# Patient Record
Sex: Female | Born: 1935 | Race: White | Hispanic: No | Marital: Married | Smoking: Never smoker
Health system: Southern US, Community
[De-identification: ages and names within clinical notes are randomized; demographics above are authoritative.]

## PROBLEM LIST (undated history)

## (undated) DIAGNOSIS — I4892 Unspecified atrial flutter: Secondary | ICD-10-CM

## (undated) DIAGNOSIS — N183 Chronic kidney disease, stage 3 unspecified: Secondary | ICD-10-CM

## (undated) DIAGNOSIS — H3530 Unspecified macular degeneration: Secondary | ICD-10-CM

## (undated) DIAGNOSIS — I480 Paroxysmal atrial fibrillation: Secondary | ICD-10-CM

## (undated) DIAGNOSIS — I2510 Atherosclerotic heart disease of native coronary artery without angina pectoris: Secondary | ICD-10-CM

## (undated) DIAGNOSIS — I5042 Chronic combined systolic (congestive) and diastolic (congestive) heart failure: Secondary | ICD-10-CM

## (undated) DIAGNOSIS — I4510 Unspecified right bundle-branch block: Secondary | ICD-10-CM

## (undated) DIAGNOSIS — E785 Hyperlipidemia, unspecified: Secondary | ICD-10-CM

## (undated) DIAGNOSIS — I350 Nonrheumatic aortic (valve) stenosis: Secondary | ICD-10-CM

## (undated) DIAGNOSIS — M1990 Unspecified osteoarthritis, unspecified site: Secondary | ICD-10-CM

## (undated) DIAGNOSIS — G8929 Other chronic pain: Secondary | ICD-10-CM

## (undated) DIAGNOSIS — D0512 Intraductal carcinoma in situ of left breast: Principal | ICD-10-CM

## (undated) DIAGNOSIS — R54 Age-related physical debility: Secondary | ICD-10-CM

## (undated) DIAGNOSIS — F0390 Unspecified dementia without behavioral disturbance: Secondary | ICD-10-CM

## (undated) DIAGNOSIS — F03A0 Unspecified dementia, mild, without behavioral disturbance, psychotic disturbance, mood disturbance, and anxiety: Secondary | ICD-10-CM

## (undated) DIAGNOSIS — I442 Atrioventricular block, complete: Secondary | ICD-10-CM

## (undated) DIAGNOSIS — I10 Essential (primary) hypertension: Secondary | ICD-10-CM

## (undated) HISTORY — DX: Atrioventricular block, complete: I44.2

## (undated) HISTORY — DX: Intraductal carcinoma in situ of left breast: D05.12

## (undated) HISTORY — DX: Unspecified osteoarthritis, unspecified site: M19.90

## (undated) HISTORY — DX: Unspecified macular degeneration: H35.30

## (undated) HISTORY — DX: Hyperlipidemia, unspecified: E78.5

## (undated) HISTORY — PX: PILONIDAL CYST EXCISION: SHX744

## (undated) HISTORY — DX: Age-related physical debility: R54

## (undated) HISTORY — PX: HERNIA REPAIR: SHX51

## (undated) HISTORY — DX: Unspecified atrial flutter: I48.92

## (undated) HISTORY — DX: Essential (primary) hypertension: I10

## (undated) HISTORY — DX: Unspecified right bundle-branch block: I45.10

## (undated) HISTORY — PX: PACEMAKER INSERTION: SHX728

## (undated) HISTORY — DX: Nonrheumatic aortic (valve) stenosis: I35.0

## (undated) HISTORY — PX: EYE SURGERY: SHX253

---

## 1998-06-12 ENCOUNTER — Other Ambulatory Visit: Admission: RE | Admit: 1998-06-12 | Discharge: 1998-06-12 | Payer: Self-pay | Admitting: *Deleted

## 1998-07-04 ENCOUNTER — Other Ambulatory Visit: Admission: RE | Admit: 1998-07-04 | Discharge: 1998-07-04 | Payer: Self-pay | Admitting: *Deleted

## 2001-01-29 ENCOUNTER — Other Ambulatory Visit: Admission: RE | Admit: 2001-01-29 | Discharge: 2001-01-29 | Payer: Self-pay | Admitting: *Deleted

## 2003-01-17 ENCOUNTER — Encounter: Payer: Self-pay | Admitting: Neurology

## 2003-01-17 ENCOUNTER — Encounter: Payer: Self-pay | Admitting: Emergency Medicine

## 2003-01-17 ENCOUNTER — Emergency Department (HOSPITAL_COMMUNITY): Admission: EM | Admit: 2003-01-17 | Discharge: 2003-01-17 | Payer: Self-pay | Admitting: Emergency Medicine

## 2004-01-05 ENCOUNTER — Encounter: Admission: RE | Admit: 2004-01-05 | Discharge: 2004-01-05 | Payer: Self-pay | Admitting: Orthopedic Surgery

## 2004-01-26 ENCOUNTER — Encounter: Admission: RE | Admit: 2004-01-26 | Discharge: 2004-01-26 | Payer: Self-pay | Admitting: Orthopedic Surgery

## 2004-02-17 ENCOUNTER — Encounter: Admission: RE | Admit: 2004-02-17 | Discharge: 2004-02-17 | Payer: Self-pay | Admitting: Orthopedic Surgery

## 2004-03-22 ENCOUNTER — Encounter: Admission: RE | Admit: 2004-03-22 | Discharge: 2004-03-22 | Payer: Self-pay | Admitting: Orthopedic Surgery

## 2004-11-20 ENCOUNTER — Encounter: Admission: RE | Admit: 2004-11-20 | Discharge: 2004-11-20 | Payer: Self-pay | Admitting: Orthopedic Surgery

## 2004-12-10 ENCOUNTER — Encounter: Admission: RE | Admit: 2004-12-10 | Discharge: 2004-12-10 | Payer: Self-pay | Admitting: Orthopedic Surgery

## 2004-12-25 ENCOUNTER — Encounter: Admission: RE | Admit: 2004-12-25 | Discharge: 2004-12-25 | Payer: Self-pay | Admitting: Orthopedic Surgery

## 2005-06-21 ENCOUNTER — Encounter: Admission: RE | Admit: 2005-06-21 | Discharge: 2005-06-21 | Payer: Self-pay | Admitting: Orthopedic Surgery

## 2005-07-05 ENCOUNTER — Ambulatory Visit: Payer: Self-pay | Admitting: Gastroenterology

## 2005-07-09 ENCOUNTER — Encounter: Admission: RE | Admit: 2005-07-09 | Discharge: 2005-07-09 | Payer: Self-pay | Admitting: Orthopedic Surgery

## 2005-07-22 ENCOUNTER — Ambulatory Visit: Payer: Self-pay | Admitting: Gastroenterology

## 2005-07-30 ENCOUNTER — Ambulatory Visit: Payer: Self-pay | Admitting: Gastroenterology

## 2005-08-02 ENCOUNTER — Ambulatory Visit: Payer: Self-pay | Admitting: Gastroenterology

## 2005-08-12 ENCOUNTER — Ambulatory Visit: Payer: Self-pay | Admitting: Gastroenterology

## 2005-09-02 ENCOUNTER — Ambulatory Visit: Payer: Self-pay | Admitting: Gastroenterology

## 2005-09-06 ENCOUNTER — Ambulatory Visit: Payer: Self-pay | Admitting: Gastroenterology

## 2005-09-10 ENCOUNTER — Ambulatory Visit: Payer: Self-pay | Admitting: Gastroenterology

## 2005-09-24 ENCOUNTER — Ambulatory Visit: Payer: Self-pay | Admitting: Gastroenterology

## 2006-04-21 ENCOUNTER — Encounter: Admission: RE | Admit: 2006-04-21 | Discharge: 2006-04-21 | Payer: Self-pay | Admitting: Orthopedic Surgery

## 2006-12-02 ENCOUNTER — Encounter: Admission: RE | Admit: 2006-12-02 | Discharge: 2006-12-02 | Payer: Self-pay | Admitting: Orthopedic Surgery

## 2006-12-25 ENCOUNTER — Encounter: Admission: RE | Admit: 2006-12-25 | Discharge: 2006-12-25 | Payer: Self-pay | Admitting: Orthopedic Surgery

## 2008-01-14 ENCOUNTER — Encounter: Admission: RE | Admit: 2008-01-14 | Discharge: 2008-01-14 | Payer: Self-pay | Admitting: Orthopedic Surgery

## 2008-02-02 ENCOUNTER — Encounter: Admission: RE | Admit: 2008-02-02 | Discharge: 2008-02-02 | Payer: Self-pay | Admitting: Orthopedic Surgery

## 2008-11-25 ENCOUNTER — Encounter: Admission: RE | Admit: 2008-11-25 | Discharge: 2008-11-25 | Payer: Self-pay | Admitting: Family Medicine

## 2009-06-08 ENCOUNTER — Encounter: Admission: RE | Admit: 2009-06-08 | Discharge: 2009-06-08 | Payer: Self-pay | Admitting: Family Medicine

## 2009-06-15 ENCOUNTER — Observation Stay (HOSPITAL_COMMUNITY): Admission: EM | Admit: 2009-06-15 | Discharge: 2009-06-15 | Payer: Self-pay | Admitting: Emergency Medicine

## 2010-03-27 ENCOUNTER — Ambulatory Visit: Payer: Self-pay | Admitting: Cardiovascular Disease

## 2010-08-29 ENCOUNTER — Ambulatory Visit (INDEPENDENT_AMBULATORY_CARE_PROVIDER_SITE_OTHER): Payer: Federal, State, Local not specified - PPO | Admitting: Nurse Practitioner

## 2010-08-29 ENCOUNTER — Inpatient Hospital Stay (HOSPITAL_COMMUNITY)
Admission: EM | Admit: 2010-08-29 | Discharge: 2010-08-31 | DRG: 244 | Disposition: A | Discharge status: Home / Self Care | Payer: Medicare Other | Attending: Cardiovascular Disease | Admitting: Cardiovascular Disease

## 2010-08-29 ENCOUNTER — Inpatient Hospital Stay: Admission: AD | Admit: 2010-08-29 | Payer: Self-pay | Source: Ambulatory Visit | Admitting: Cardiovascular Disease

## 2010-08-29 ENCOUNTER — Emergency Department (HOSPITAL_COMMUNITY): Payer: Medicare Other

## 2010-08-29 DIAGNOSIS — I4510 Unspecified right bundle-branch block: Secondary | ICD-10-CM | POA: Diagnosis present

## 2010-08-29 DIAGNOSIS — E785 Hyperlipidemia, unspecified: Secondary | ICD-10-CM | POA: Diagnosis present

## 2010-08-29 DIAGNOSIS — I359 Nonrheumatic aortic valve disorder, unspecified: Secondary | ICD-10-CM | POA: Diagnosis present

## 2010-08-29 DIAGNOSIS — I10 Essential (primary) hypertension: Secondary | ICD-10-CM | POA: Diagnosis present

## 2010-08-29 DIAGNOSIS — Z7982 Long term (current) use of aspirin: Secondary | ICD-10-CM

## 2010-08-29 DIAGNOSIS — I442 Atrioventricular block, complete: Principal | ICD-10-CM | POA: Diagnosis present

## 2010-08-29 DIAGNOSIS — R55 Syncope and collapse: Secondary | ICD-10-CM

## 2010-08-29 LAB — CBC
HCT: 38.6 % (ref 36.0–46.0)
Hemoglobin: 13 g/dL (ref 12.0–15.0)
RDW: 14.5 % (ref 11.5–15.5)
WBC: 8.9 10*3/uL (ref 4.0–10.5)

## 2010-08-29 LAB — CK TOTAL AND CKMB (NOT AT ARMC)
Relative Index: INVALID (ref 0.0–2.5)
Total CK: 67 U/L (ref 7–177)

## 2010-08-29 LAB — DIFFERENTIAL
Basophils Absolute: 0 10*3/uL (ref 0.0–0.1)
Eosinophils Relative: 2 % (ref 0–5)
Lymphocytes Relative: 16 % (ref 12–46)
Neutro Abs: 6.6 10*3/uL (ref 1.7–7.7)

## 2010-08-29 LAB — BASIC METABOLIC PANEL
Calcium: 9.5 mg/dL (ref 8.4–10.5)
Creatinine, Ser: 1.24 mg/dL — ABNORMAL HIGH (ref 0.4–1.2)
GFR calc Af Amer: 50 mL/min — ABNORMAL LOW (ref >60)
GFR calc non Af Amer: 41 mL/min — ABNORMAL LOW (ref >60)

## 2010-08-29 LAB — TROPONIN I: Troponin I: 0.03 ng/mL (ref 0.00–0.06)

## 2010-08-30 DIAGNOSIS — I442 Atrioventricular block, complete: Secondary | ICD-10-CM

## 2010-08-30 HISTORY — PX: INSERT / REPLACE / REMOVE PACEMAKER: SUR710

## 2010-08-30 LAB — BASIC METABOLIC PANEL
CO2: 25 mEq/L (ref 19–32)
Calcium: 9.3 mg/dL (ref 8.4–10.5)
Chloride: 106 mEq/L (ref 96–112)
Creatinine, Ser: 1.13 mg/dL (ref 0.4–1.2)
Glucose, Bld: 100 mg/dL — ABNORMAL HIGH (ref 70–99)

## 2010-08-30 LAB — CBC
HCT: 37.2 % (ref 36.0–46.0)
Hemoglobin: 12.3 g/dL (ref 12.0–15.0)
MCH: 28.5 pg (ref 26.0–34.0)
MCHC: 33.1 g/dL (ref 30.0–36.0)
RBC: 4.32 MIL/uL (ref 3.87–5.11)

## 2010-08-31 ENCOUNTER — Inpatient Hospital Stay (HOSPITAL_COMMUNITY): Payer: Medicare Other

## 2010-09-04 ENCOUNTER — Encounter: Payer: Self-pay | Admitting: Internal Medicine

## 2010-09-06 NOTE — Discharge Summary (Signed)
Erin Charles, Erin Charles             ACCOUNT NO.:  [id number]  MEDICAL RECORD NO.:  [id number]           PATIENT TYPE:  I  LOCATION:  2923                         FACILITY:  [facility]  PHYSICIAN:  Vesta Mixer, M.D. DATE OF BIRTH:  07-Oct-1922  DATE OF ADMISSION:  08/29/2010 DATE OF DISCHARGE:  08/31/2010                              DISCHARGE SUMMARY   PRIMARY CARDIOLOGIST:  Vesta Mixer, MD  ELECTROPHYSIOLOGIST:  Doylene Canning. Ladona Ridgel, MD  DISCHARGE DIAGNOSIS:  Complete heart block (symptomatic). a.  Status post St. Jude dual-chamber pacemaker on August 30, 2010.  SECONDARY DIAGNOSES: 1. Hypertension. 2. Dyslipidemia. 3. Aortic stenosis, severe.  ALLERGIES AND INTOLERANCES: 1. PENICILLINS (rash). 2. MACROLIDES (rash). 3. TOLMETIN (rash).  PROCEDURES: 1. EKG on August 29, 2010:  Marked sinus bradycardia with short PR     interval and right bundle-branch block, 40 bpm, no significant ST-T     wave changes, no significant Q-waves, normal axis, no evidence of     hypertrophy, PR 80, QRS 128, and QTc of 430. 2. Chest x-ray on August 29, 2010:  Mild cardiomegaly. 3. Insertion of St. Jude dual-chamber pacemaker on August 30, 2010. 4. Chest x-ray on August 31, 2010:  Left pacer with dual lead in right     atrium and right ventricle.  No pneumothorax.  Small bilateral     effusion. 5. EKG August 31, 2010:  A sensed, V paced, 73 bpm.  PR 186, QRS 154,     and QTc of 533.  HISTORY OF PRESENT ILLNESS:  Ms. Erin Charles is an 77 year old Caucasian female with the above-noted complex medical history who was noted to have 2:1 heart block and right bundle-branch block who presented to the hospital after a week of fatigue, subsequently found to have complete heart block with narrow ventricular escape at 32-36 bpm.  She subsequently was scheduled for permanent pacemaker insertion.  HOSPITAL COURSE:  The patient was admitted and underwent procedures as described above.  She tolerated them well  without significant complications.  After her pacemaker implantation, she was kept overnight for observation.  She was seen in the morning by her primary cardiologist and chest x-ray was reviewed as well as the pacemaker interrogation and the patient was deemed stable for discharge.  Her home medication list has not changed.  She will be seen for a wound check at [facility] on September 17, 2010, at 11:30 a.m.  She will follow up with her primary cardiologist as previously scheduled at [facility] on September 24, 2010.  She will then see her electrophysiologist for a 39-month postpacemaker implantation followup on December 05, 2010, at 3 p.m.  At the time of discharge, the patient received her old medication list, followup instructions, post pacemaker insertion instructions, and all questions and concerns were addressed prior to leaving the hospital.  DISCHARGE LABORATORY DATA:  WBC 7.8, HGB 12.3, HCT 37.2, and PLT count 188.  WBC differential on admission was within normal limits.  Sodium 140, potassium 3.5, chloride 106, bicarb 25, BUN 27, creatinine 1.13, and glucose 100.  Calcium 9.3.  Full set of enzymes negative.  TSH  1.502.  MRSA negative by PCR.  FOLLOWUP PLANS AND APPOINTMENTS:  Please see hospital course.  DISCHARGE MEDICATIONS: 1. Acetaminophen 325 mg 1-2 tablets p.o. q.4 h. p.r.n. (do not take     within 4 hours of home med, hydrocodone/APAP. 2. Accupril 40 mg 1 tablet p.o. b.i.d. 3. Alprazolam 0.25 mg 1 tablet p.o. q.8 h. P.r.n. 4. Enteric-coated aspirin 325 mg p.o. daily. 5. Hydrocodone/APAP 5/500 mg 1 tablet p.o. daily p.r.n. 6. Hydrochlorothiazide 25 mg 1 tablet p.o. daily. 7. Iron with vitamin C 1 tablet p.o. daily. 8. Lunesta 5 mg p.o. nightly p.r.n. 9. Premarin 0.3 mg 1 tablet p.o. daily. 10.Provera 2.5 mg 1 tablet p.o. daily. 11.Toprol-XL 50 mg 1 tablet p.o. daily. 12.Simvastatin 40 mg 1 tablet p.o. nightly.  DURATION OF DISCHARGE ENCOUNTER  INCLUDING PHYSICIAN TIME:  35 minutes.     Jarrett Ables, PAC   ______________________________ Vesta Mixer, M.D.    MS/MEDQ  D:  08/31/2010  T:  09/01/2010  Job:  [id number]  cc:   Doylene Canning. Ladona Ridgel, MD  Electronically Signed by Jarrett Ables PAC on 09/04/2010 04:46:03 PM Electronically Signed by Kristeen Miss M.D. on 09/05/2010 07:03:35 AM

## 2010-09-11 NOTE — Miscellaneous (Signed)
Summary: Device preload  Clinical Lists Changes  Observations: Added new observation of PPM INDICATN: CHB (09/04/2010 7:11) Added new observation of MAGNET RTE: BOL 100 ERI 85 (09/04/2010 7:11) Added new observation of PPMLEADSTAT2: active (09/04/2010 7:11) Added new observation of PPMLEADSER2: [id number] (09/04/2010 7:11) Added new observation of PPMLEADMOD2: 2088TC (09/04/2010 7:11) Added new observation of PPMLEADLOC2: RV (09/04/2010 7:11) Added new observation of PPMLEADSTAT1: active (09/04/2010 7:11) Added new observation of PPMLEADSER1: [id number] (09/04/2010 7:11) Added new observation of PPMLEADMOD1: 2088TC (09/04/2010 7:11) Added new observation of PPMLEADLOC1: RA (09/04/2010 7:11) Added new observation of PPM IMP MD: Lewayne Bunting, MD (09/04/2010 7:11) Added new observation of PPMLEADDOI2: 08/30/2010 (09/04/2010 7:11) Added new observation of PPMLEADDOI1: 08/30/2010 (09/04/2010 7:11) Added new observation of PPM DOI: 08/30/2010 (09/04/2010 7:11) Added new observation of PPM SERL#: [id number]  (09/04/2010 7:11) Added new observation of PPM MODL#: [id number]  (09/04/2010 7:84) Added new observation of PACEMAKERMFG: St Jude  (09/04/2010 7:11) Added new observation of PACEMAKER MD: Lewayne Bunting, MD  (09/04/2010 7:11)      PPM Specifications Following MD:  Lewayne Bunting, MD     PPM Vendor:  St Jude     PPM Model Number:  [id number]     PPM Serial Number:  [id number] PPM DOI:  08/30/2010     PPM Implanting MD:  Lewayne Bunting, MD  Lead 1    Location: RA     DOI: 08/30/2010     Model #: [id number]     Serial #: [id number]     Status: active Lead 2    Location: RV     DOI: 08/30/2010     Model #: [id number]     Serial #: [id number]     Status: active  Magnet Response Rate:  BOL 100 ERI 85  Indications:  CHB

## 2010-09-12 NOTE — Op Note (Signed)
NAMEMAMIE, HUNDERTMARK             ACCOUNT NO.:  [id number]  MEDICAL RECORD NO.:  [id number]           PATIENT TYPE:  I  LOCATION:  2923                         FACILITY:  [facility]  PHYSICIAN:  Doylene Canning. Ladona Ridgel, MD    DATE OF BIRTH:  25-Jul-1922  DATE OF PROCEDURE:  08/30/2010 DATE OF DISCHARGE:                              OPERATIVE REPORT   PROCEDURE PERFORMED:  Insertion of a dual-chamber pacemaker.  INDICATIONS:  Complete heart block (symptomatic).  INTRODUCTION:  The patient is an 77 year old woman who has a history of a 2:1 heart block and right bundle-branch block who presented to the hospital with 1 week of fatigue and weakness.  She was subsequently found to have complete heart block with a narrow ventricular escape at 32 to 36 beats per minute.  She is now referred for permanent pacemaker insertion.  PROCEDURE:  After informed consent was obtained, the patient was taken to the diagnostic EP lab in a fasting state.  After usual preparation and draping, intravenous fentanyl and midazolam was given for sedation. 30 mL of lidocaine was infiltrated into the left infraclavicular region. A 5-cm incision was carried out over this region.  Electrocautery was utilized to dissect down to the fascial plane.  The left subclavian vein was then punctured x2 and the St. Jude model 2080T 52-cm active fixation pacing lead, serial number [id number] was advanced into the right ventricle.  The St. Jude model 2088T 46-cm active fixation pacing lead serial number [id number] was advanced to right atrium.  Mapping was then carried out in the right ventricle.  At the final site on the RV apical septum, the R-waves measured 9 mV, pacing impedance was 740 ohms, and the threshold was less than a volt at 0.5 milliseconds.  10-volt pacing did not stimulate the diaphragm.  With the ventricular lead in satisfactory position, attention was then turned to placing the atrial lead, placed in the anterolateral  portion of the right atrium where P- waves measured 2 mV.  The pacing threshold was less than 1 volt at 0.5 milliseconds and the impedance was 670 ohms.  There was a large injury current with active fixation of both atrial and ventricular leads.  With the above leads in satisfactory position, it was secured to the subpectoralis fascia with a figure-of-eight silk suture.  Sewing sleeve was secured with silk suture.  Electrocautery was then utilized to make subcutaneous pocket.  Antibiotic irrigation was utilized to irrigate the pocket.  Electrocautery was utilized to assure hemostasis.  The St. Jude Accent DR/RF dual-chamber pacemaker serial number [id number] was connected to the atrial and ventricular leads and placed back in the subcutaneous pocket where it was secured with silk suture.  The pocket was again irrigated with antibiotic solution and the incision closed with 2-0 and 3-0 Vicryl.  Benzoin and Steri-Strips were painted on the skin.  A pressure dressing was applied and the patient was returned to her room in satisfactory condition.  COMPLICATIONS:  There were no immediate procedural complications.  RESULTS:  This demonstrates successful implantation of the St. Jude dual- chamber pacemaker.  The patient was symptomatic with complete  heart block.     Doylene Canning. Ladona Ridgel, MD     GWT/MEDQ  D:  08/30/2010  T:  08/31/2010  Job:  [id number]  cc:   Caryn Bee L. Little, M.D. Vesta Mixer, M.D.  Electronically Signed by Lewayne Bunting MD on 09/12/2010 04:38:31 PM

## 2010-09-17 ENCOUNTER — Encounter: Payer: Self-pay | Admitting: Internal Medicine

## 2010-09-17 ENCOUNTER — Ambulatory Visit (INDEPENDENT_AMBULATORY_CARE_PROVIDER_SITE_OTHER): Payer: Federal, State, Local not specified - PPO

## 2010-09-17 DIAGNOSIS — I495 Sick sinus syndrome: Secondary | ICD-10-CM

## 2010-09-20 ENCOUNTER — Encounter: Payer: Self-pay | Admitting: Cardiovascular Disease

## 2010-09-27 ENCOUNTER — Ambulatory Visit (INDEPENDENT_AMBULATORY_CARE_PROVIDER_SITE_OTHER): Payer: Federal, State, Local not specified - PPO | Admitting: Cardiovascular Disease

## 2010-09-27 ENCOUNTER — Encounter: Payer: Self-pay | Admitting: Cardiovascular Disease

## 2010-09-27 VITALS — BP 160/68 | HR 52 | Wt 138.0 lb

## 2010-09-27 DIAGNOSIS — I359 Nonrheumatic aortic valve disorder, unspecified: Secondary | ICD-10-CM

## 2010-09-27 DIAGNOSIS — I119 Hypertensive heart disease without heart failure: Secondary | ICD-10-CM

## 2010-09-27 DIAGNOSIS — I350 Nonrheumatic aortic (valve) stenosis: Secondary | ICD-10-CM | POA: Insufficient documentation

## 2010-09-27 DIAGNOSIS — Z950 Presence of cardiac pacemaker: Secondary | ICD-10-CM | POA: Insufficient documentation

## 2010-09-27 DIAGNOSIS — I10 Essential (primary) hypertension: Secondary | ICD-10-CM | POA: Insufficient documentation

## 2010-09-27 MED ORDER — QUINAPRIL HCL 40 MG PO TABS: 40.0000 mg | ORAL_TABLET | Freq: Two times a day (BID) | ORAL | Status: DC

## 2010-09-27 NOTE — Assessment & Plan Note (Signed)
Stable

## 2010-09-27 NOTE — Assessment & Plan Note (Signed)
Since presents for followup of her pacemaker. She's doing fairly well. The pacemaker site is clean and dry. She will be followed in pacemaker clinic.

## 2010-09-27 NOTE — Procedures (Signed)
Summary: wound check/st jude/ per matt call 08/31/10=mj  Medications Added TOPROL XL 50 MG XR24H-TAB (METOPROLOL SUCCINATE) Take 1 tablet by mouth once daily ACCUPRIL 40 MG TABS (QUINAPRIL HCL) Take 1 tablet by mouth two times a day HYDROCHLOROTHIAZIDE 25 MG TABS (HYDROCHLOROTHIAZIDE) Take 1 tablet by mouth once daily ASPIR-LOW 81 MG TBEC (ASPIRIN) Take 1 tablet by mouth once daily PREMARIN 0.3 MG TABS (ESTROGENS CONJUGATED) Take 1 tablet by mouth once daily PROVERA 2.5 MG TABS (MEDROXYPROGESTERONE ACETATE) Take 1 tablet by mouth once daily ZOCOR 40 MG TABS (SIMVASTATIN) Take 1 tablet by mouth at bedtime HYDROCODONE-ACETAMINOPHEN 5-500 MG TABS (HYDROCODONE-ACETAMINOPHEN) Take as needed for arthritic pain NORTRIPTYLINE HCL 10 MG CAPS (NORTRIPTYLINE HCL) Take 1 capsule by mouth once daily LUNESTA 3 MG TABS (ESZOPICLONE) Take 1 tablet by mouth at bedtime ALPRAZOLAM 0.25 MG TABS (ALPRAZOLAM) Take 1 tablet by mouth at bedtime      Allergies Added: ! PCN ! MACRODANTIN ! TOLECTIN  Current Medications (verified): 1)  Toprol Xl 50 Mg Xr24h-Tab (Metoprolol Succinate) .... Take 1 Tablet By Mouth Once Daily 2)  Accupril 40 Mg Tabs (Quinapril Hcl) .... Take 1 Tablet By Mouth Two Times A Day 3)  Hydrochlorothiazide 25 Mg Tabs (Hydrochlorothiazide) .... Take 1 Tablet By Mouth Once Daily 4)  Aspir-Low 81 Mg Tbec (Aspirin) .... Take 1 Tablet By Mouth Once Daily 5)  Premarin 0.3 Mg Tabs (Estrogens Conjugated) .... Take 1 Tablet By Mouth Once Daily 6)  Provera 2.5 Mg Tabs (Medroxyprogesterone Acetate) .... Take 1 Tablet By Mouth Once Daily 7)  Zocor 40 Mg Tabs (Simvastatin) .... Take 1 Tablet By Mouth At Bedtime 8)  Hydrocodone-Acetaminophen 5-500 Mg Tabs (Hydrocodone-Acetaminophen) .... Take As Needed For Arthritic Pain 9)  Nortriptyline Hcl 10 Mg Caps (Nortriptyline Hcl) .... Take 1 Capsule By Mouth Once Daily 10)  Lunesta 3 Mg Tabs (Eszopiclone) .... Take 1 Tablet By Mouth At Bedtime 11)  Alprazolam  0.25 Mg Tabs (Alprazolam) .... Take 1 Tablet By Mouth At Bedtime  Allergies (verified): 1)  ! Pcn 2)  ! Macrodantin 3)  ! Tolectin  PPM Specifications Following MD:  Lewayne Bunting, MD     PPM Vendor:  St Jude     PPM Model Number:  [id number]     PPM Serial Number:  [id number] PPM DOI:  08/30/2010     PPM Implanting MD:  Lewayne Bunting, MD  Lead 1    Location: RA     DOI: 08/30/2010     Model #: [id number]     Serial #: [id number]     Status: active Lead 2    Location: RV     DOI: 08/30/2010     Model #: [id number]     Serial #: [id number]     Status: active  Magnet Response Rate:  BOL 100 ERI 85  Indications:  CHB  Tech Comments:  meds reviewed. see paceart report. Vella Kohler  September 17, 2010 12:31 PM   Patient Instructions: 1)  Your physician recommends that you schedule a follow-up appointment in: 12-18-10 @ 3pm with Dr Ladona Ridgel.

## 2010-09-27 NOTE — Progress Notes (Signed)
History of Present Illness:   Current Outpatient Prescriptions on File Prior to Visit  Medication Sig Dispense Refill  . ALPRAZolam (XANAX) 0.25 MG tablet Take 0.25 mg by mouth every 8 (eight) hours as needed.        Marland Kitchen aspirin 325 MG tablet Take 325 mg by mouth daily.        Marland Kitchen estrogens, conjugated, (PREMARIN) 0.3 MG tablet Take 0.3 mg by mouth daily. Take daily for 21 days then do not take for 7 days.       . hydrochlorothiazide 25 MG tablet Take 25 mg by mouth daily.       . hydrocodone-acetaminophen (LORCET-HD) 5-500 MG per capsule Take 1 capsule by mouth as needed.        Marland Kitchen IRON-VITAMIN C PO Take 1 tablet by mouth daily.        . medroxyPROGESTERone (PROVERA) 2.5 MG tablet Take 2.5 mg by mouth daily.        . metoprolol (TOPROL-XL) 50 MG 24 hr tablet Take 50 mg by mouth daily.       . simvastatin (ZOCOR) 40 MG tablet Take 40 mg by mouth at bedtime.       . Eszopiclone (LUNESTA PO) Take 1 tablet by mouth at bedtime as needed.        . ISOtretinoin (ACCUTANE) 40 MG capsule Take 40 mg by mouth 2 (two) times daily.        accupril 40 mg twice a day  Allergies  Allergen Reactions  . Macrodantin   . Nitrofurantoin   . Penicillins   . Tolectin (Tolmetin Sodium)     Past Medical History  Diagnosis Date  . HTN (hypertension)   . Dyslipidemia   . Severe aortic stenosis   . Complete heart block   . RBBB (right bundle branch block)     Past Surgical History  Procedure Date  . Insert / replace / remove pacemaker 08/30/10    DUAL CHAMBER/ST. JUDE  . Pilonidal cyst excision   . Hernia repair     History  Smoking status  . Never Smoker   Smokeless tobacco  . Not on file    History  Alcohol Use No    No family history on file.  Review of Systems: The review of systems is Reviewed in the history present illness.  All other systems were reviewed and are negative.  Physical Exam: BP 160/68  Pulse 52  Wt 138 lb (62.596 kg) The patient is alert and oriented x 3.  The mood  and affect are normal.  The skin is warm and dry.  Color is normal.  The HEENT exam reveals that the sclera are nonicteric.  The mucous membranes are moist.  The carotids are 2+ without bruits.  There is no thyromegaly.  There is no JVD.  The lungs are clear.  The chest wall is non tender.  The heart exam reveals a regular rate with a normal S1 and S2.  There is a soft systolic murmur.  Her pacemaker site is clean and well-healed. There is a small suture coming from the medial aspect of the wound. This suture was based with Betadine and was clipped off at the skin.  The PMI is not displaced.   Abdominal exam reveals good bowel sounds.  There is no guarding or rebound.  There is no hepatosplenomegaly or tenderness.  There are no masses.  Exam of the legs reveal no clubbing, cyanosis, or edema.  The legs are without  rashes.  The distal pulses are intact.  Cranial nerves II - XII are intact.  Motor and sensory functions are intact.  The gait is normal.  Assessment / Plan:

## 2010-09-27 NOTE — Patient Instructions (Signed)
Avoid salt

## 2010-09-27 NOTE — Assessment & Plan Note (Signed)
Her blood pressure is moderately elevated today. She has not been watching her salt intake. Will have her limit her salt intake. I'll see her again in 3 months. If her blood pressure remains elevated then we'll need to change her Accupril to a stronger medication.

## 2010-10-01 LAB — CARDIAC PANEL(CRET KIN+CKTOT+MB+TROPI)
CK, MB: 5.4 ng/mL — ABNORMAL HIGH (ref 0.3–4.0)
CK, MB: 6.8 ng/mL — ABNORMAL HIGH (ref 0.3–4.0)
Relative Index: 5.4 — ABNORMAL HIGH (ref 0.0–2.5)
Total CK: 125 U/L (ref 7–177)
Total CK: 132 U/L (ref 7–177)
Troponin I: 0.03 ng/mL (ref 0.00–0.06)

## 2010-10-01 LAB — LIPID PANEL
Cholesterol: 153 mg/dL (ref 0–200)
LDL Cholesterol: 54 mg/dL (ref 0–99)
Total CHOL/HDL Ratio: 2.1 RATIO
VLDL: 25 mg/dL (ref 0–40)

## 2010-10-01 LAB — HEMOGLOBIN A1C: Mean Plasma Glucose: 128 mg/dL

## 2010-10-01 LAB — CK TOTAL AND CKMB (NOT AT ARMC): Relative Index: 5 — ABNORMAL HIGH (ref 0.0–2.5)

## 2010-10-02 LAB — BASIC METABOLIC PANEL
BUN: 22 mg/dL (ref 6–23)
CO2: 25 mEq/L (ref 19–32)
GFR calc non Af Amer: 60 mL/min — ABNORMAL LOW (ref >60)
Glucose, Bld: 163 mg/dL — ABNORMAL HIGH (ref 70–99)
Potassium: 3.4 mEq/L — ABNORMAL LOW (ref 3.5–5.1)
Sodium: 137 mEq/L (ref 135–145)

## 2010-10-02 LAB — HEPATIC FUNCTION PANEL
ALT: 21 U/L (ref 0–35)
AST: 26 U/L (ref 0–37)
Albumin: 4 g/dL (ref 3.5–5.2)
Alkaline Phosphatase: 77 U/L (ref 39–117)
Total Bilirubin: 0.4 mg/dL (ref 0.3–1.2)

## 2010-10-02 LAB — CBC
HCT: 38.7 % (ref 36.0–46.0)
Hemoglobin: 13.1 g/dL (ref 12.0–15.0)
MCHC: 33.8 g/dL (ref 30.0–36.0)
MCV: 89.7 fL (ref 78.0–100.0)
Platelets: 251 10*3/uL (ref 150–400)
RDW: 15.6 % — ABNORMAL HIGH (ref 11.5–15.5)

## 2010-10-02 LAB — URINALYSIS, ROUTINE W REFLEX MICROSCOPIC
Nitrite: NEGATIVE
Specific Gravity, Urine: 1.021 (ref 1.005–1.030)
pH: 5.5 (ref 5.0–8.0)

## 2010-10-02 LAB — URINE CULTURE

## 2010-10-02 LAB — DIFFERENTIAL
Basophils Absolute: 0 10*3/uL (ref 0.0–0.1)
Basophils Relative: 0 % (ref 0–1)
Eosinophils Absolute: 0.1 10*3/uL (ref 0.0–0.7)
Eosinophils Relative: 1 % (ref 0–5)
Monocytes Absolute: 0.9 10*3/uL (ref 0.1–1.0)

## 2010-10-02 LAB — POCT CARDIAC MARKERS: Troponin i, poc: <0.05 ng/mL (ref 0.00–0.09)

## 2010-10-02 LAB — URINE MICROSCOPIC-ADD ON

## 2010-10-02 LAB — PROTIME-INR: Prothrombin Time: 12.9 seconds (ref 11.6–15.2)

## 2010-11-16 NOTE — Consult Note (Signed)
Erin Charles, Erin Charles                         ACCOUNT NO.:  [id number]   MEDICAL RECORD NO.:  [id number]                   PATIENT TYPE:  EMS   LOCATION:  ED                                   FACILITY:  [facility]   PHYSICIAN:  Gustavus Messing. Orlin Hilding, M.D.          DATE OF BIRTH:  1923-02-21   DATE OF CONSULTATION:  01/17/2003  DATE OF DISCHARGE:                                   CONSULTATION   PRIMARY PHYSICIAN:  The patient's primary physician, I believe, is Dr.  Wylene Simmer.   CHIEF COMPLAINT:  Left arm numbness intermittently.   HISTORY OF PRESENT ILLNESS:  Erin Charles is a 77 year old right-handed white  woman with a history of hyperlipidemia and hypertension, both controlled.  She presents now with a six-day history of intermittent left arm numbness.  This has been present since Wednesday of last week, occurring for just a few  minutes at a time, four to five minutes at a time, three to four times a day  in a strip-like distribution from her shoulder to her wrist, not  circumferential.  There is no involvement of face, torso, or leg.  No  weakness.  No symptoms on the opposite side.  No speech or language or  vision problems.   REVIEW OF SYSTEMS:  Positive for occasional headaches but not associated  with the current symptom complex.  No chest pain, no shortness of breath, no  palpitations.  She had the symptoms occur this morning about 6 o'clock and  then none all day until about 6 p.m. for three to four minutes while she was  here in the emergency room with no clear change.   PAST MEDICAL HISTORY:  Significant for:  1. Hyperlipidemia.  2. Hypertension.  3. Postmenopausal.  4. She has had a pilonidal cyst removed.  5. She had hernia surgery.  6. Arthritis.  7. She has a known history of right bundle branch block and a leaky valve.  8. She uses prophylaxis before dental work, but there is no history of     rheumatic fever.   CURRENT MEDICATIONS:  Accupril, Celebrex, Zocor,  Premarin, Provera, Fioricet  p.r.n., and aspirin p.r.n.  She was not taking aspirin prior to these  current symptoms.   ALLERGIES:  PENICILLIN, TOLECTIN, and MACRODANTIN.   SOCIAL HISTORY:  No cigarette or alcohol use.  She has just retired as a  Neurosurgeon.   FAMILY HISTORY:  Positive for stroke.   PHYSICAL EXAMINATION:  VITAL SIGNS:  Temperature is 98, pulse 100,  respirations 20, BP 153/78, 87% saturation.  HEENT:  Head is normocephalic, atraumatic.  NECK:  Supple.  Without bruits.  HEART:  Regular rate and rhythm.  Without murmurs.  EXTREMITIES:  Without edema.  NEUROLOGIC:  Mental status:  She is awake, alert, and appropriate, with  normal language, with normal naming, comprehension, and repetition.  Cranial  nerves:  Pupils are equal and reactive.  Disc margins  are sharp.  Visual  fields are full to confrontation.  Extraocular movements are intact, without  nystagmus, ophthalmia, paresis, or ptosis.  Facial sensation is normal.  Facial motor activity is intact, without weakness or asymmetry.  Hearing is  intact.  Palate is symmetric.  Tongue is midline.  She has normal shoulder  shrug on motor exam.  She has normal bulk, tone, and strength throughout  with 5/5 strength in all four extremities.  No drift or satelliting.  No  fasciculations, atrophy, or tremor.  Reflexes are 1+ and symmetric, with  downgoing toes to plantar stimulation.  Coordination:  Finger-to-nose, rapid  alternating movements, and heel-to-shin are normal.  Sensory exam is intact.   LABORATORY DATA:  MRI scan of the brain shows no acute abnormalities by  diffuse weighted imaging but mild to moderate small-vessel disease.  CT was  normal.   EKG shows right bundle branch block and borderline left ventricular  hypertrophy, which are known entities.   Laboratories are unremarkable.   IMPRESSION:  Persistent recurrent left arm numbness.  Duration of spell is a  few minutes at a time and frequency three  to four times a day.  The total  duration of the episode is six days, and the stereopathy of it, together  with the distribution, which is somewhat radicular, is atypical for  transient ischemic attack or stroke, although I cannot entirely rule out  transient ischemic attack.  There is no stroke by diffuse weighted imaging  MRI.  I doubt this is seizure.  It could be radicular or focal neuropathic.  It is possible this is a very small, tiny end vessel fluctuation which may  result in some permanent numbness, but it is somewhat atypical even for  that.   RECOMMENDATION:  Discussed admitting the patient for workup versus an  outpatient workup since the MRI was negative.  She declines admission at  this time.  Will have her take the aspirin 325 mg daily and arrange for an  outpatient 2-D echo and carotid Doppler.  If those are negative, we may need  to do MR angiogram of the intracranial vessels as well as MRI of the  cervical spine.  She may need EEG to rule out focal seizure activity, may  need nerve conduction EMG to rule out a peripheral or radicular process.  She has been instructed, however, to return to the emergency room for  admission if spells of greater duration occur.                                               Catherine A. Orlin Hilding, M.D.    CAW/MEDQ  D:  01/17/2003  T:  01/17/2003  Job:  [id number]

## 2010-12-18 ENCOUNTER — Encounter: Payer: Self-pay | Admitting: Internal Medicine

## 2010-12-18 ENCOUNTER — Ambulatory Visit (INDEPENDENT_AMBULATORY_CARE_PROVIDER_SITE_OTHER): Payer: Federal, State, Local not specified - PPO | Admitting: Internal Medicine

## 2010-12-18 DIAGNOSIS — I10 Essential (primary) hypertension: Secondary | ICD-10-CM

## 2010-12-18 DIAGNOSIS — I442 Atrioventricular block, complete: Secondary | ICD-10-CM

## 2010-12-18 DIAGNOSIS — Z950 Presence of cardiac pacemaker: Secondary | ICD-10-CM

## 2010-12-18 NOTE — Assessment & Plan Note (Signed)
Her blood pressure is elevated today. She notes that at home and is under better control. I have encouraged her to maintain a low-sodium diet.

## 2010-12-18 NOTE — Assessment & Plan Note (Signed)
Her device is working normally. We'll recheck in several months. 

## 2010-12-18 NOTE — Progress Notes (Signed)
HPI Erin Charles returns today for followup. She is an 77 year old woman with a history of symptomatic tachybradycardia syndrome, paroxysmal atrial arrhythmias, status post permanent pacemaker insertion. Since her pacemaker she has done well. She denies syncope. She is fairly sedentary secondary to back problems but she notes that she had in the pool yesterday from a 2 hours. She denies chest pain or shortness of breath. No peripheral edema. No syncope. Allergies  Allergen Reactions  . Macrodantin   . Nitrofurantoin   . Penicillins   . Tolectin (Tolmetin Sodium)      Current Outpatient Prescriptions  Medication Sig Dispense Refill  . ALPRAZolam (XANAX) 0.25 MG tablet Take 0.25 mg by mouth every 8 (eight) hours as needed.        Marland Kitchen aspirin 325 MG tablet Take 325 mg by mouth daily.        Marland Kitchen estrogens, conjugated, (PREMARIN) 0.3 MG tablet Take 0.3 mg by mouth daily. Take daily for 21 days then do not take for 7 days.       . Eszopiclone (LUNESTA PO) Take 1 tablet by mouth at bedtime as needed.        . hydrochlorothiazide 25 MG tablet Take 25 mg by mouth daily.       . hydrocodone-acetaminophen (LORCET-HD) 5-500 MG per capsule Take 1 capsule by mouth as needed.        Marland Kitchen IRON-VITAMIN C PO Take 1 tablet by mouth daily.        . medroxyPROGESTERone (PROVERA) 2.5 MG tablet Take 2.5 mg by mouth daily.        . metoprolol (TOPROL-XL) 50 MG 24 hr tablet Take 50 mg by mouth daily.       . quinapril (ACCUPRIL) 40 MG tablet Take 1 tablet (40 mg total) by mouth 2 (two) times daily at 10 AM and 5 PM.  60 tablet  11  . simvastatin (ZOCOR) 40 MG tablet Take 40 mg by mouth at bedtime.       Marland Kitchen DISCONTD: ISOtretinoin (ACCUTANE) 40 MG capsule Take 40 mg by mouth 2 (two) times daily.           Past Medical History  Diagnosis Date  . HTN (hypertension)   . Dyslipidemia   . Severe aortic stenosis   . Complete heart block   . RBBB (right bundle branch block)     ROS:   All systems reviewed and negative  except as noted in the HPI.   Past Surgical History  Procedure Date  . Insert / replace / remove pacemaker 08/30/10    DUAL CHAMBER/ST. JUDE  . Pilonidal cyst excision   . Hernia repair      No family history on file.   History   Social History  . Marital Status: Married    Spouse Name: N/A    Number of Children: N/A  . Years of Education: N/A   Occupational History  . Not on file.   Social History Main Topics  . Smoking status: Never Smoker   . Smokeless tobacco: Never Used  . Alcohol Use: No  . Drug Use: No  . Sexually Active: Not on file   Other Topics Concern  . Not on file   Social History Narrative  . No narrative on file     BP 150/70  Pulse 76  Ht 5' (1.524 m)  Wt 142 lb 12.8 oz (64.774 kg)  BMI 27.89 kg/m2  Physical Exam:  Well appearing NAD HEENT: Unremarkable Neck:  No  JVD, no thyromegally Lymphatics:  No adenopathy Back:  No CVA tenderness Lungs:  Clear HEART:  Regular rate rhythm, 2/6 murmur of AS, no rubs, no clicks Abd:  Flat, positive bowel sounds, no organomegally, no rebound, no guarding Ext:  2 plus pulses, no edema, no cyanosis, no clubbing Skin:  No rashes no nodules Neuro:  CN II through XII intact, motor grossly intact   DEVICE  Normal device function.  See PaceArt for details.   Assess/Plan:

## 2010-12-18 NOTE — Patient Instructions (Signed)
Your physician wants you to follow-up in: 12 months with Dr. Taylor You will receive a reminder letter in the mail two months in advance. If you don't receive a letter, please call our office to schedule the follow-up appointment.  Your physician recommends that you continue on your current medications as directed. Please refer to the Current Medication list given to you today.     

## 2010-12-25 ENCOUNTER — Other Ambulatory Visit: Payer: Self-pay | Admitting: *Deleted

## 2010-12-25 DIAGNOSIS — Z79899 Other long term (current) drug therapy: Secondary | ICD-10-CM

## 2010-12-26 ENCOUNTER — Other Ambulatory Visit (INDEPENDENT_AMBULATORY_CARE_PROVIDER_SITE_OTHER): Payer: Federal, State, Local not specified - PPO | Admitting: *Deleted

## 2010-12-26 ENCOUNTER — Encounter: Payer: Self-pay | Admitting: Cardiovascular Disease

## 2010-12-26 ENCOUNTER — Ambulatory Visit (INDEPENDENT_AMBULATORY_CARE_PROVIDER_SITE_OTHER): Payer: Federal, State, Local not specified - PPO | Admitting: Cardiovascular Disease

## 2010-12-26 DIAGNOSIS — I10 Essential (primary) hypertension: Secondary | ICD-10-CM

## 2010-12-26 DIAGNOSIS — Z79899 Other long term (current) drug therapy: Secondary | ICD-10-CM

## 2010-12-26 DIAGNOSIS — I350 Nonrheumatic aortic (valve) stenosis: Secondary | ICD-10-CM

## 2010-12-26 DIAGNOSIS — I359 Nonrheumatic aortic valve disorder, unspecified: Secondary | ICD-10-CM

## 2010-12-26 LAB — BASIC METABOLIC PANEL
CO2: 27 mEq/L (ref 19–32)
Calcium: 9.5 mg/dL (ref 8.4–10.5)
Chloride: 103 mEq/L (ref 96–112)
Sodium: 139 mEq/L (ref 135–145)

## 2010-12-26 NOTE — Patient Instructions (Signed)
Continue to watch your salt.

## 2010-12-26 NOTE — Assessment & Plan Note (Signed)
Erin Charles is doing fairly well. Her last echocardiogram in 2010 revealed moderate to severe aortic stenosis. She remains asymptomatic. At age 77, I would be inclined to delay any surgery as long as possible. We discussed the possibility that she may be able to have a trans-catheter aortic valve replacement.  As he becomes more symptomatic this may be a good option for her. At present she remains fairly asymptomatic. Her carotid pulses are little bit delayed and we will continue to keep and eye on  that.

## 2010-12-26 NOTE — Progress Notes (Signed)
Colon Branch Date of Birth  01-19-1923 [facility] [address]     [address] [place], [place]  [address] [phone]  Fax  [phone]  History of Present Illness:  77 yo female with aortic stenosis.Not having any dyspnea or cp.  She has been eating a bit too salt and her BP was elevated at Dr. Bruna Potter office recently.  Current Outpatient Prescriptions on File Prior to Visit  Medication Sig Dispense Refill  . ALPRAZolam (XANAX) 0.25 MG tablet Take 0.25 mg by mouth every 8 (eight) hours as needed.        Marland Kitchen aspirin 325 MG tablet Take 325 mg by mouth daily.        Marland Kitchen estrogens, conjugated, (PREMARIN) 0.3 MG tablet Take 0.3 mg by mouth daily. Take daily for 21 days then do not take for 7 days.       . Eszopiclone (LUNESTA PO) Take 1 tablet by mouth at bedtime as needed.        . hydrochlorothiazide 25 MG tablet Take 25 mg by mouth daily.       . hydrocodone-acetaminophen (LORCET-HD) 5-500 MG per capsule Take 1 capsule by mouth as needed.        Marland Kitchen IRON-VITAMIN C PO Take 1 tablet by mouth daily.        . medroxyPROGESTERone (PROVERA) 2.5 MG tablet Take 2.5 mg by mouth daily.        . metoprolol (TOPROL-XL) 50 MG 24 hr tablet Take 50 mg by mouth daily.       . quinapril (ACCUPRIL) 40 MG tablet Take 1 tablet (40 mg total) by mouth 2 (two) times daily at 10 AM and 5 PM.  60 tablet  11  . simvastatin (ZOCOR) 40 MG tablet Take 40 mg by mouth at bedtime.         Allergies  Allergen Reactions  . Macrodantin   . Nitrofurantoin   . Penicillins   . Tolectin (Tolmetin Sodium)     Past Medical History  Diagnosis Date  . HTN (hypertension)   . Dyslipidemia   . Severe aortic stenosis   . Complete heart block   . RBBB (right bundle branch block)     Past Surgical History  Procedure Date  . Insert / replace / remove pacemaker 08/30/10    DUAL CHAMBER/ST. JUDE  . Pilonidal cyst excision   . Hernia repair     History  Smoking status  . Never  Smoker   Smokeless tobacco  . Never Used    History  Alcohol Use No    No family history on file.  Reviw of Systems:  Reviewed in the HPI.  All other systems are negative.  Physical Exam: BP 130/62  Pulse 84  Ht 5' (1.524 m)  Wt 141 lb 6.4 oz (64.139 kg)  BMI 27.62 kg/m2 The patient is alert and oriented x 3.  The mood and affect are normal.   Skin: warm and dry.  Color is normal.    HEENT:   the sclera are nonicteric.  The mucous membranes are moist.  The carotids are 2+ without bruits.  There is no thyromegaly.  There is no JVD.    Lungs: clear.  The chest wall is non tender.    Heart: regular rate with a normal S1 and S2.  There is a 2-3/6 systolic murmur. The PMI is not displaced.     Abdomin: good bowel sounds.  There is no guarding  or rebound.  There is no hepatosplenomegaly or tenderness.  There are no masses.   Extremities:  no clubbing, cyanosis, or edema.  The legs are without rashes.  The distal pulses are slightly diminished.   Neuro:  Cranial nerves II - XII are intact.  Motor and sensory functions are intact.    The gait is normal.  ECG:  Assessment / Plan:

## 2010-12-26 NOTE — Assessment & Plan Note (Signed)
Her blood pressure is much better since Dr. Ladona Ridgel decrease some of her sodium in her diet.

## 2011-01-01 NOTE — Progress Notes (Signed)
Pt called with results and forwarded to pcp

## 2011-03-20 ENCOUNTER — Telehealth: Payer: Self-pay | Admitting: Cardiovascular Disease

## 2011-03-20 MED ORDER — METOPROLOL SUCCINATE ER 50 MG PO TB24: 50.0000 mg | ORAL_TABLET | Freq: Every day | ORAL | Status: DC

## 2011-03-20 NOTE — Telephone Encounter (Signed)
Pt returned call script refilled to both pharmacies.

## 2011-03-20 NOTE — Telephone Encounter (Signed)
Pt has refill of toprol called in to [facility] but she out of pills. She wanted know if 10 pills could be called to [facility]. Please call.

## 2011-03-27 ENCOUNTER — Telehealth: Payer: Self-pay | Admitting: Cardiovascular Disease

## 2011-03-27 NOTE — Telephone Encounter (Signed)
Called, left msg i will call back

## 2011-03-27 NOTE — Telephone Encounter (Signed)
Tried to contact.  No answer.

## 2011-03-27 NOTE — Telephone Encounter (Signed)
Pt returning call to [place]. Please call back.

## 2011-03-27 NOTE — Telephone Encounter (Signed)
Pt calling wanting to ask pt about the [employer] machine, in connection with pacemaker pt has. Please return call to discuss further.

## 2011-03-27 NOTE — Telephone Encounter (Signed)
Called pt left msg again, i will call back.

## 2011-03-27 NOTE — Telephone Encounter (Signed)
Called no answer/msg left to call back if need something.

## 2011-03-27 NOTE — Telephone Encounter (Signed)
Please call pt wouldn't tell me why

## 2011-03-27 NOTE — Telephone Encounter (Signed)
Pt contacted and informed she needs to call dr taylor in regards to her pacemaker, Pt verbalized understanding. Alfonso Ramus RN

## 2011-03-27 NOTE — Telephone Encounter (Signed)
Returned call, no answer left msg i will call back

## 2011-04-25 ENCOUNTER — Telehealth: Payer: Self-pay | Admitting: *Deleted

## 2011-04-25 ENCOUNTER — Telehealth: Payer: Self-pay | Admitting: Cardiovascular Disease

## 2011-04-25 NOTE — Telephone Encounter (Signed)
Crystal from Dr Ephriam Knuckles office called regarding patient.  She has a retinal hemorrhage and needs surgical clearance for tomorrow.  [facility] office and asked that this be sent to him.  Dr Rankin's office number [phone], fax [phone]

## 2011-04-25 NOTE — Telephone Encounter (Signed)
Dr. Fawn Kirk [phone]) called to get surgical clearance for Ms. Erin Charles.  She has aortic stenosis but is asymptomatic. He needs to do eye surgery - will not need general anesthesia.  She will not have any  Significant blood loss.  She is at low risk for her emergent eye surgery tomorrow.  Delane Ginger, MD

## 2011-04-25 NOTE — Telephone Encounter (Signed)
Done.  See telephone note.

## 2011-06-17 ENCOUNTER — Encounter: Payer: Self-pay | Admitting: Cardiovascular Disease

## 2011-06-17 ENCOUNTER — Ambulatory Visit (INDEPENDENT_AMBULATORY_CARE_PROVIDER_SITE_OTHER): Payer: Federal, State, Local not specified - PPO | Admitting: Cardiovascular Disease

## 2011-06-17 VITALS — BP 178/75 | HR 74 | Ht <= 58 in | Wt 144.1 lb

## 2011-06-17 DIAGNOSIS — I10 Essential (primary) hypertension: Secondary | ICD-10-CM

## 2011-06-17 DIAGNOSIS — Z950 Presence of cardiac pacemaker: Secondary | ICD-10-CM

## 2011-06-17 DIAGNOSIS — I350 Nonrheumatic aortic (valve) stenosis: Secondary | ICD-10-CM

## 2011-06-17 DIAGNOSIS — I359 Nonrheumatic aortic valve disorder, unspecified: Secondary | ICD-10-CM

## 2011-06-17 NOTE — Progress Notes (Signed)
Colon Branch Date of Birth  [birth year] [facility] [address]    [address] [place], [place]  [address] [phone]  Fax  [phone]  History of Present Illness:  77 year old female with a history of aortic stenosis and complete heart block. She had a pacer placed in March of 2012.  She's been able to do all of her normal activities without any significant problems. She and her husband antique shopping on weekends and she has no difficulty in getting around in these stores.  Current Outpatient Prescriptions on File Prior to Visit  Medication Sig Dispense Refill  . ALPRAZolam (XANAX) 0.25 MG tablet Take 0.25 mg by mouth every 8 (eight) hours as needed.        Marland Kitchen aspirin 325 MG tablet Take 325 mg by mouth daily.        Marland Kitchen estrogens, conjugated, (PREMARIN) 0.3 MG tablet Take 0.3 mg by mouth daily. Take daily for 21 days then do not take for 7 days.       . Eszopiclone (LUNESTA PO) Take 1 tablet by mouth at bedtime as needed.        . hydrochlorothiazide 25 MG tablet Take 25 mg by mouth daily.       . hydrocodone-acetaminophen (LORCET-HD) 5-500 MG per capsule Take 1 capsule by mouth as needed.        Marland Kitchen IRON-VITAMIN C PO Take 1 tablet by mouth daily.        . medroxyPROGESTERone (PROVERA) 2.5 MG tablet Take 2.5 mg by mouth daily.        . metoprolol (TOPROL-XL) 50 MG 24 hr tablet Take 1 tablet (50 mg total) by mouth daily.  30 tablet  1  . quinapril (ACCUPRIL) 40 MG tablet Take 1 tablet (40 mg total) by mouth 2 (two) times daily at 10 AM and 5 PM.  60 tablet  11  . simvastatin (ZOCOR) 40 MG tablet Take 40 mg by mouth at bedtime.         Allergies  Allergen Reactions  . Macrodantin   . Penicillins   . Tolectin (Tolmetin Sodium)     Past Medical History  Diagnosis Date  . HTN (hypertension)   . Dyslipidemia   . Severe aortic stenosis   . Complete heart block     Pacer  . RBBB (right bundle branch block)     Past Surgical History  Procedure Date  . Insert /  replace / remove pacemaker 08/30/10    DUAL CHAMBER/ST. JUDE  . Pilonidal cyst excision   . Hernia repair     History  Smoking status  . Never Smoker   Smokeless tobacco  . Never Used    History  Alcohol Use No    No family history on file.  Reviw of Systems:  Reviewed in the HPI.  All other systems are negative.  Physical Exam: BP 178/75  Pulse 74  Ht 4\' 10"  (1.473 m)  Wt 144 lb 1.9 oz (65.372 kg)  BMI 30.12 kg/m2 The patient is alert and oriented x 3.  The mood and affect are normal.   Skin: warm and dry.  Color is normal.    HEENT:   [place]/AT, she has bilateral radiation of her systolic murmur up into her carotids.  Lungs: Clear to auscultation.   Heart: Regular rate S1-S2. She has a very high-pitched 3-4/6 systolic murmur.    Abdomen: Her abdominal exam is benign.  She has no hepatomegaly.  Extremities:  There is  no clubbing cyanosis or edema. Pulses are 1-2+.  Neuro:  Exam is nonfocal.    ECG: Normal sinus rhythm with ventricular pacing.  Assessment / Plan:

## 2011-06-17 NOTE — Patient Instructions (Signed)
Your physician wants you to follow-up in: 6 months  You will receive a reminder letter in the mail two months in advance. If you don't receive a letter, please call our office to schedule the follow-up appointment.  Your physician recommends that you continue on your current medications as directed. Please refer to the Current Medication list given to you today.  

## 2011-06-17 NOTE — Assessment & Plan Note (Signed)
Erin Charles is doing fairly well. Her last echocardiogram in 2010 revealed moderate to severe aortic stenosis. She remains asymptomatic. At age 77, I would be inclined to delay any surgery as long as possible.  At present she remains fairly asymptomatic. Her carotid pulses are little bit delayed and we will continue to keep and eye on  that.

## 2011-06-27 ENCOUNTER — Ambulatory Visit: Payer: Federal, State, Local not specified - PPO | Admitting: Cardiovascular Disease

## 2011-08-03 ENCOUNTER — Encounter (HOSPITAL_COMMUNITY): Payer: Self-pay | Admitting: Physical Medicine and Rehabilitation

## 2011-08-03 ENCOUNTER — Observation Stay (HOSPITAL_COMMUNITY)
Admission: EM | Admit: 2011-08-03 | Discharge: 2011-08-04 | Disposition: A | Discharge status: Home / Self Care | Payer: Federal, State, Local not specified - PPO | Attending: Cardiovascular Disease | Admitting: Cardiovascular Disease

## 2011-08-03 ENCOUNTER — Emergency Department (HOSPITAL_COMMUNITY): Payer: Federal, State, Local not specified - PPO

## 2011-08-03 ENCOUNTER — Other Ambulatory Visit: Payer: Self-pay

## 2011-08-03 DIAGNOSIS — I4510 Unspecified right bundle-branch block: Secondary | ICD-10-CM | POA: Insufficient documentation

## 2011-08-03 DIAGNOSIS — R0989 Other specified symptoms and signs involving the circulatory and respiratory systems: Secondary | ICD-10-CM | POA: Insufficient documentation

## 2011-08-03 DIAGNOSIS — I359 Nonrheumatic aortic valve disorder, unspecified: Secondary | ICD-10-CM | POA: Insufficient documentation

## 2011-08-03 DIAGNOSIS — E876 Hypokalemia: Secondary | ICD-10-CM

## 2011-08-03 DIAGNOSIS — I10 Essential (primary) hypertension: Secondary | ICD-10-CM | POA: Insufficient documentation

## 2011-08-03 DIAGNOSIS — E785 Hyperlipidemia, unspecified: Secondary | ICD-10-CM | POA: Insufficient documentation

## 2011-08-03 DIAGNOSIS — I350 Nonrheumatic aortic (valve) stenosis: Secondary | ICD-10-CM

## 2011-08-03 DIAGNOSIS — I200 Unstable angina: Secondary | ICD-10-CM

## 2011-08-03 DIAGNOSIS — R079 Chest pain, unspecified: Principal | ICD-10-CM | POA: Insufficient documentation

## 2011-08-03 DIAGNOSIS — Z950 Presence of cardiac pacemaker: Secondary | ICD-10-CM | POA: Insufficient documentation

## 2011-08-03 DIAGNOSIS — R0609 Other forms of dyspnea: Secondary | ICD-10-CM | POA: Insufficient documentation

## 2011-08-03 LAB — DIFFERENTIAL
Eosinophils Absolute: 0.1 10*3/uL (ref 0.0–0.7)
Lymphocytes Relative: 20 % (ref 12–46)
Lymphs Abs: 1.2 10*3/uL (ref 0.7–4.0)
Monocytes Relative: 13 % — ABNORMAL HIGH (ref 3–12)
Neutrophils Relative %: 65 % (ref 43–77)

## 2011-08-03 LAB — BASIC METABOLIC PANEL
BUN: 16 mg/dL (ref 6–23)
CO2: 27 mEq/L (ref 19–32)
Chloride: 96 mEq/L (ref 96–112)
GFR calc non Af Amer: 52 mL/min — ABNORMAL LOW (ref >90)
Glucose, Bld: 120 mg/dL — ABNORMAL HIGH (ref 70–99)
Potassium: 2.9 mEq/L — ABNORMAL LOW (ref 3.5–5.1)
Sodium: 135 mEq/L (ref 135–145)

## 2011-08-03 LAB — CARDIAC PANEL(CRET KIN+CKTOT+MB+TROPI)
CK, MB: 4.8 ng/mL — ABNORMAL HIGH (ref 0.3–4.0)
Relative Index: 3.5 — ABNORMAL HIGH (ref 0.0–2.5)
Troponin I: <0.3 ng/mL (ref <0.30)

## 2011-08-03 LAB — CBC
Hemoglobin: 13 g/dL (ref 12.0–15.0)
MCH: 29.3 pg (ref 26.0–34.0)
MCV: 84.7 fL (ref 78.0–100.0)
RBC: 4.43 MIL/uL (ref 3.87–5.11)
WBC: 5.8 10*3/uL (ref 4.0–10.5)

## 2011-08-03 MED ORDER — ONDANSETRON HCL 4 MG/2ML IJ SOLN
INTRAMUSCULAR | Status: AC
  Administered 2011-08-03: 4 mg
  Filled 2011-08-03: qty 2

## 2011-08-03 MED ORDER — POTASSIUM CHLORIDE CRYS ER 20 MEQ PO TBCR
40.0000 meq | EXTENDED_RELEASE_TABLET | Freq: Once | ORAL | Status: AC
  Administered 2011-08-03: 40 meq via ORAL
  Filled 2011-08-03: qty 2

## 2011-08-03 NOTE — ED Notes (Signed)
Pt presents to department for evaluation of midsternal chest pain radiating to L breast. Sudden onset this afternoon while patient was taking a nap. Pt denies SOB. Respirations unlabored. Lung sounds clear and equal bilaterally. 1/10 pain at the time. Nothing makes pain worse. Skin warm and dry. Pt conscious alert and oriented x4.

## 2011-08-03 NOTE — ED Provider Notes (Signed)
History     CSN: [id number]  Arrival date & time 08/03/11  1816   First MD Initiated Contact with Patient 08/03/11 1817      Chief Complaint  Patient presents with  . Chest Pain    (Consider location/radiation/quality/duration/timing/severity/associated sxs/prior treatment) Patient is a 78 y.o. female presenting with chest pain. The history is provided by the patient.  Chest Pain The chest pain began 3 - 5 hours ago. Duration of episode(s) is 2 hours. Chest pain occurs constantly. The chest pain is resolved. Associated with: Started when she was sleeping and awoke her from sleep. At its most intense, the pain is at 7/10. The pain is currently at 0/10. The severity of the pain is moderate. The quality of the pain is described as aching, dull and pressure-like. Radiates to: Start in the center of her chest and radiated under her left breast. Exacerbated by: Nothing. Pertinent negatives for primary symptoms include no fever, no cough, no wheezing, no palpitations, no abdominal pain, no nausea and no vomiting. She tried aspirin for the symptoms. Risk factors include being elderly.  Her past medical history is significant for hyperlipidemia and hypertension.  Procedure history is positive for echocardiogram.  Procedure history is negative for cardiac catheterization. Procedure history comments: History of aortic stenosis.     Past Medical History  Diagnosis Date  . HTN (hypertension)   . Dyslipidemia   . Severe aortic stenosis   . Complete heart block     Pacer  . RBBB (right bundle branch block)     Past Surgical History  Procedure Date  . Insert / replace / remove pacemaker 08/30/10    DUAL CHAMBER/ST. JUDE  . Pilonidal cyst excision   . Hernia repair     No family history on file.  History  Substance Use Topics  . Smoking status: Never Smoker   . Smokeless tobacco: Never Used  . Alcohol Use: No    OB History    Grav Para Term Preterm Abortions TAB SAB Ect Mult Living                  Review of Systems  Constitutional: Negative for fever.  Respiratory: Negative for cough and wheezing.   Cardiovascular: Positive for chest pain. Negative for palpitations.  Gastrointestinal: Negative for nausea, vomiting and abdominal pain.  All other systems reviewed and are negative.    Allergies  Macrodantin; Penicillins; and Tolectin  Home Medications   Current Outpatient Rx  Name Route Sig Dispense Refill  . ALPRAZOLAM 0.25 MG PO TABS Oral Take 0.25 mg by mouth every 8 (eight) hours as needed. For anxiety    . ASPIRIN 325 MG PO TABS Oral Take 325 mg by mouth daily.      . ASPIRIN 81 MG PO CHEW Oral Chew 81 mg by mouth 4 (four) times daily.    Marland Kitchen ESTROGENS CONJUGATED 0.3 MG PO TABS Oral Take 0.3 mg by mouth daily. Take daily for 21 days then do not take for 7 days.     Alfonso Patten PO Oral Take 1 tablet by mouth at bedtime as needed.     Marland Kitchen HYDROCHLOROTHIAZIDE 25 MG PO TABS Oral Take 25 mg by mouth daily.     Marland Kitchen HYDROCODONE-ACETAMINOPHEN 5-500 MG PO CAPS Oral Take 3 capsules by mouth as needed. For back pain    . IRON-VITAMIN C PO Oral Take 1 tablet by mouth daily.     Marland Kitchen MEDROXYPROGESTERONE ACETATE 2.5 MG PO TABS Oral Take  2.5 mg by mouth daily.      Marland Kitchen METOPROLOL SUCCINATE ER 50 MG PO TB24 Oral Take 1 tablet (50 mg total) by mouth daily. 30 tablet 1  . QUINAPRIL HCL 40 MG PO TABS Oral Take 1 tablet (40 mg total) by mouth 2 (two) times daily at 10 AM and 5 PM. 60 tablet 11  . SIMVASTATIN 40 MG PO TABS Oral Take 40 mg by mouth at bedtime.       BP 139/88  Pulse 87  Temp(Src) 98 F (36.7 C) (Oral)  Resp 18  SpO2 98%  Physical Exam  Nursing note and vitals reviewed. Constitutional: She is oriented to person, place, and time. She appears well-developed and well-nourished. No distress.  HENT:  Head: Normocephalic and atraumatic.  Mouth/Throat: Oropharynx is clear and moist.  Eyes: EOM are normal. Pupils are equal, round, and reactive to light.  Cardiovascular:  Normal rate, regular rhythm and intact distal pulses.  Exam reveals no friction rub.   Murmur heard.  Crescendo systolic murmur is present with a grade of 3/6       Heard best at the left sternal border  Pulmonary/Chest: Effort normal and breath sounds normal. She has no wheezes. She has no rales. She exhibits no tenderness.  Abdominal: Soft. Bowel sounds are normal. She exhibits no distension. There is no tenderness. There is no rebound and no guarding.  Musculoskeletal: Normal range of motion. She exhibits no tenderness.       No edema  Neurological: She is alert and oriented to person, place, and time. No cranial nerve deficit.  Skin: Skin is warm and dry. No rash noted.  Psychiatric: She has a normal mood and affect. Her behavior is normal.    ED Course  Procedures (including critical care time)  Labs Reviewed  DIFFERENTIAL - Abnormal; Notable for the following:    Monocytes Relative 13 (*)    All other components within normal limits  BASIC METABOLIC PANEL - Abnormal; Notable for the following:    Potassium 2.9 (*)    Glucose, Bld 120 (*)    GFR calc non Af Amer 52 (*)    GFR calc Af Amer 60 (*)    All other components within normal limits  CBC  CARDIAC PANEL(CRET KIN+CKTOT+MB+TROPI)   Dg Chest Port 1 View  08/03/2011  *RADIOLOGY REPORT*  Clinical Data: Chest pain.  Hypertension.  PORTABLE CHEST - 1 VIEW  Comparison: 08/31/2010  Findings: Cardiomegaly stable.  Both lungs are clear.  No evidence of pleural effusion.  Dual lead transvenous pacemaker remains in appropriate position.  IMPRESSION: Stable cardiomegaly.  No active lung disease.  Original Report Authenticated By: Danae Orleans, M.D.    Date: 08/03/2011  Rate: 71  Rhythm: paced  QRS Axis: paced   Intervals: paced  ST/T Wave abnormalities:   Conduction Disutrbances:  Narrative Interpretation:   Old EKG Reviewed: unchanged    1. Aortic stenosis   2. Chest pain       MDM   Patient who woke up today with  chest pain in the center her chest that radiated under her left breast which lasted for 2 hours and only resolved after getting nitroglycerin and aspirin by EMS. She has a history of aortic stenosis but has never had chest pain like this before. She is status post pacemaker after having complete heart block but has no new coronary blockage. On arrival here patient is pain free. Normal vital signs. Normal exam. Concern for cardiac cause  of her chest pain. She denies any symptoms suggestive of a GI source and no new medication changes. She denies any respiratory issues concerning for lung pathology. CBC with normal hemoglobin. BMP with hypokalemia of 2.9 which is new and she was replaced with 40 mEq. Chest x-ray within normal limits. Cardiac enzymes currently pending. Because patient is pain free now she was not given any nitroglycerin here and received aspirin prior to arrival.   11:40 PM Cardiac enzymes within normal limits. Spoke with cardiology and will admit patient for rule out.    Gwyneth Sprout, MD 08/03/11 2340

## 2011-08-03 NOTE — ED Notes (Signed)
Pt presents to department via [facility] for evaluation of midsternal chest pain radiating to L breast. Woke her up from nap this afternoon. 3/10 pain upon arrival. No SOB, denies diaphoresis. Received 324 ASA, 1 sublingual nitroglycerin and 4mg  zofran per EMS. She is alert and oriented x4. Pt states she felt weak today, is usually very active at home. 22g RAC.

## 2011-08-04 ENCOUNTER — Encounter (HOSPITAL_COMMUNITY): Payer: Self-pay | Admitting: Internal Medicine

## 2011-08-04 ENCOUNTER — Other Ambulatory Visit: Payer: Self-pay

## 2011-08-04 DIAGNOSIS — R079 Chest pain, unspecified: Secondary | ICD-10-CM

## 2011-08-04 DIAGNOSIS — I200 Unstable angina: Secondary | ICD-10-CM

## 2011-08-04 LAB — CBC
Hemoglobin: 11.9 g/dL — ABNORMAL LOW (ref 12.0–15.0)
MCV: 85.3 fL (ref 78.0–100.0)
Platelets: 173 10*3/uL (ref 150–400)
RBC: 4.09 MIL/uL (ref 3.87–5.11)
WBC: 6 10*3/uL (ref 4.0–10.5)

## 2011-08-04 LAB — PROTIME-INR
INR: 1.15 (ref 0.00–1.49)
Prothrombin Time: 14.9 seconds (ref 11.6–15.2)

## 2011-08-04 LAB — CARDIAC PANEL(CRET KIN+CKTOT+MB+TROPI)
CK, MB: 5.3 ng/mL — ABNORMAL HIGH (ref 0.3–4.0)
Relative Index: 4 — ABNORMAL HIGH (ref 0.0–2.5)
Troponin I: <0.3 ng/mL (ref <0.30)
Troponin I: <0.3 ng/mL (ref <0.30)

## 2011-08-04 LAB — BASIC METABOLIC PANEL
CO2: 27 mEq/L (ref 19–32)
Chloride: 97 mEq/L (ref 96–112)
Creatinine, Ser: 0.86 mg/dL (ref 0.50–1.10)
Glucose, Bld: 93 mg/dL (ref 70–99)

## 2011-08-04 LAB — LIPID PANEL
LDL Cholesterol: 28 mg/dL (ref 0–99)
Triglycerides: 157 mg/dL — ABNORMAL HIGH (ref <150)

## 2011-08-04 MED ORDER — SIMVASTATIN 40 MG PO TABS
40.0000 mg | ORAL_TABLET | Freq: Every day | ORAL | Status: DC
  Filled 2011-08-04: qty 1

## 2011-08-04 MED ORDER — ACETAMINOPHEN 325 MG PO TABS: 650.0000 mg | ORAL_TABLET | ORAL | Status: DC | PRN

## 2011-08-04 MED ORDER — ASPIRIN 325 MG PO TABS
325.0000 mg | ORAL_TABLET | Freq: Every day | ORAL | Status: DC
  Administered 2011-08-04: 325 mg via ORAL
  Filled 2011-08-04: qty 1

## 2011-08-04 MED ORDER — ZOLPIDEM TARTRATE 5 MG PO TABS: 5.0000 mg | ORAL_TABLET | Freq: Every evening | ORAL | Status: DC | PRN

## 2011-08-04 MED ORDER — METOPROLOL SUCCINATE ER 50 MG PO TB24
50.0000 mg | ORAL_TABLET | Freq: Every day | ORAL | Status: DC
  Administered 2011-08-04: 50 mg via ORAL
  Filled 2011-08-04: qty 1

## 2011-08-04 MED ORDER — QUINAPRIL HCL 10 MG PO TABS: 40.0000 mg | ORAL_TABLET | Freq: Two times a day (BID) | ORAL | Status: DC

## 2011-08-04 MED ORDER — LISINOPRIL 40 MG PO TABS
40.0000 mg | ORAL_TABLET | Freq: Two times a day (BID) | ORAL | Status: DC
  Administered 2011-08-04: 40 mg via ORAL
  Filled 2011-08-04 (×2): qty 1

## 2011-08-04 MED ORDER — MEDROXYPROGESTERONE ACETATE 2.5 MG PO TABS
2.5000 mg | ORAL_TABLET | Freq: Every day | ORAL | Status: DC
  Administered 2011-08-04: 2.5 mg via ORAL
  Filled 2011-08-04: qty 1

## 2011-08-04 MED ORDER — HYDROCODONE-ACETAMINOPHEN 5-325 MG PO TABS
1.0000 | ORAL_TABLET | Freq: Four times a day (QID) | ORAL | Status: DC | PRN
  Administered 2011-08-04 (×2): 1 via ORAL
  Filled 2011-08-04 (×2): qty 1

## 2011-08-04 MED ORDER — ONDANSETRON HCL 4 MG/2ML IJ SOLN: 4.0000 mg | Freq: Four times a day (QID) | INTRAMUSCULAR | Status: DC | PRN

## 2011-08-04 MED ORDER — FERROUS SULFATE 325 (65 FE) MG PO TABS
325.0000 mg | ORAL_TABLET | Freq: Every day | ORAL | Status: DC
  Administered 2011-08-04: 325 mg via ORAL
  Filled 2011-08-04 (×2): qty 1

## 2011-08-04 MED ORDER — POTASSIUM CHLORIDE CRYS ER 20 MEQ PO TBCR: 40.0000 meq | EXTENDED_RELEASE_TABLET | Freq: Two times a day (BID) | ORAL | Status: DC | PRN

## 2011-08-04 MED ORDER — IRON-VITAMIN C 100-250 MG PO TABS: 1.0000 | ORAL_TABLET | Freq: Every day | ORAL | Status: DC

## 2011-08-04 MED ORDER — POTASSIUM CHLORIDE CRYS ER 20 MEQ PO TBCR
EXTENDED_RELEASE_TABLET | ORAL | Status: AC
  Filled 2011-08-04: qty 4

## 2011-08-04 MED ORDER — HEPARIN SODIUM (PORCINE) 5000 UNIT/ML IJ SOLN
5000.0000 [IU] | Freq: Three times a day (TID) | INTRAMUSCULAR | Status: DC
  Administered 2011-08-04: 5000 [IU] via SUBCUTANEOUS
  Filled 2011-08-04 (×4): qty 1

## 2011-08-04 MED ORDER — VITAMIN C 250 MG PO TABS
250.0000 mg | ORAL_TABLET | Freq: Every day | ORAL | Status: DC
  Administered 2011-08-04: 250 mg via ORAL
  Filled 2011-08-04 (×2): qty 1

## 2011-08-04 MED ORDER — POTASSIUM CHLORIDE ER 10 MEQ PO TBCR: 10.0000 meq | EXTENDED_RELEASE_TABLET | Freq: Every day | ORAL | Status: DC

## 2011-08-04 MED ORDER — ESTROGENS CONJUGATED 0.3 MG PO TABS
0.3000 mg | ORAL_TABLET | Freq: Every day | ORAL | Status: DC
  Administered 2011-08-04: 0.3 mg via ORAL
  Filled 2011-08-04: qty 1

## 2011-08-04 MED ORDER — NITROGLYCERIN 0.4 MG SL SUBL: 0.4000 mg | SUBLINGUAL_TABLET | SUBLINGUAL | Status: DC | PRN

## 2011-08-04 MED ORDER — HYDROCHLOROTHIAZIDE 25 MG PO TABS
25.0000 mg | ORAL_TABLET | Freq: Every day | ORAL | Status: DC
  Administered 2011-08-04: 25 mg via ORAL
  Filled 2011-08-04: qty 1

## 2011-08-04 MED ORDER — ALPRAZOLAM 0.25 MG PO TABS
0.2500 mg | ORAL_TABLET | Freq: Three times a day (TID) | ORAL | Status: DC | PRN
  Administered 2011-08-04: 0.25 mg via ORAL
  Filled 2011-08-04: qty 1

## 2011-08-04 MED ORDER — POTASSIUM CHLORIDE CRYS ER 20 MEQ PO TBCR
80.0000 meq | EXTENDED_RELEASE_TABLET | Freq: Two times a day (BID) | ORAL | Status: DC | PRN
  Administered 2011-08-04: 80 meq via ORAL

## 2011-08-04 NOTE — H&P (Signed)
Erin Charles is an 78 y.o. female.   Chief Complaint:  Chest pain HPI: Erin Charles is a very pleasant 78 yo woman with PMH of HTN, HLD, severe AS (by 2010 TTE), complete heart block s/p permanent pacemaker who has been doing well at home until having approximately 10-15 minutes of chest tightness that radiated to her left breast while standing up this afternoon. The pain was relieved with rest; there was no associated diaphoresis, neck/jaw pain, nausea or vomiting. The pain has not recurred and the intensity was never greater than 4-5/10; however, she had never had this pain before so she came in. She has stable DOE - unknown total distal but she likes to [employer] so she can rest between boothes. No PND, no chest pain, no pre-syncope, syncope, dizziness or other weakness/fatigue. She has been caring for multiple family members with illnesses - norovirus, grandson with spina bifida and diarrhea and her husband recently had c. Diff. However, she has no diarrhea or constipation.   Past Medical History  Diagnosis Date  . HTN (hypertension)   . Dyslipidemia   . Severe aortic stenosis   . Complete heart block     Pacer  . RBBB (right bundle branch block)     Past Surgical History  Procedure Date  . Insert / replace / remove pacemaker 08/30/10    DUAL CHAMBER/ST. JUDE  . Pilonidal cyst excision   . Hernia repair     No family history on file. Social History:  reports that she has never smoked. She has never used smokeless tobacco. She reports that she does not drink alcohol or use illicit drugs. Family history: no CAD, HTN or T2DM in mother or father Allergies:  Allergies  Allergen Reactions  . Macrodantin     rash  . Penicillins     rash  . Tolectin (Tolmetin Sodium)     rash    Medications Prior to Admission  Medication Dose Route Frequency Provider Last Rate Last Dose  . ondansetron (ZOFRAN) 4 MG/2ML injection        4 mg at 08/03/11 1829  . potassium chloride SA  (K-DUR,KLOR-CON) CR tablet 40 mEq  40 mEq Oral Once Gwyneth Sprout, MD   40 mEq at 08/03/11 2004   Medications Prior to Admission  Medication Sig Dispense Refill  . ALPRAZolam (XANAX) 0.25 MG tablet Take 0.25 mg by mouth every 8 (eight) hours as needed. For anxiety      . aspirin 325 MG tablet Take 325 mg by mouth daily.        Marland Kitchen estrogens, conjugated, (PREMARIN) 0.3 MG tablet Take 0.3 mg by mouth daily. Take daily for 21 days then do not take for 7 days.       . Eszopiclone (LUNESTA PO) Take 1 tablet by mouth at bedtime as needed.       . hydrochlorothiazide 25 MG tablet Take 25 mg by mouth daily.       . hydrocodone-acetaminophen (LORCET-HD) 5-500 MG per capsule Take 3 capsules by mouth as needed. For back pain      . IRON-VITAMIN C PO Take 1 tablet by mouth daily.       . medroxyPROGESTERone (PROVERA) 2.5 MG tablet Take 2.5 mg by mouth daily.        . metoprolol (TOPROL-XL) 50 MG 24 hr tablet Take 1 tablet (50 mg total) by mouth daily.  30 tablet  1  . quinapril (ACCUPRIL) 40 MG tablet Take 1 tablet (40 mg total)  by mouth 2 (two) times daily at 10 AM and 5 PM.  60 tablet  11  . simvastatin (ZOCOR) 40 MG tablet Take 40 mg by mouth at bedtime.         Results for orders placed during the hospital encounter of 08/03/11 (from the past 48 hour(s))  CBC     Status: Normal   Collection Time   08/03/11  6:33 PM      Component Value Range Comment   WBC 5.8  4.0 - 10.5 (K/uL)    RBC 4.43  3.87 - 5.11 (MIL/uL)    Hemoglobin 13.0  12.0 - 15.0 (g/dL)    HCT 30.8  65.7 - 84.6 (%)    MCV 84.7  78.0 - 100.0 (fL)    MCH 29.3  26.0 - 34.0 (pg)    MCHC 34.7  30.0 - 36.0 (g/dL)    RDW 96.2  95.2 - 84.1 (%)    Platelets 192  150 - 400 (K/uL)   DIFFERENTIAL     Status: Abnormal   Collection Time   08/03/11  6:33 PM      Component Value Range Comment   Neutrophils Relative 65  43 - 77 (%)    Neutro Abs 3.8  1.7 - 7.7 (K/uL)    Lymphocytes Relative 20  12 - 46 (%)    Lymphs Abs 1.2  0.7 - 4.0 (K/uL)      Monocytes Relative 13 (*) 3 - 12 (%)    Monocytes Absolute 0.7  0.1 - 1.0 (K/uL)    Eosinophils Relative 1  0 - 5 (%)    Eosinophils Absolute 0.1  0.0 - 0.7 (K/uL)    Basophils Relative 1  0 - 1 (%)    Basophils Absolute 0.1  0.0 - 0.1 (K/uL)   BASIC METABOLIC PANEL     Status: Abnormal   Collection Time   08/03/11  6:33 PM      Component Value Range Comment   Sodium 135  135 - 145 (mEq/L)    Potassium 2.9 (*) 3.5 - 5.1 (mEq/L)    Chloride 96  96 - 112 (mEq/L)    CO2 27  19 - 32 (mEq/L)    Glucose, Bld 120 (*) 70 - 99 (mg/dL)    BUN 16  6 - 23 (mg/dL)    Creatinine, Ser 3.24  0.50 - 1.10 (mg/dL)    Calcium 9.1  8.4 - 10.5 (mg/dL)    GFR calc non Af Amer 52 (*) >90 (mL/min)    GFR calc Af Amer 60 (*) >90 (mL/min)   CARDIAC PANEL(CRET KIN+CKTOT+MB+TROPI)     Status: Abnormal   Collection Time   08/03/11 10:30 PM      Component Value Range Comment   Total CK 138  7 - 177 (U/L)    CK, MB 4.8 (*) 0.3 - 4.0 (ng/mL)    Troponin I <0.30  <0.30 (ng/mL)    Relative Index 3.5 (*) 0.0 - 2.5     Dg Chest Port 1 View  08/03/2011  *RADIOLOGY REPORT*  Clinical Data: Chest pain.  Hypertension.  PORTABLE CHEST - 1 VIEW  Comparison: 08/31/2010  Findings: Cardiomegaly stable.  Both lungs are clear.  No evidence of pleural effusion.  Dual lead transvenous pacemaker remains in appropriate position.  IMPRESSION: Stable cardiomegaly.  No active lung disease.  Original Report Authenticated By: Danae Orleans, M.D.    Review of Systems  Constitutional: Negative for fever, chills, weight loss and malaise/fatigue.  HENT: Negative for hearing loss and neck pain.   Eyes: Negative for blurred vision, double vision and photophobia.  Respiratory: Positive for shortness of breath. Negative for cough, hemoptysis and sputum production.   Cardiovascular: Positive for chest pain. Negative for palpitations, orthopnea, claudication, leg swelling and PND.  Gastrointestinal: Negative for heartburn, nausea and vomiting.   Genitourinary: Negative for dysuria, urgency and frequency.  Musculoskeletal: Negative for myalgias and back pain.  Skin: Negative for itching and rash.  Neurological: Negative for dizziness, tingling, tremors, sensory change and headaches.  Endo/Heme/Allergies: Negative for environmental allergies and polydipsia.  Psychiatric/Behavioral: Negative for depression, suicidal ideas and substance abuse.    Blood pressure 139/88, pulse 87, temperature 98 F (36.7 C), temperature source Oral, resp. rate 18, SpO2 98.00%. Physical Exam  Nursing note and vitals reviewed. Constitutional: She is oriented to person, place, and time. She appears well-developed and well-nourished. No distress.  HENT:  Head: Normocephalic and atraumatic.  Nose: Nose normal.  Mouth/Throat: Oropharynx is clear and moist. No oropharyngeal exudate.  Eyes: Conjunctivae and EOM are normal. Pupils are equal, round, and reactive to light. No scleral icterus.  Neck: Normal range of motion. Neck supple. No JVD present. No tracheal deviation present. No thyromegaly present.  Cardiovascular: Normal rate, regular rhythm and intact distal pulses.  Exam reveals no gallop.   Murmur heard.  Crescendo decrescendo systolic murmur is present with a grade of 3/6       Holosystolic murmur   Respiratory: Effort normal. No respiratory distress. She has no wheezes. She has rales.  GI: Soft. Bowel sounds are normal. She exhibits no distension. There is no tenderness. There is no rebound.  Musculoskeletal: Normal range of motion. She exhibits no edema and no tenderness.  Neurological: She is alert and oriented to person, place, and time. She has normal reflexes. No cranial nerve deficit. Coordination normal.  Skin: Skin is warm and dry. No rash noted. She is not diaphoretic. No erythema.  Psychiatric: She has a normal mood and affect. Her behavior is normal.    Labs reviewed; na 135, K 2.9, creatinine 0.96, troponin negative, cbc h/h 13/38,  plt 192 EKG: dual A-V pacemaking with some inhibition (A & V) Chest x-ray: no overt PNA/infiltrate, PM, tented right diaphragm  Assessment/Plan Problem List Chest Pain Severe Aortic Stenosis Hypokalemia Hypertension S/p Pacemaker for CHB Tented right diaphragm  78 yo woman with Severe AS, HTN, hypokalemia, complete heart block s/p pacemaker who had 10-15 minutes of chest pain.   Chest pain: Never smoker, no family history but has age as risk factor. Other etiologies are certainly her AS (? Critical) and possible but less likely aggravated by her hypokalemia. EKG difficult to interpret with A-V pacing - received large ASA 324 mg (4 baby aspirins) - trend troponins, CKMB negative - given resolution of chest pain, no heparin and placed in observation - consider stress testing in addition to echocardiography based on further lab tests  Severe AS: I do not have prior records and she is age 54 with vague symptoms - perhaps SOB/DOE and no overt signs of CHF on exam. Last TTE 2010.  - repeat Echo to assess valve gradient (mean and peak velocity) (order placed) - based on results assess patients goals of care  Hypertension: appears fairly well controlled - continue Toprol XL 50 mg, quinapril 40 mg bid, hctz 25 mg  Hypokalemia: perhaps aggravated by hctz and/or not eating much - s/p supplementation, repeat labs, replete  Tented right diaphragm: keep PCP updated,  age appropriate cancer screening   Gautam Langhorst 08/04/2011, 12:28 AM

## 2011-08-04 NOTE — Progress Notes (Signed)
08/04/2011 11:20 AM Nursing Note: Patient ambulated in hallway 150 ft on RA and with NT. Patient tolerated well. No complaints of SOB or CP. Will continue to monitor.  Emit Kuenzel, Blanchard Kelch

## 2011-08-04 NOTE — Progress Notes (Signed)
Subjective: No recurrent chest pain or shortness of breath at rest. Plans to walk with nurse in hall this morning.   Objective: Temp:  [97.6 F (36.4 C)-98.7 F (37.1 C)] 98 F (36.7 C) (02/03 0446) Pulse Rate:  [69-87] 78  (02/03 0936) Resp:  [16-18] 18  (02/03 0446) BP: (112-162)/(52-88) 112/52 mmHg (02/03 0936) SpO2:  [98 %-100 %] 98 % (02/03 0446) Weight:  [140 lb 11.2 oz (63.821 kg)] 140 lb 11.2 oz (63.821 kg) (02/03 0221)  Telemetry - Paced rhythm, occasional PVC.  Exam -   General - NAD.  Lungs - Clear, nonlabored.  Cardiac - Regular rate and rhythm, 3/6 systolic murmur at the base, radiates towards apex. Question referral to carotids although may be left carotid bruit as well.  Abdomen - NABS.  Extremities - No pitting.  Testing -   Lab Results  Component Value Date   WBC 6.0 08/04/2011   HGB 11.9* 08/04/2011   HCT 34.9* 08/04/2011   MCV 85.3 08/04/2011   PLT 173 08/04/2011    Lab Results  Component Value Date   CREATININE 0.86 08/04/2011   BUN 13 08/04/2011   NA 135 08/04/2011   K 3.0* 08/04/2011   CL 97 08/04/2011   CO2 27 08/04/2011    Lab Results  Component Value Date   CKTOTAL 109 08/04/2011   CKMB 4.6* 08/04/2011   TROPONINI <0.30 08/04/2011    ECG - Sinus rhythm with occasional PVC and ventricular pacing.  Chest x-ray -  Findings: Cardiomegaly stable. Both lungs are clear. No evidence of pleural effusion. Dual lead transvenous pacemaker remains in appropriate position.  IMPRESSION: Stable cardiomegaly. No active lung disease.   Current Medications    . aspirin  325 mg Oral Daily  . estrogens (conjugated)  0.3 mg Oral Daily  . ferrous sulfate  325 mg Oral Q breakfast  . heparin  5,000 Units Subcutaneous Q8H  . hydrochlorothiazide  25 mg Oral Daily  . lisinopril  40 mg Oral BID  . medroxyPROGESTERone  2.5 mg Oral Daily  . metoprolol succinate  50 mg Oral Daily  . ondansetron      . potassium chloride  40 mEq Oral Once  . simvastatin  40 mg Oral  QHS  . vitamin C  250 mg Oral Q breakfast  . DISCONTD: Iron-Vitamin C  1 tablet Oral Daily  . DISCONTD: quinapril  40 mg Oral BID     Assessment:  1. Episode of chest pain, resolved without recurrence. Cardiac markers are normal so far. This occurred at rest. At baseline, she reports no typical exertional chest pain, although has NYHA class 2-3 dyspnea on exertion which has been more chronic, presumably related to her aortic stenosis.  2. History of moderate to severe aortic stenosis based on previous echocardiogram from 2010. States that she was due to have a followup study this March.  3. History complete heart block status post pacemaker placement.  4. Hypertension.  Plan:  Spoke with patient and her husband this morning, reviewed her records. Plan is for her to ambulate with nursing in the hall, and if no recurrent chest pain, and last set of cardiac markers is normal, anticipate discharge home today. She will need followup testing arranged in the office for later this week, including a 2-D echocardiogram to reassess degree of aortic stenosis and LV function, as well as a Lexiscan Myoview to assess for ischemia. She can then follow up with Dr. Elease Hashimoto for review.  Jonelle Sidle,  M.D., F.A.C.C.

## 2011-08-04 NOTE — Progress Notes (Signed)
08/04/2011 2:54 PM Nursing Note Discharge AVS, follow up appointments, medications already taken today and those due this evening given and explained to patient and family member. Questions addressed. PO Potassium given per orders prior to d/c home. Called in RX also explained to patient and husband. D/c iv . D/c tele. D/c home per orders. Questions and concerns addressed.  Bernece Gall, Blanchard Kelch

## 2011-08-04 NOTE — Discharge Summary (Signed)
CARDIOLOGY DISCHARGE SUMMARY   Patient ID: Erin Charles MRN: [id number] DOB/AGE: 06-Aug-1922 78 y.o.  Admit date: 08/03/2011 Discharge date: 08/04/2011  Primary Discharge Diagnosis:  Chest pain Secondary Discharge Diagnosis:  Patient Active Problem List  Diagnoses  . Hypertension  . Pacemaker  . Aortic stenosis  . Chest pain   Procedures: Portable chest x-ray  Hospital Course: Erin Charles is an 78 year old female with a history of complete heart block and a pacemaker but no coronary artery disease. She had chest tightness and came to the hospital where she was admitted for further evaluation and treatment.  Her EKG was evaluated and showed a right bundle branch block. Her troponins were negative for MI. She had some mild elevation in her CK-MB with the highest one being 125/6.8. This is consistent with previous enzymes reviewed from 2010. She was continued on her home blood pressure medications. She was hypokalemic on admission with a potassium of 2.9. This was supplemented and improved. It was felt that she needed an echocardiogram to evaluate her aortic stenosis and a stress test but these can be done as an outpatient. Her pacemaker was functioning well.  On 08/04/2011, Erin Charles was ambulating without chest pain or shortness of breath. She was evaluated by Dr. Diona Browner and considered stable for discharge, to followup as an outpatient.  Labs:   Lab Results  Component Value Date   WBC 6.0 08/04/2011   HGB 11.9* 08/04/2011   HCT 34.9* 08/04/2011   MCV 85.3 08/04/2011   PLT 173 08/04/2011    Lab 08/04/11 0500  NA 135  K 3.0*  CL 97  CO2 27  BUN 13  CREATININE 0.86  CALCIUM 8.7  PROT --  BILITOT --  ALKPHOS --  ALT --  AST --  GLUCOSE 93    Basename 08/04/11 1030 08/04/11 0610 08/03/11 2230  CKTOTAL 132 109 138  CKMB 5.3* 4.6* 4.8*  CKMBINDEX -- -- --  TROPONINI <0.30 <0.30 <0.30   Lipid Panel     Component Value Date/Time   CHOL 87 08/04/2011 0610   TRIG 157*  08/04/2011 0610   HDL 28* 08/04/2011 0610   CHOLHDL 3.1 08/04/2011 0610   VLDL 31 08/04/2011 0610   LDLCALC 28 08/04/2011 0610    Basename 08/04/11 0500  INR 1.15       Radiology: Dg Chest Port 1 View  08/03/2011  *RADIOLOGY REPORT*  Clinical Data: Chest pain.  Hypertension.  PORTABLE CHEST - 1 VIEW  Comparison: 08/31/2010  Findings: Cardiomegaly stable.  Both lungs are clear.  No evidence of pleural effusion.  Dual lead transvenous pacemaker remains in appropriate position.  IMPRESSION: Stable cardiomegaly.  No active lung disease.  Original Report Authenticated By: Danae Orleans, M.D.    EKG: 04-Aug-2011 02:27:04  Sinus rhythm with sinus arrhythmia with occasional Premature ventricular complexes, rate 74 Possible Left atrial enlargement Left axis deviation Left ventricular hypertrophy with QRS widening and repolarization abnormality Inferior infarct , age undetermined Anterolateral infarct , age undetermined  FOLLOW UP PLANS AND APPOINTMENTS  Medication List  As of 08/04/2011  1:21 PM   STOP taking these medications         aspirin 81 MG chewable tablet         TAKE these medications         ALPRAZolam 0.25 MG tablet   Commonly known as: XANAX   Take 0.25 mg by mouth every 8 (eight) hours as needed. For anxiety      aspirin 325  MG tablet   Take 325 mg by mouth daily.      estrogens (conjugated) 0.3 MG tablet   Commonly known as: PREMARIN   Take 0.3 mg by mouth daily. Take daily for 21 days then do not take for 7 days.      hydrochlorothiazide 25 MG tablet   Commonly known as: HYDRODIURIL   Take 25 mg by mouth daily.      hydrocodone-acetaminophen 5-500 MG per capsule   Commonly known as: LORCET-HD   Take 3 capsules by mouth as needed. For back pain      IRON-VITAMIN C PO   Take 1 tablet by mouth daily.      LUNESTA PO   Take 1 tablet by mouth at bedtime as needed.      medroxyPROGESTERone 2.5 MG tablet   Commonly known as: PROVERA   Take 2.5 mg by mouth daily.       metoprolol succinate 50 MG 24 hr tablet   Commonly known as: TOPROL-XL   Take 1 tablet (50 mg total) by mouth daily.      quinapril 40 MG tablet   Commonly known as: ACCUPRIL   Take 1 tablet (40 mg total) by mouth 2 (two) times daily at 10 AM and 5 PM.      simvastatin 40 MG tablet   Commonly known as: ZOCOR   Take 40 mg by mouth at bedtime.           Follow-up Information    Follow up with Mickie Hillier, MD in 2 weeks. (As needed)       Follow up with Elyn Aquas., MD. (The office will call.)    Contact information:   [address], [address] [place] [address] [phone]          BRING ALL MEDICATIONS WITH YOU TO FOLLOW UP APPOINTMENTS  Time spent with patient to include physician time: 35 min Signed: Theodore Demark 08/04/2011, 1:21 PM Co-Sign MD

## 2011-08-20 ENCOUNTER — Ambulatory Visit (INDEPENDENT_AMBULATORY_CARE_PROVIDER_SITE_OTHER): Payer: Federal, State, Local not specified - PPO | Admitting: Nurse Practitioner

## 2011-08-20 ENCOUNTER — Encounter: Payer: Self-pay | Admitting: Nurse Practitioner

## 2011-08-20 VITALS — BP 128/76 | HR 76 | Ht 59.0 in | Wt 144.0 lb

## 2011-08-20 DIAGNOSIS — I359 Nonrheumatic aortic valve disorder, unspecified: Secondary | ICD-10-CM

## 2011-08-20 DIAGNOSIS — R079 Chest pain, unspecified: Secondary | ICD-10-CM

## 2011-08-20 DIAGNOSIS — I10 Essential (primary) hypertension: Secondary | ICD-10-CM

## 2011-08-20 DIAGNOSIS — I350 Nonrheumatic aortic (valve) stenosis: Secondary | ICD-10-CM

## 2011-08-20 DIAGNOSIS — E876 Hypokalemia: Secondary | ICD-10-CM

## 2011-08-20 LAB — BASIC METABOLIC PANEL
BUN: 20 mg/dL (ref 6–23)
CO2: 28 mEq/L (ref 19–32)
Calcium: 9.5 mg/dL (ref 8.4–10.5)
Chloride: 104 mEq/L (ref 96–112)
Creatinine, Ser: 1 mg/dL (ref 0.4–1.2)
GFR: 54.49 mL/min — ABNORMAL LOW (ref >60.00)
Glucose, Bld: 120 mg/dL — ABNORMAL HIGH (ref 70–99)
Potassium: 4.4 mEq/L (ref 3.5–5.1)
Sodium: 139 mEq/L (ref 135–145)

## 2011-08-20 NOTE — Assessment & Plan Note (Signed)
Blood pressure looks ok. No change with her current medicines. 

## 2011-08-20 NOTE — Assessment & Plan Note (Addendum)
She has known severe AS with last echo in 2010 showing an AVA of .77. She has had prior discussions with Dr. Elease Hashimoto and has in the past has opted to not have valve replacement. i have spoken to Dr. Elease Hashimoto by phone today and he continues to opt for this approach. She is not felt to need further testing. She also continues to opt for conservative management.

## 2011-08-20 NOTE — Assessment & Plan Note (Signed)
She has had no more chest pain. Her troponins were negative. CKMB's are chronically elevated apparently. Will continue to manage her conservatively. We will recheck her BMET today. She will see Dr. Elease Hashimoto in 3 months. Patient is agreeable to this plan and will call if any problems develop in the interim.

## 2011-08-20 NOTE — Patient Instructions (Addendum)
Stay on your current medicines.  We will recheck your potassium level today.  I will have you see Dr. Elease Hashimoto in about 3 months.  Call the [facility] office at [phone] if you have any questions, problems or concerns.

## 2011-08-20 NOTE — Progress Notes (Signed)
[place] Date of Birth: [birth year] Medical Record #[id number]  History of Present Illness: Erin Charles is seen today for a post hospital visit. She is seen for Dr. Elease Hashimoto. She is an 78 year old female with known severe AS. She was admitted earlier this month with chest pain. Her troponins were negative. She has chronically elevated CK MB's. She was hypokalemic and this will need to be rechecked today.   She comes in today. She is doing ok. She has had no further episodes of chest pain. She feels good. She is limited mostly by her arthritis but is still able to get around. Not short of breath. No syncopal episodes. She continues to only want conservative measures.   Current Outpatient Prescriptions on File Prior to Visit  Medication Sig Dispense Refill  . ALPRAZolam (XANAX) 0.25 MG tablet Take 0.25 mg by mouth every 8 (eight) hours as needed. For anxiety      . aspirin 325 MG tablet Take 325 mg by mouth daily.        Marland Kitchen estrogens, conjugated, (PREMARIN) 0.3 MG tablet Take 0.3 mg by mouth daily. Take daily for 21 days then do not take for 7 days.       . Eszopiclone (LUNESTA PO) Take 1 tablet by mouth at bedtime as needed.       . hydrochlorothiazide 25 MG tablet Take 25 mg by mouth daily.       . hydrocodone-acetaminophen (LORCET-HD) 5-500 MG per capsule Take 3 capsules by mouth as needed. For back pain      . IRON-VITAMIN C PO Take 1 tablet by mouth daily.       . medroxyPROGESTERone (PROVERA) 2.5 MG tablet Take 2.5 mg by mouth daily.        . metoprolol (TOPROL-XL) 50 MG 24 hr tablet Take 1 tablet (50 mg total) by mouth daily.  30 tablet  1  . potassium chloride (K-DUR) 10 MEQ tablet Take 1 tablet (10 mEq total) by mouth daily.  30 tablet  11  . quinapril (ACCUPRIL) 40 MG tablet Take 1 tablet (40 mg total) by mouth 2 (two) times daily at 10 AM and 5 PM.  60 tablet  11  . simvastatin (ZOCOR) 40 MG tablet Take 40 mg by mouth at bedtime.         Allergies  Allergen Reactions  .  Macrodantin     rash  . Penicillins     rash  . Tolectin (Tolmetin Sodium)     rash    Past Medical History  Diagnosis Date  . HTN (hypertension)   . Dyslipidemia   . Severe aortic stenosis     s/p echo in 2010 with AVA of .77  . Complete heart block     Pacer  . RBBB (right bundle branch block)   . Advanced age   . OA (osteoarthritis)     Past Surgical History  Procedure Date  . Insert / replace / remove pacemaker 08/30/10    DUAL CHAMBER/ST. JUDE  . Pilonidal cyst excision   . Hernia repair   . Eye surgery     History  Smoking status  . Never Smoker   Smokeless tobacco  . Never Used    History  Alcohol Use No    No family history on file.  Review of Systems: The review of systems is per the HPI.  All other systems were reviewed and are negative.  Physical Exam: BP 128/76  Pulse 76  Ht  4\' 11"  (1.499 m)  Wt 144 lb (65.318 kg)  BMI 29.08 kg/m2 Patient is very pleasant and in no acute distress. She is short and overweight. Skin is warm and dry. Color is normal.  HEENT is unremarkable. Normocephalic/atraumatic. PERRL. Sclera are nonicteric. Neck is supple. No masses. No JVD. Lungs are clear. Cardiac exam shows a regular rate and rhythm. She has an outflow murmur noted. Abdomen is soft. Extremities are without edema. Gait and ROM are intact. No gross neurologic deficits noted.   LABORATORY DATA: Lab Results  Component Value Date   WBC 6.0 08/04/2011   HGB 11.9* 08/04/2011   HCT 34.9* 08/04/2011   PLT 173 08/04/2011   GLUCOSE 93 08/04/2011   CHOL 87 08/04/2011   TRIG 157* 08/04/2011   HDL 28* 08/04/2011   LDLCALC 28 08/04/2011   ALT 21 06/14/2009   AST 26 06/14/2009   NA 135 08/04/2011   K 3.0* 08/04/2011   CL 97 08/04/2011   CREATININE 0.86 08/04/2011   BUN 13 08/04/2011   CO2 27 08/04/2011   TSH 1.502 08/29/2010   INR 1.15 08/04/2011   HGBA1C  Value: 6.1 (NOTE) The ADA recommends the following therapeutic goal for glycemic control related to Hgb A1c measurement: Goal of  therapy: <6.5 Hgb A1c  Reference: American Diabetes Association: Clinical Practice Recommendations 2010, Diabetes Care, 2010, 33: (Suppl  1). 06/15/2009     Assessment / Plan:

## 2011-10-25 ENCOUNTER — Other Ambulatory Visit: Payer: Self-pay | Admitting: Obstetrics and Gynecology

## 2011-11-12 ENCOUNTER — Other Ambulatory Visit: Payer: Self-pay | Admitting: Obstetrics and Gynecology

## 2011-11-12 DIAGNOSIS — R928 Other abnormal and inconclusive findings on diagnostic imaging of breast: Secondary | ICD-10-CM

## 2011-11-18 ENCOUNTER — Ambulatory Visit (INDEPENDENT_AMBULATORY_CARE_PROVIDER_SITE_OTHER): Payer: Federal, State, Local not specified - PPO | Admitting: Cardiovascular Disease

## 2011-11-18 ENCOUNTER — Encounter: Payer: Self-pay | Admitting: Cardiovascular Disease

## 2011-11-18 VITALS — BP 144/71 | HR 73 | Ht 59.0 in | Wt 139.0 lb

## 2011-11-18 DIAGNOSIS — I350 Nonrheumatic aortic (valve) stenosis: Secondary | ICD-10-CM

## 2011-11-18 DIAGNOSIS — I359 Nonrheumatic aortic valve disorder, unspecified: Secondary | ICD-10-CM

## 2011-11-18 NOTE — Progress Notes (Signed)
Colon Branch Date of Birth  [birth year]       [facility]    [place] [address]  [address] [place], [place]  [address]   [place], [place]  [address] [phone]     [phone]   Fax  [phone]    Fax [phone]  Problem List: 1. Hypertension 2. Aortic stenosis 3. Chest pain  History of Present Illness:  Pt is doing well. She denies any chest pain or shortness breath.  She recently had mammograms and needs to go back to the breast center for follow up.  Current Outpatient Prescriptions on File Prior to Visit  Medication Sig Dispense Refill  . ALPRAZolam (XANAX) 0.25 MG tablet Take 0.25 mg by mouth every 8 (eight) hours as needed. For anxiety      . aspirin 325 MG tablet Take 325 mg by mouth daily.        Marland Kitchen estrogens, conjugated, (PREMARIN) 0.3 MG tablet Take 0.3 mg by mouth daily. Take daily for 21 days then do not take for 7 days.       . Eszopiclone (LUNESTA PO) Take 1 tablet by mouth at bedtime as needed.       . hydrochlorothiazide 25 MG tablet Take 25 mg by mouth daily.       . hydrocodone-acetaminophen (LORCET-HD) 5-500 MG per capsule Take 3 capsules by mouth as needed. For back pain      . IRON-VITAMIN C PO Take 1 tablet by mouth daily.       . medroxyPROGESTERone (PROVERA) 2.5 MG tablet Take 2.5 mg by mouth daily.        . metoprolol (TOPROL-XL) 50 MG 24 hr tablet Take 1 tablet (50 mg total) by mouth daily.  30 tablet  1  . potassium chloride (K-DUR) 10 MEQ tablet Take 1 tablet (10 mEq total) by mouth daily.  30 tablet  11  . simvastatin (ZOCOR) 40 MG tablet Take 40 mg by mouth at bedtime.       . quinapril (ACCUPRIL) 40 MG tablet Take 1 tablet (40 mg total) by mouth 2 (two) times daily at 10 AM and 5 PM.  60 tablet  11    Allergies  Allergen Reactions  . Macrodantin     rash  . Penicillins     rash  . Tolectin (Tolmetin Sodium)     rash    Past Medical History  Diagnosis Date  . HTN (hypertension)     . Dyslipidemia   . Severe aortic stenosis     s/p echo in 2010 with AVA of .77  . Complete heart block     Pacer  . RBBB (right bundle branch block)   . Advanced age   . OA (osteoarthritis)     Past Surgical History  Procedure Date  . Insert / replace / remove pacemaker 08/30/10    DUAL CHAMBER/ST. JUDE  . Pilonidal cyst excision   . Hernia repair   . Eye surgery     History  Smoking status  . Never Smoker   Smokeless tobacco  . Never Used    History  Alcohol Use No    No family history on file.  Reviw of Systems:  Reviewed in the HPI.  All other systems are negative.  Physical Exam: Blood pressure 144/71, pulse 73, height 4\' 11"  (1.499 m), weight 139 lb (63.05 kg). General: Well developed, well nourished, in no acute distress.  Head: Normocephalic, atraumatic,  sclera non-icteric, mucus membranes are moist,   Neck: Supple. Carotids are 2 + without bruits. No JVD.  There is radiation of her systolic murmur up into her carotids.  Lungs: Clear bilaterally to auscultation.  Heart: regular rate.  normal  S1 S2. She has 3-4/6  Crescendo- decrescendo murmur at the lateral border radiating up to the carotids.  Abdomen: Soft, non-tender, non-distended with normal bowel sounds. No hepatomegaly. No rebound/guarding. No masses.  Msk:  Strength and tone are normal  Extremities: No clubbing or cyanosis. No edema.  Distal pedal pulses are 2+ and equal bilaterally.  Neuro: Alert and oriented X 3. Moves all extremities spontaneously.  Psych:  Responds to questions appropriately with a normal affect.  ECG:  Assessment / Plan:

## 2011-11-18 NOTE — Patient Instructions (Signed)
Your physician wants you to follow-up in:   6 MONTHS WITH DR Elease Hashimoto  You will receive a reminder letter in the mail two months in advance. If you don't receive a letter, please call our office to schedule the follow-up appointment. Your physician recommends that you continue on your current medications as directed. Please refer to the Current Medication list given to you today. Your physician has requested that you have an echocardiogram. Echocardiography is a painless test that uses sound waves to create images of your heart. It provides your doctor with information about the size and shape of your heart and how well your heart's chambers and valves are working. This procedure takes approximately one hour. There are no restrictions for this procedure. DX AS

## 2011-11-18 NOTE — Assessment & Plan Note (Signed)
Erin Charles seems to be doing fairly well. She has severe aortic stenosis by echo several years ago. We'll repeat her echocardiogram for further evaluation.  She was recently found to have abnormal mammograms. She will be visiting the breast center soon.  She's 9 is old and is basically asymptomatic. We've discussed aortic valve for placement of years. She's not sure that she would want to have it done. She may be a candidate for TAVR.

## 2011-11-19 ENCOUNTER — Other Ambulatory Visit: Payer: Self-pay | Admitting: Obstetrics and Gynecology

## 2011-11-20 ENCOUNTER — Ambulatory Visit
Admission: RE | Admit: 2011-11-20 | Discharge: 2011-11-20 | Disposition: A | Discharge status: Home / Self Care | Payer: Federal, State, Local not specified - PPO | Source: Ambulatory Visit | Attending: Obstetrics and Gynecology | Admitting: Obstetrics and Gynecology

## 2011-11-20 ENCOUNTER — Other Ambulatory Visit: Payer: Self-pay | Admitting: Obstetrics and Gynecology

## 2011-11-20 DIAGNOSIS — R928 Other abnormal and inconclusive findings on diagnostic imaging of breast: Secondary | ICD-10-CM

## 2011-11-21 ENCOUNTER — Ambulatory Visit
Admission: RE | Admit: 2011-11-21 | Discharge: 2011-11-21 | Disposition: A | Discharge status: Home / Self Care | Payer: Federal, State, Local not specified - PPO | Source: Ambulatory Visit | Attending: Obstetrics and Gynecology | Admitting: Obstetrics and Gynecology

## 2011-11-21 DIAGNOSIS — R928 Other abnormal and inconclusive findings on diagnostic imaging of breast: Secondary | ICD-10-CM

## 2011-11-28 ENCOUNTER — Ambulatory Visit (INDEPENDENT_AMBULATORY_CARE_PROVIDER_SITE_OTHER): Payer: Federal, State, Local not specified - PPO | Admitting: Surgery

## 2011-11-28 ENCOUNTER — Encounter (INDEPENDENT_AMBULATORY_CARE_PROVIDER_SITE_OTHER): Payer: Self-pay | Admitting: Surgery

## 2011-11-28 ENCOUNTER — Encounter (INDEPENDENT_AMBULATORY_CARE_PROVIDER_SITE_OTHER): Payer: Self-pay | Admitting: General Surgery

## 2011-11-28 VITALS — BP 142/80 | HR 45 | Temp 97.4°F | Resp 16 | Ht 59.0 in | Wt 138.0 lb

## 2011-11-28 DIAGNOSIS — D0512 Intraductal carcinoma in situ of left breast: Secondary | ICD-10-CM

## 2011-11-28 DIAGNOSIS — D0590 Unspecified type of carcinoma in situ of unspecified breast: Secondary | ICD-10-CM

## 2011-11-28 HISTORY — DX: Intraductal carcinoma in situ of left breast: D05.12

## 2011-11-28 NOTE — Patient Instructions (Signed)
We will call you next week to schedule the case if Dr. Elease Hashimoto clears your heart for surgery.

## 2011-11-28 NOTE — Progress Notes (Signed)
Patient ID: Erin Charles, female   DOB: 1922/12/22, 78 y.o.   MRN: [id number]  Chief Complaint  Patient presents with  . Breast Cancer  Cardiologist - Dr. Elease Hashimoto  HPI Erin Charles is a 78 y.o. female.  Referred by Dr. Judyann Munson with new diagnosis of left breast DCIS HPI 78 yo female presents after a routine screening mammogram.  She had not had a mammogram in 4 years.  She was found to have an abnormality in the upper outer quadrant of the left breast.  She underwent further workup at [facility] and had a stereotactic biopsy of the microcalcifications.  This returned a diagnosis of DCIS ER/PR +.  This lesion does not show up on ultrasound.  The microcalcifications measure 1.6 x 1.2 x 1.5 cm.  She cannot have a MRI due to her pacemaker.  Past Medical History  Diagnosis Date  . HTN (hypertension)   . Dyslipidemia   . Severe aortic stenosis     s/p echo in 2010 with AVA of .77  . Complete heart block     Pacer  . RBBB (right bundle branch block)   . Advanced age   . OA (osteoarthritis)   . Aortic stenosis   . DCIS (ductal carcinoma in situ) of breast, left 11/28/2011    Past Surgical History  Procedure Date  . Insert / replace / remove pacemaker 08/30/10    DUAL CHAMBER/ST. JUDE  . Pilonidal cyst excision   . Hernia repair   . Eye surgery   . Pacemaker insertion     No family history on file.  Social History History  Substance Use Topics  . Smoking status: Never Smoker   . Smokeless tobacco: Never Used  . Alcohol Use: No    Allergies  Allergen Reactions  . Macrodantin     rash  . Penicillins     rash  . Tolectin (Tolmetin Sodium)     rash    Current Outpatient Prescriptions  Medication Sig Dispense Refill  . ALPRAZolam (XANAX) 0.25 MG tablet Take 0.25 mg by mouth every 8 (eight) hours as needed. For anxiety      . aspirin 325 MG tablet Take 325 mg by mouth daily.        Marland Kitchen estrogens, conjugated, (PREMARIN) 0.3 MG tablet Take 0.3 mg by mouth daily. Take daily for 21 days  then do not take for 7 days.       . Eszopiclone (LUNESTA PO) Take 1 tablet by mouth at bedtime as needed.       . hydrochlorothiazide 25 MG tablet Take 25 mg by mouth daily.       . hydrocodone-acetaminophen (LORCET-HD) 5-500 MG per capsule Take 3 capsules by mouth as needed. For back pain      . IRON-VITAMIN C PO Take 1 tablet by mouth daily.       . medroxyPROGESTERone (PROVERA) 2.5 MG tablet Take 2.5 mg by mouth daily.        . metoprolol (TOPROL-XL) 50 MG 24 hr tablet Take 1 tablet (50 mg total) by mouth daily.  30 tablet  1  . potassium chloride (K-DUR) 10 MEQ tablet Take 1 tablet (10 mEq total) by mouth daily.  30 tablet  11  . quinapril (ACCUPRIL) 40 MG tablet Take 40 mg by mouth at bedtime.      . simvastatin (ZOCOR) 40 MG tablet Take 40 mg by mouth at bedtime.       . quinapril (ACCUPRIL) 40 MG tablet Take 1  tablet (40 mg total) by mouth 2 (two) times daily at 10 AM and 5 PM.  60 tablet  11    Review of Systems Review of Systems  Constitutional: Negative for fever, chills and unexpected weight change.  HENT: Negative for hearing loss, congestion, sore throat, trouble swallowing and voice change.   Eyes: Positive for visual disturbance.  Respiratory: Negative for cough and wheezing.   Cardiovascular: Negative for chest pain, palpitations and leg swelling.  Gastrointestinal: Negative for nausea, vomiting, abdominal pain, diarrhea, constipation, blood in stool, abdominal distention and anal bleeding.  Genitourinary: Negative for hematuria, vaginal bleeding and difficulty urinating.  Musculoskeletal: Positive for joint swelling and arthralgias.  Skin: Negative for rash and wound.  Neurological: Negative for seizures, syncope and headaches.  Hematological: Negative for adenopathy. Does not bruise/bleed easily.  Psychiatric/Behavioral: Negative for confusion.    Blood pressure 142/80, pulse 45, temperature 97.4 F (36.3 C), temperature source Temporal, resp. rate 16, height 4\' 11"   (1.499 m), weight 138 lb (62.596 kg).  Physical Exam Physical Exam Elderly female in NAD HEENT:  EOMI, sclera anicteric Neck:  No masses, no thyromegaly Lungs:  CTA bilaterally; normal respiratory effort Breasts - symmetric, no nipple retraction or discharge; no axillary lymphadenopathy; no palpable mass in the right breast; the lateral half of the left breast shows some ecchymosis with some palpable hematoma.  In the upper outer quadrant there is a skin lesion that appears to be acute ?tape burn?Marland Kitchen  Not infected CV:  Regular rate and rhythm; no murmurs Abd:  +bowel sounds, soft, non-tender, no masses Ext:  Well-perfused; no edema Skin:  Warm, dry; no sign of jaundice  Data Reviewed Mammogram/ pathology report  Assessment    Left breast - DCIS - upper outer quadrant    Plan    Will await cardiac clearance from Dr. Elease Hashimoto, then will plan a left needle-localized lumpectomy followed by probable hormonal treatment.  Stop hormone supplements now.  If she is not cleared for surgery, will refer to Oncology for possible hormone treatment only.  Will defer sentinel lymph node biopsy in this patient due to age and comorbidities.  The surgical procedure has been discussed with the patient.  Potential risks, benefits, alternative treatments, and expected outcomes have been explained.  All of the patient's questions at this time have been answered.  The likelihood of reaching the patient's treatment goal is good.  The patient understand the proposed surgical procedure and wishes to proceed.        Haydn Cush K. 11/28/2011, 3:50 PM

## 2011-11-29 ENCOUNTER — Ambulatory Visit (HOSPITAL_COMMUNITY): Payer: Federal, State, Local not specified - PPO | Attending: Cardiology

## 2011-11-29 ENCOUNTER — Other Ambulatory Visit: Payer: Self-pay

## 2011-11-29 DIAGNOSIS — I519 Heart disease, unspecified: Secondary | ICD-10-CM | POA: Insufficient documentation

## 2011-11-29 DIAGNOSIS — I10 Essential (primary) hypertension: Secondary | ICD-10-CM | POA: Insufficient documentation

## 2011-11-29 DIAGNOSIS — I350 Nonrheumatic aortic (valve) stenosis: Secondary | ICD-10-CM

## 2011-11-29 DIAGNOSIS — R079 Chest pain, unspecified: Secondary | ICD-10-CM | POA: Insufficient documentation

## 2011-11-29 DIAGNOSIS — E785 Hyperlipidemia, unspecified: Secondary | ICD-10-CM | POA: Insufficient documentation

## 2011-11-29 DIAGNOSIS — I4510 Unspecified right bundle-branch block: Secondary | ICD-10-CM | POA: Insufficient documentation

## 2011-11-29 DIAGNOSIS — I059 Rheumatic mitral valve disease, unspecified: Secondary | ICD-10-CM | POA: Insufficient documentation

## 2011-11-29 DIAGNOSIS — I517 Cardiomegaly: Secondary | ICD-10-CM | POA: Insufficient documentation

## 2011-11-29 DIAGNOSIS — I359 Nonrheumatic aortic valve disorder, unspecified: Secondary | ICD-10-CM

## 2011-12-02 ENCOUNTER — Telehealth (INDEPENDENT_AMBULATORY_CARE_PROVIDER_SITE_OTHER): Payer: Self-pay | Admitting: General Surgery

## 2011-12-02 ENCOUNTER — Encounter: Payer: Self-pay | Admitting: *Deleted

## 2011-12-02 NOTE — Telephone Encounter (Signed)
Dr Elease Hashimoto called today and stated that he is not clearing Mrs Erin Charles for surgery for left breast cancer. He will send Dr Corliss Skains more information or call when Dr Corliss Skains is back in town.

## 2011-12-03 ENCOUNTER — Encounter: Payer: Self-pay | Admitting: *Deleted

## 2011-12-09 ENCOUNTER — Telehealth: Payer: Self-pay | Admitting: *Deleted

## 2011-12-09 NOTE — Telephone Encounter (Signed)
Confirmed 12/19/11 appt w/ pt.  Mailed before appt letter & packet to pt.  Took paperwork to [facility].

## 2011-12-12 ENCOUNTER — Ambulatory Visit (INDEPENDENT_AMBULATORY_CARE_PROVIDER_SITE_OTHER): Payer: Federal, State, Local not specified - PPO | Admitting: Cardiology

## 2011-12-12 ENCOUNTER — Encounter: Payer: Self-pay | Admitting: Internal Medicine

## 2011-12-12 ENCOUNTER — Encounter: Payer: Self-pay | Admitting: Cardiology

## 2011-12-12 VITALS — BP 125/65 | HR 91 | Ht 59.0 in | Wt 139.4 lb

## 2011-12-12 DIAGNOSIS — Z950 Presence of cardiac pacemaker: Secondary | ICD-10-CM

## 2011-12-12 DIAGNOSIS — I442 Atrioventricular block, complete: Secondary | ICD-10-CM | POA: Insufficient documentation

## 2011-12-12 LAB — PACEMAKER DEVICE OBSERVATION
AL THRESHOLD: 0.5 V
BAMS-0001: 130 {beats}/min
BAMS-0003: 70 {beats}/min
BATTERY VOLTAGE: 2.96 V
DEVICE MODEL PM: 7210946
RV LEAD AMPLITUDE: 3.7 mv
RV LEAD THRESHOLD: 0.875 V

## 2011-12-12 NOTE — Patient Instructions (Addendum)
Your physician wants you to follow-up in: 6 months with Dr Taylor You will receive a reminder letter in the mail two months in advance. If you don't receive a letter, please call our office to schedule the follow-up appointment.  

## 2011-12-12 NOTE — Progress Notes (Signed)
ELECTROPHYSIOLOGY OFFICE NOTE  Patient ID: Erin Charles MRN: [id number], DOB/AGE: January 07, 1923   Date of Visit: 12/12/2011  Primary Physician: Catha Gosselin, MD Primary Cardiologist: Elease Hashimoto, MD Reason for Visit: Device follow-up  History of Present Illness Erin Charles is a pleasant 78 year old woman with CHB s/p PPM implantation March 2012 who presents today for follow-up. She reports she is feeling well and has no complaints. She denies chest pain, shortness of breath, palpitations, dizziness or syncope. She denies LE swelling, orthopnea or PND. She has been recently diagnosed with breast cancer and is planning to start chemotherapy and XRT.  Past Medical History  Diagnosis Date  . HTN (hypertension)   . Dyslipidemia   . Severe aortic stenosis   . Complete heart block s/p PPM ([employer])   . RBBB (right bundle branch block)   . Advanced age   . OA (osteoarthritis)   . Aortic stenosis   . DCIS (ductal carcinoma in situ) of breast, left 11/28/2011    Past Surgical History  Procedure Date  . Insert / replace / remove pacemaker 08/30/10    DUAL CHAMBER/ST. JUDE  . Pilonidal cyst excision   . Hernia repair   . Eye surgery   . Pacemaker insertion     Allergies/Intolerances Allergen Reactions  . Macrodantin     rash  . Penicillins     rash  . Tolectin (Tolmetin Sodium)     rash   Current Home Medications Medication Sig Dispense Refill  . ALPRAZolam (XANAX) 0.25 MG tablet Take 0.25 mg by mouth every 8 (eight) hours as needed. For anxiety      . aspirin 325 MG tablet Take 325 mg by mouth daily.        . Eszopiclone (LUNESTA PO) Take 1 tablet by mouth at bedtime as needed.       . hydrochlorothiazide 25 MG tablet Take 25 mg by mouth daily.       . hydrocodone-acetaminophen (LORCET-HD) 5-500 MG per capsule Take 3 capsules by mouth as needed. For back pain      . IRON-VITAMIN C PO Take 1 tablet by mouth daily.       . metoprolol (TOPROL-XL) 50 MG 24 hr tablet Take 1 tablet (50 mg  total) by mouth daily.  30 tablet  1  . potassium chloride (K-DUR) 10 MEQ tablet Take 1 tablet (10 mEq total) by mouth daily.  30 tablet  11  . quinapril (ACCUPRIL) 40 MG tablet Take 40 mg by mouth at bedtime.      . simvastatin (ZOCOR) 40 MG tablet Take 40 mg by mouth at bedtime.       . quinapril (ACCUPRIL) 40 MG tablet Take 1 tablet (40 mg total) by mouth 2 (two) times daily at 10 AM and 5 PM.  60 tablet  11    Social History Social History  . Marital Status: Married    Spouse Name: N/A    Number of Children: N/A  . Years of Education: N/A   Social History Main Topics  . Smoking status: Never Smoker   . Smokeless tobacco: Never Used  . Alcohol Use: No  . Drug Use: No  . Sexually Active: Not on file   Review of Systems General:  No chills, fever, night sweats or weight changes Cardiovascular:  No chest pain, dyspnea on exertion, edema, orthopnea, palpitations, paroxysmal nocturnal dyspnea Dermatological: No rash, lesions or masses Respiratory: No cough, dyspnea Urologic: No hematuria, dysuria Abdominal:   No nausea,  vomiting, diarrhea, bright red blood per rectum, melena, or hematemesis Neurologic:  No visual changes, weakness, changes in mental status All other systems reviewed and are otherwise negative except as noted above.  Physical Exam Blood pressure 125/65, pulse 91, height 4\' 11"  (1.499 m), weight 139 lb 6.4 oz (63.231 kg).  General: Well developed, well appearing 78 year old female in no acute distress. HEENT: Normocephalic, atraumatic. EOMs intact. Sclera nonicteric. Oropharynx clear.  Neck: Supple. No JVD. Lungs: Respirations regular and unlabored, CTA bilaterally. No wheezes, rales or rhonchi. Heart: RRR. S1, S2 present. III/VI systolic murmur noted, most prominent at RUSB. No diastolic murmurs, rub, S3 or S4. Abdomen: Soft, non-distended.  Extremities: No clubbing, cyanosis or edema. DP/PT/Radials 2+ and equal bilaterally. Psych: Normal affect. Neuro: Alert and  oriented X 3. Moves all extremities spontaneously.  Diagnostics Device interrogation today shows normal PPM function; underlying rhythm is CHB at 34 bpm; RV sensing was increased from 2.0 to 1.5 mV (R waves 3.7 mV in bipolar configuration); all testing was within normal range; there were no other programming changes made; see [employer] report  Assessment and Plan 1. CHB s/p PPM - device function normal; will notify Dr. Ladona Ridgel about upcoming XRT so that he can advise regarding PPM checks before, during and after her scheduled treatments; continue remote follow-up every 3 months; follow-up with Dr. Ladona Ridgel in 6 months    Signed, Rick Duff, PA-C 12/12/2011, 1:05 PM

## 2011-12-19 ENCOUNTER — Other Ambulatory Visit: Payer: Self-pay | Admitting: Oncology

## 2011-12-19 ENCOUNTER — Ambulatory Visit (HOSPITAL_BASED_OUTPATIENT_CLINIC_OR_DEPARTMENT_OTHER): Payer: Federal, State, Local not specified - PPO | Admitting: Oncology

## 2011-12-19 ENCOUNTER — Other Ambulatory Visit (HOSPITAL_BASED_OUTPATIENT_CLINIC_OR_DEPARTMENT_OTHER): Payer: Federal, State, Local not specified - PPO | Admitting: Lab

## 2011-12-19 ENCOUNTER — Ambulatory Visit: Payer: Federal, State, Local not specified - PPO

## 2011-12-19 VITALS — BP 187/79 | HR 65 | Temp 97.4°F | Ht 59.0 in | Wt 138.9 lb

## 2011-12-19 DIAGNOSIS — D0590 Unspecified type of carcinoma in situ of unspecified breast: Secondary | ICD-10-CM

## 2011-12-19 DIAGNOSIS — C50919 Malignant neoplasm of unspecified site of unspecified female breast: Secondary | ICD-10-CM

## 2011-12-19 DIAGNOSIS — D0510 Intraductal carcinoma in situ of unspecified breast: Secondary | ICD-10-CM

## 2011-12-19 LAB — COMPREHENSIVE METABOLIC PANEL
ALT: 15 U/L (ref 0–35)
AST: 21 U/L (ref 0–37)
Albumin: 4.2 g/dL (ref 3.5–5.2)
Alkaline Phosphatase: 92 U/L (ref 39–117)
Calcium: 10.1 mg/dL (ref 8.4–10.5)
Chloride: 100 mEq/L (ref 96–112)
Potassium: 3.7 mEq/L (ref 3.5–5.3)

## 2011-12-19 LAB — CBC WITH DIFFERENTIAL/PLATELET
BASO%: 0.8 % (ref 0.0–2.0)
EOS%: 3.3 % (ref 0.0–7.0)
HGB: 13 g/dL (ref 11.6–15.9)
MCH: 29.2 pg (ref 25.1–34.0)
MCHC: 33.3 g/dL (ref 31.5–36.0)
MONO%: 9.9 % (ref 0.0–14.0)
RBC: 4.43 10*6/uL (ref 3.70–5.45)
RDW: 15.1 % — ABNORMAL HIGH (ref 11.2–14.5)
lymph#: 1.8 10*3/uL (ref 0.9–3.3)

## 2011-12-20 NOTE — Progress Notes (Signed)
Erin Charles [id number] Nov 21, 1922 78 y.o. 12/20/2011 8:22 AM  CC  Mickie Hillier, MD [address] [place] [address]  REASON FOR CONSULTATION:  Recent diagnosis of DCIS  STAGE:  0   REFERRING PHYSICIAN: Dr. Marcille Blanco  HISTORY OF PRESENT ILLNESS:  Erin Charles is a 78 y.o. female.  Delightful woman who presents with an abnormal mammogram. She has not had a mammogram in approxi-4 years. Her gynecologist recommended one as she was prescribing her hormonal therapy. In note she did have an abnormal screening mammogram. A diagnostic mammogram performed 11/20/2011 showed a cluster of calcifications in the upper-outer quadrant measuring 1.6 x 1.2 x 1.5 cm. There is no physical correlate to this abnormality. Ultrasound showed a abnormality at 4:00 measuring 8 x 5 x 9 mm. A biopsy performed 11/20/2011 showed high-grade DCIS with comedonecrosis and calcifications ER +100% PR +54%.   Past Medical History Past Medical History  Diagnosis Date  . HTN (hypertension)   . Dyslipidemia   . Severe aortic stenosis     s/p echo in 2010 with AVA of .77  . Complete heart block     Pacer  . RBBB (right bundle branch block)   . Advanced age   . OA (osteoarthritis)   . Aortic stenosis   . DCIS (ductal carcinoma in situ) of breast, left 11/28/2011    Past Surgical History: Past Surgical History  Procedure Date  . Insert / replace / remove pacemaker 08/30/10    DUAL CHAMBER/[facility]  . Pilonidal cyst excision   . Hernia repair   . Eye surgery   . Pacemaker insertion     Family History: There is no history of breast or ovarian cancer in the family. One sister died of myocardial infarction, father died of heart failure mother had bladder cancer to Social History She and her husband are originally from [place] and has lived in [place] for 32 years, they have been married for 61 years. They have 4 children one in [place], 1 in [place] 1 in [place] and one in  the [place] area. They have 9 grandchildren and one great grandchild. 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 History  Substance Use Topics  . Smoking status: Never Smoker   . Smokeless tobacco: Never Used  . Alcohol Use: No    Allergies: Allergies  Allergen Reactions  . Macrodantin     rash  . Penicillins     rash  . Tolectin (Tolmetin Sodium)     rash    Current Medications: Current Outpatient Prescriptions  Medication Sig Dispense Refill  . ALPRAZolam (XANAX) 0.25 MG tablet Take 0.25 mg by mouth every 8 (eight) hours as needed. For anxiety      . aspirin 325 MG tablet Take 325 mg by mouth daily.        . Eszopiclone (LUNESTA PO) Take 1 tablet by mouth at bedtime as needed.       . hydrochlorothiazide 25 MG tablet Take 25 mg by mouth daily.       . hydrocodone-acetaminophen (LORCET-HD) 5-500 MG per capsule Take 3 capsules by mouth as needed. For back pain      . IRON-VITAMIN C PO Take 1 tablet by mouth daily.       . metoprolol (TOPROL-XL) 50 MG 24 hr tablet Take 1 tablet (50 mg total) by mouth daily.  30 tablet  1  . potassium chloride (K-DUR) 10 MEQ tablet Take 1  tablet (10 mEq total) by mouth daily.  30 tablet  11  . quinapril (ACCUPRIL) 40 MG tablet Take 40 mg by mouth at bedtime.      . simvastatin (ZOCOR) 40 MG tablet Take 40 mg by mouth at bedtime.       . quinapril (ACCUPRIL) 40 MG tablet Take 1 tablet (40 mg total) by mouth 2 (two) times daily at 10 AM and 5 PM.  60 tablet  11    OB/GYN History G6 P4  Menarche  15 Menopause in her 83s on Premarin Provera since   Prior History of Cancer: None  Health Maintenance:  Colonoscopy yes Bone Density not recent Last PAP smear within the past 4 years  ECOG PERFORMANCE STATUS: 1  Genetic Counseling/testing: No  REVIEW OF SYSTEMS:  A comprehensive review of systems was negative except for: Respiratory: positive for She gets dyspneic on exertion. She denies orthopnea. She denies  chest pain. She does have limitations in her exercise tolerance. She denies any dizziness.  PHYSICAL EXAMINATION: Blood pressure 187/79, pulse 65, temperature 97.4 F (36.3 C), temperature source Oral, height 4\' 11"  (1.499 m), weight 138 lb 14.4 oz (63.005 kg).  ZOX:WRUEA, healthy and no distress SKIN: skin color, texture, turgor are normal HEAD: Normocephalic EYES: normal EARS: External ears normal OROPHARYNX:no exudate and no erythema  NECK: supple, no adenopathy LYMPH:  no palpable lymphadenopathy BREAST:breasts appear normal, no suspicious masses, no skin or nipple changes or axillary nodes LUNGS: negative findings:  chest symmetric with normal A/P diameter HEART: She has a murmur or appreciable at the aortic area of radiation carotids grade 3/6 no bruit ABDOMEN:abdomen soft and non-tender BACK: Back symmetric, no curvature., No CVA tenderness EXTREMITIES:no edema, no skin discoloration  NEURO: alert & oriented x 3 with fluent speech     STUDIES/RESULTS: US Breast Bilateral  11/20/2011  *RADIOLOGY REPORT*  Clinical Data:  Abnormal bilateral screening mammogram  DIGITAL DIAGNOSTIC BILATERAL MAMMOGRAM  AND BILATERAL BREAST ULTRASOUND:  Comparison:  Analogue mammograms dated 07/20/2007 from [facility]  Findings:  There are asymmetric fibroglandular densities in the 6 o'clock regions of both breast.  There is no new discrete mass associated with these asymmetries.  Asymmetries are felt to likely be secondary to the difference an analogue versus digital technique.  There is a cluster of calcifications, some of which are branching in the upper outer quadrant of the left breast measuring 1.6 x 1.2 x 1.5 cm.  There is no associated mass.  On physical exam, I do not palpate a discrete mass in either breast.  Ultrasound is performed, showing there is a simple cyst in the right breast at 4 o'clock 10 cm from the nipple measuring 8 x 5 x 9 mm.  No abnormal shadowing or suspicious mass  was seen in the inferior regions of either breast.  No sonographic abnormality seen in the upper outer quadrant of the left breast.  IMPRESSION: Suspicious calcifications in the upper outer quadrant of the left breast.  Tissue sampling is recommended.  Stereotactic biopsy will be performed and dictated separately.  BI-RADS CATEGORY 4:  Suspicious abnormality - biopsy should be considered.  Original Report Authenticated By: Littie Deeds. Erin Charles, M.D.   Mm Breast Stereo Biopsy Left  11/20/2011  *RADIOLOGY REPORT*  Clinical Data:  Suspicious left breast calcifications  STEREOTACTIC-GUIDED VACUUM ASSISTED BIOPSY OF THE LEFT BREAST AND SPECIMEN RADIOGRAPH  I met with the patient and we discussed the procedure of stereotactic-guided biopsy, including benefits and alternatives. We discussed the  high likelihood of a successful procedure. We discussed the risks of the procedure, including infection, bleeding, tissue injury, clip migration, and inadequate sampling. Informed, written consent was given.  Using sterile technique, 2% lidocaine, stereotactic guidance, and a 9 gauge vacuum assisted device, biopsy was performed of calcifications in the upper outer quadrant of the left breast. Specimen radiograph was performed, showing calcifications were present in the tissue samples.  Specimens with calcifications are identified for pathology.  At the conclusion of the procedure, a tissue marker clip was deployed into the biopsy cavity.  Follow-up 2-view mammogram confirmed clip placement.  IMPRESSION: Stereotactic-guided biopsy of the left breast.  No apparent complications.  Original Report Authenticated By: Littie Deeds. Erin Charles, M.D.   Mm Breast Surgical Specimen  11/20/2011  *RADIOLOGY REPORT*  Clinical Data:  Suspicious left breast calcifications  STEREOTACTIC-GUIDED VACUUM ASSISTED BIOPSY OF THE LEFT BREAST AND SPECIMEN RADIOGRAPH  I met with the patient and we discussed the procedure of stereotactic-guided biopsy, including benefits  and alternatives. We discussed the high likelihood of a successful procedure. We discussed the risks of the procedure, including infection, bleeding, tissue injury, clip migration, and inadequate sampling. Informed, written consent was given.  Using sterile technique, 2% lidocaine, stereotactic guidance, and a 9 gauge vacuum assisted device, biopsy was performed of calcifications in the upper outer quadrant of the left breast. Specimen radiograph was performed, showing calcifications were present in the tissue samples.  Specimens with calcifications are identified for pathology.  At the conclusion of the procedure, a tissue marker clip was deployed into the biopsy cavity.  Follow-up 2-view mammogram confirmed clip placement.  IMPRESSION: Stereotactic-guided biopsy of the left breast.  No apparent complications.  Original Report Authenticated By: Littie Deeds. Erin Charles, M.D.   Mm Digital Diagnostic Bilat  11/20/2011  *RADIOLOGY REPORT*  Clinical Data:  Abnormal bilateral screening mammogram  DIGITAL DIAGNOSTIC BILATERAL MAMMOGRAM  AND BILATERAL BREAST ULTRASOUND:  Comparison:  Analogue mammograms dated 07/20/2007 from [facility]  Findings:  There are asymmetric fibroglandular densities in the 6 o'clock regions of both breast.  There is no new discrete mass associated with these asymmetries.  Asymmetries are felt to likely be secondary to the difference an analogue versus digital technique.  There is a cluster of calcifications, some of which are branching in the upper outer quadrant of the left breast measuring 1.6 x 1.2 x 1.5 cm.  There is no associated mass.  On physical exam, I do not palpate a discrete mass in either breast.  Ultrasound is performed, showing there is a simple cyst in the right breast at 4 o'clock 10 cm from the nipple measuring 8 x 5 x 9 mm.  No abnormal shadowing or suspicious mass was seen in the inferior regions of either breast.  No sonographic abnormality seen in the upper outer  quadrant of the left breast.  IMPRESSION: Suspicious calcifications in the upper outer quadrant of the left breast.  Tissue sampling is recommended.  Stereotactic biopsy will be performed and dictated separately.  BI-RADS CATEGORY 4:  Suspicious abnormality - biopsy should be considered.  Original Report Authenticated By: Littie Deeds. Erin Charles, M.D.   Mm Radiologist Eval And Mgmt  11/21/2011  *RADIOLOGY REPORT*  ESTABLISHED PATIENT OFFICE VISIT - LEVEL II [phone])  Chief Complaint:  Left breast calcifications  History:  78 year old female with history of left breast calcifications.  A stereotactic biopsy was done on 11/20/2011  Exam:  The biopsy site is clean and dry with Steri-Strips in place. A 4 x 5  cm area of ecchymosis is seen inferior to the skin incision site.  There is no erythema or discharge.  Assessment and Plan:  Biopsy reveals high grade ductal carcinoma in situ, concordant with the imaging findings.  Surgical consultation is made with Dr. Corliss Skains on May 30 of at 10:50 a.m.  MRI is deferred as the patient has a pacemaker in place.  Original Report Authenticated By: Hiram Gash, M.D.     LABS:    Chemistry      Component Value Date/Time   NA 137 12/19/2011 1614   K 3.7 12/19/2011 1614   CL 100 12/19/2011 1614   CO2 28 12/19/2011 1614   BUN 20 12/19/2011 1614   CREATININE 0.90 12/19/2011 1614      Component Value Date/Time   CALCIUM 10.1 12/19/2011 1614   ALKPHOS 92 12/19/2011 1614   AST 21 12/19/2011 1614   ALT 15 12/19/2011 1614   BILITOT 0.2* 12/19/2011 1614      Lab Results  Component Value Date   WBC 7.5 12/19/2011   HGB 13.0 12/19/2011   HCT 38.9 12/19/2011   MCV 87.7 12/19/2011   PLT 217 12/19/2011       PATHOLOGY: High-grade DCIS ER/PR positive with comedonecrosis  ASSESSMENT    Pleasant 78 year old woman with significant comorbid problems i.e. aortic stenosis making her not eligible for excision of her DCIS  Clinical Trial Eligibility: None due to comorbidities      PLAN:    I discussed this with her and her family at length. She has been on hormonal therapy for40 years if not longer. I explained by merely stopping her hormonal therapy I think she can actually to control her DCIS. I did suggest that we obtain a bone density test and see her again in a month's time. I think that AI therapy would also be appropriate in her case. I would be reluctant to give her tamoxifen and I would be concerned about her clotting risk. While AI is have not been routinely used the treatment of DCIS in this particular case it might be appropriate. She appears to agree with the overall plan.         Discussion: Patient is being treated per NCCN breast cancer care guidelines appropriate for stage. 0  Thank you so much for allowing me to participate in the care of [place]. I will continue to follow up the patient with you and assist in her care.  All questions were answered. The patient knows to call the clinic with any problems, questions or concerns. We can certainly see the patient much sooner if necessary.  I spent 40 minutes counseling the patient face to face. The total time spent in the appointment was 20 minutes.  Pierce Crane M.D., FRCP C.

## 2012-01-16 ENCOUNTER — Other Ambulatory Visit: Payer: Federal, State, Local not specified - PPO

## 2012-01-19 ENCOUNTER — Other Ambulatory Visit: Payer: Self-pay | Admitting: Physician Assistant

## 2012-01-19 DIAGNOSIS — D0510 Intraductal carcinoma in situ of unspecified breast: Secondary | ICD-10-CM

## 2012-01-20 ENCOUNTER — Ambulatory Visit (HOSPITAL_BASED_OUTPATIENT_CLINIC_OR_DEPARTMENT_OTHER): Payer: Federal, State, Local not specified - PPO | Admitting: Physician Assistant

## 2012-01-20 ENCOUNTER — Telehealth: Payer: Self-pay | Admitting: Oncology

## 2012-01-20 VITALS — BP 170/81 | HR 81 | Temp 98.4°F | Ht 59.0 in | Wt 140.6 lb

## 2012-01-20 DIAGNOSIS — D0510 Intraductal carcinoma in situ of unspecified breast: Secondary | ICD-10-CM

## 2012-01-20 DIAGNOSIS — D0590 Unspecified type of carcinoma in situ of unspecified breast: Secondary | ICD-10-CM

## 2012-01-20 NOTE — Progress Notes (Signed)
Hematology and Oncology Follow Up Visit  Erin Charles [id number] 1922-09-13 78 y.o. 01/20/2012    HPI: Erin Charles is an 78 year old [place] woman with a history of an 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 She underwent biopsy to confirm diagnosis, she was not felt to be a surgical candidate, therefore has not undergone definitive surgical intervention.   Interim History:   Patient returns today with her husband in accompaniment for followup of stage 0, ER/PR positive, DCIS of the left breast noted to be high grade with comedonecrosis and calcifications. She is scheduled for bone density on 01/23/2012. She has not initiated any form of adjuvant hormonal therapy at this time. She feels well, denying any unexplained fevers chills night sweats. No shortness of breath or chest pain. No nausea emesis diarrhea or constipation issues. She denies any diffuse bone pain, her energy level is quite good.  A detailed review of systems is otherwise noncontributory as noted below.  Review of Systems: Constitutional:  no weight loss, fever, night sweats and feels well Eyes: uses glasses ENT: no complaints Cardiovascular: no chest pain or dyspnea on exertion Respiratory: no cough, shortness of breath, or wheezing Neurological: no TIA or stroke symptoms Dermatological: negative Gastrointestinal: no abdominal pain, change in bowel habits, or black or bloody stools Genito-Urinary: no dysuria, trouble voiding, or hematuria Hematological and Lymphatic: negative Breast: negative Musculoskeletal: negative Remaining ROS negative.   Medications:   I have reviewed the patient's current medications.  Current Outpatient Prescriptions  Medication Sig Dispense Refill  . ALPRAZolam (XANAX) 0.25 MG tablet Take 0.25 mg by mouth every 8 (eight) hours as needed. For anxiety      . aspirin 325 MG tablet Take 325 mg by mouth daily.        . Eszopiclone  (LUNESTA PO) Take 1 tablet by mouth at bedtime as needed.       . hydrochlorothiazide 25 MG tablet Take 25 mg by mouth daily.       . hydrocodone-acetaminophen (LORCET-HD) 5-500 MG per capsule Take 3 capsules by mouth as needed. For back pain      . IRON-VITAMIN C PO Take 1 tablet by mouth daily.       . metoprolol (TOPROL-XL) 50 MG 24 hr tablet Take 1 tablet (50 mg total) by mouth daily.  30 tablet  1  . potassium chloride (K-DUR) 10 MEQ tablet Take 1 tablet (10 mEq total) by mouth daily.  30 tablet  11  . quinapril (ACCUPRIL) 40 MG tablet Take 1 tablet (40 mg total) by mouth 2 (two) times daily at 10 AM and 5 PM.  60 tablet  11  . quinapril (ACCUPRIL) 40 MG tablet Take 40 mg by mouth at bedtime.      . simvastatin (ZOCOR) 40 MG tablet Take 40 mg by mouth at bedtime.         Allergies:  Allergies  Allergen Reactions  . Macrodantin     rash  . Penicillins     rash  . Tolectin (Tolmetin Sodium)     rash    Physical Exam: Filed Vitals:   01/20/12 1104  BP: 170/81  Pulse: 81  Temp: 98.4 F (36.9 C)    Body mass index is 28.40 kg/(m^2). Weight: 140 lbs. HEENT:  Sclerae anicteric, conjunctivae pink.  Oropharynx clear.  No mucositis or candidiasis.   Nodes:  No cervical, supraclavicular, or axillary lymphadenopathy palpated.  Breast Exam: Deferred. Lungs:  Clear to auscultation bilaterally.  No  crackles, rhonchi, or wheezes.   Heart:  Regular rate and rhythm.   Abdomen:  Soft, nontender.  Positive bowel sounds.  No organomegaly or masses palpated.   Musculoskeletal:  No focal spinal tenderness to palpation.  Extremities:  Benign.  No peripheral edema or cyanosis.   Skin:  Benign.   Neuro:  Nonfocal, alert and oriented x 3.   Lab Results: Lab Results  Component Value Date   WBC 7.5 12/19/2011   HGB 13.0 12/19/2011   HCT 38.9 12/19/2011   MCV 87.7 12/19/2011   PLT 217 12/19/2011   NEUTROABS 4.7 12/19/2011     Chemistry      Component Value Date/Time   NA 137 12/19/2011 1614     K 3.7 12/19/2011 1614   CL 100 12/19/2011 1614   CO2 28 12/19/2011 1614   BUN 20 12/19/2011 1614   CREATININE 0.90 12/19/2011 1614      Component Value Date/Time   CALCIUM 10.1 12/19/2011 1614   ALKPHOS 92 12/19/2011 1614   AST 21 12/19/2011 1614   ALT 15 12/19/2011 1614   BILITOT 0.2* 12/19/2011 1614      No results found for this basename: LABCA2    Assessment:  Erin Charles is an 78 year old [place] woman with a history of an 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 She underwent biopsy to confirm diagnosis, she was not felt to be a surgical candidate, therefore has not undergone definitive surgical intervention.   Case has been reviewed with Dr. Pierce Crane.  Plan:  Erin Charles was encouraged to keep her appointment for scheduled full body bone density on 01/23/2012. In the interim, will she will initiate anastrozole 1 mg by mouth daily. Side effect profile has been reviewed. We'll plan on seeing her back in 3 months for routine followup, the patient and her husband know to contact us sooner if the need should arise.  This plan was reviewed with the patient, who voices understanding and agreement.  She knows to call with any changes or problems.    Anastasha Ortez T, PA-C 01/20/2012

## 2012-01-20 NOTE — Telephone Encounter (Signed)
gve the pt her oct 2013 appt calendar °

## 2012-01-23 ENCOUNTER — Ambulatory Visit
Admission: RE | Admit: 2012-01-23 | Discharge: 2012-01-23 | Disposition: A | Discharge status: Home / Self Care | Payer: Federal, State, Local not specified - PPO | Source: Ambulatory Visit | Attending: Oncology | Admitting: Oncology

## 2012-01-23 DIAGNOSIS — C50919 Malignant neoplasm of unspecified site of unspecified female breast: Secondary | ICD-10-CM

## 2012-01-28 ENCOUNTER — Other Ambulatory Visit: Payer: Federal, State, Local not specified - PPO

## 2012-03-10 ENCOUNTER — Telehealth: Payer: Self-pay | Admitting: Internal Medicine

## 2012-03-10 ENCOUNTER — Other Ambulatory Visit: Payer: Self-pay | Admitting: Cardiovascular Disease

## 2012-03-10 NOTE — Telephone Encounter (Signed)
Pt heart feels like it is beating out of rhythm and she was wondering should come in to be seen no chest pain or any discomfort

## 2012-03-10 NOTE — Telephone Encounter (Signed)
Pt not feeling well last couple of days. Checked blood pressure and heart rate and both was normal. Pt feeling much better this afternoon. appt was made for Dr Elease Hashimoto in November. Heart rate was 67 when checked. Pt to call if anymore problems.

## 2012-03-16 ENCOUNTER — Encounter: Payer: Self-pay | Admitting: *Deleted

## 2012-03-16 ENCOUNTER — Ambulatory Visit (INDEPENDENT_AMBULATORY_CARE_PROVIDER_SITE_OTHER): Payer: Federal, State, Local not specified - PPO | Admitting: *Deleted

## 2012-03-16 ENCOUNTER — Encounter: Payer: Self-pay | Admitting: Internal Medicine

## 2012-03-16 DIAGNOSIS — I442 Atrioventricular block, complete: Secondary | ICD-10-CM

## 2012-03-16 LAB — REMOTE PACEMAKER DEVICE
AL AMPLITUDE: 1.9 mv
AL IMPEDENCE PM: 630 Ohm
BAMS-0001: 130 {beats}/min
BAMS-0003: 70 {beats}/min
BATTERY VOLTAGE: 2.95 V
RV LEAD AMPLITUDE: 12 mv
VENTRICULAR PACING PM: 99

## 2012-03-25 ENCOUNTER — Encounter: Payer: Self-pay | Admitting: *Deleted

## 2012-04-23 ENCOUNTER — Ambulatory Visit (HOSPITAL_BASED_OUTPATIENT_CLINIC_OR_DEPARTMENT_OTHER): Payer: Federal, State, Local not specified - PPO | Admitting: Oncology

## 2012-04-23 ENCOUNTER — Telehealth: Payer: Self-pay | Admitting: *Deleted

## 2012-04-23 VITALS — BP 178/61 | HR 77 | Temp 97.5°F | Resp 20 | Ht 59.0 in | Wt 142.8 lb

## 2012-04-23 DIAGNOSIS — D0590 Unspecified type of carcinoma in situ of unspecified breast: Secondary | ICD-10-CM

## 2012-04-23 DIAGNOSIS — R61 Generalized hyperhidrosis: Secondary | ICD-10-CM

## 2012-04-23 DIAGNOSIS — Z170 Estrogen receptor positive status [ER+]: Secondary | ICD-10-CM

## 2012-04-23 DIAGNOSIS — D0510 Intraductal carcinoma in situ of unspecified breast: Secondary | ICD-10-CM

## 2012-04-23 NOTE — Patient Instructions (Signed)
PLS TAKE PERIDIN C ,  2 PILLS , 3 TIMES PER DAY FOR HOT FLASHES.

## 2012-04-23 NOTE — Telephone Encounter (Signed)
Gave  Patient appointment for mammogram at the breast center on 11-24-2012 at 11:00am  Gave patient appointment for 10-27-2012 starting at 11:00am  Patient aware of all the appointments

## 2012-04-23 NOTE — Progress Notes (Signed)
Hematology and Oncology Follow Up Visit  Erin Charles [id number] May 16, 1923 78 y.o. 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 She underwent biopsy to confirm diagnosis, she was not felt to be a surgical candidate, therefore has not undergone definitive surgical intervention.   Interim History:   Patient returns today with her husband in accompaniment for followup of stage 0, ER/PR positive, DCIS of the left breast noted to be high grade with comedonecrosis and calcifications. She is scheduled for bone density on 01/23/2012. She has not initiated any form of adjuvant hormonal therapy at this time. She feels well, denying any unexplained fevers chills night sweats. No shortness of breath or chest pain. No nausea emesis diarrhea or constipation issues. She denies any diffuse bone pain, her energy level is quite good.  A detailed review of systems is otherwise noncontributory as noted below.  Review of Systems: Constitutional:  no weight loss, fever, night sweats and feels well Eyes: uses glasses ENT: no complaints Cardiovascular: no chest pain or dyspnea on exertion Respiratory: no cough, shortness of breath, or wheezing Neurological: no TIA or stroke symptoms Dermatological: negative Gastrointestinal: no abdominal pain, change in bowel habits, or black or bloody stools Genito-Urinary: no dysuria, trouble voiding, or hematuria Hematological and Lymphatic: negative Breast: negative Musculoskeletal: negative Remaining ROS negative.   Medications:   I have reviewed the patient's current medications.  Current Outpatient Prescriptions  Medication Sig Dispense Refill  . ALPRAZolam (XANAX) 0.25 MG tablet Take 0.25 mg by mouth every 8 (eight) hours as needed. For anxiety      . anastrozole (ARIMIDEX) 1 MG tablet Take 1 mg by mouth daily.      Marland Kitchen  aspirin 325 MG tablet Take 325 mg by mouth daily.        . Eszopiclone (LUNESTA PO) Take 1 tablet by mouth at bedtime as needed.       . hydrochlorothiazide 25 MG tablet Take 25 mg by mouth daily.       . hydrocodone-acetaminophen (LORCET-HD) 5-500 MG per capsule Take 3 capsules by mouth as needed. For back pain      . IRON-VITAMIN C PO Take 1 tablet by mouth daily.       . metoprolol (TOPROL-XL) 50 MG 24 hr tablet Take 1 tablet (50 mg total) by mouth daily.  30 tablet  1  . potassium chloride (K-DUR) 10 MEQ tablet Take 1 tablet (10 mEq total) by mouth daily.  30 tablet  11  . quinapril (ACCUPRIL) 40 MG tablet Take 40 mg by mouth at bedtime.      . simvastatin (ZOCOR) 40 MG tablet Take 40 mg by mouth at bedtime.       . metoprolol succinate (TOPROL-XL) 50 MG 24 hr tablet TAKE 1 TABLET DAILY  30 tablet  3  . quinapril (ACCUPRIL) 40 MG tablet Take 1 tablet (40 mg total) by mouth 2 (two) times daily at 10 AM and 5 PM.  60 tablet  11  . zolpidem (AMBIEN) 10 MG tablet         Allergies:  Allergies  Allergen Reactions  . Macrodantin     rash  . Penicillins     rash  . Tolectin (Tolmetin Sodium)     rash    Physical Exam: Filed Vitals:   04/23/12 1229  BP: 178/61  Pulse: 77  Temp: 97.5 F (36.4 C)  Resp: 20    Body mass index is 28.84 kg/(m^2). Weight: 140 lbs. HEENT:  Sclerae anicteric, conjunctivae pink.  Oropharynx clear.  No mucositis or candidiasis.   Nodes:  No cervical, supraclavicular, or axillary lymphadenopathy palpated.  Breast Exam: Deferred. Lungs:  Clear to auscultation bilaterally.  No crackles, rhonchi, or wheezes.   Heart:  Regular rate and rhythm.   Abdomen:  Soft, nontender.  Positive bowel sounds.  No organomegaly or masses palpated.   Musculoskeletal:  No focal spinal tenderness to palpation.  Extremities:  Benign.  No peripheral edema or cyanosis.   Skin:  Benign.   Neuro:  Nonfocal, alert and oriented x 3.   Lab Results: Lab Results  Component Value  Date   WBC 7.5 12/19/2011   HGB 13.0 12/19/2011   HCT 38.9 12/19/2011   MCV 87.7 12/19/2011   PLT 217 12/19/2011   NEUTROABS 4.7 12/19/2011     Chemistry      Component Value Date/Time   NA 137 12/19/2011 1614   K 3.7 12/19/2011 1614   CL 100 12/19/2011 1614   CO2 28 12/19/2011 1614   BUN 20 12/19/2011 1614   CREATININE 0.90 12/19/2011 1614      Component Value Date/Time   CALCIUM 10.1 12/19/2011 1614   ALKPHOS 92 12/19/2011 1614   AST 21 12/19/2011 1614   ALT 15 12/19/2011 1614   BILITOT 0.2* 12/19/2011 1614      No results found for this basename: LABCA2    Assessment:  Erin Charles is an 78 year old [place] woman with a history of an 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 She underwent biopsy to confirm diagnosis, she was not felt to be a surgical candidate, therefore has not undergone definitive surgical intervention.   Case has been reviewed with Dr. Pierce Crane.  Plan:  Erin Charles was encouraged to keep her appointment for scheduled full body bone density on 01/23/2012. In the interim, will she will initiate anastrozole 1 mg by mouth daily. Side effect profile has been reviewed. We'll plan on seeing her back in 3 months for routine followup, the patient and her husband know to contact us sooner if the need should arise.  This plan was reviewed with the patient, who voices understanding and agreement.  She knows to call with any changes or problems.    Pierce Crane, PA-C 04/23/2012

## 2012-04-23 NOTE — Progress Notes (Signed)
Hematology and Oncology Follow Up Visit  Erin Charles [id number] 02/03/1923 78 y.o. 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 She underwent biopsy to confirm diagnosis, she was not felt to be a surgical candidate, therefore has not undergone definitive surgical intervention.   Interim History:   Patient returns today with her husband in accompaniment for followup of stage 0, ER/PR positive, DCIS of the left breast noted to be high grade with comedonecrosis and calcifications. She has been on Arimidex for about 3 months. She is having hot flashes. She had a bone density test done which actually was quite normal. She has no other complaints. She feels pretty well. A detailed review of systems is otherwise noncontributory as noted below.  Review of Systems: Constitutional:  no weight loss, fever, night sweats and feels well Eyes: uses glasses ENT: no complaints Cardiovascular: no chest pain or dyspnea on exertion Respiratory: no cough, shortness of breath, or wheezing Neurological: no TIA or stroke symptoms Dermatological: negative Gastrointestinal: no abdominal pain, change in bowel habits, or black or bloody stools Genito-Urinary: no dysuria, trouble voiding, or hematuria Hematological and Lymphatic: negative Breast: negative Musculoskeletal: negative Remaining ROS negative.   Medications:   I have reviewed the patient's current medications.  Current Outpatient Prescriptions  Medication Sig Dispense Refill  . ALPRAZolam (XANAX) 0.25 MG tablet Take 0.25 mg by mouth every 8 (eight) hours as needed. For anxiety      . anastrozole (ARIMIDEX) 1 MG tablet Take 1 mg by mouth daily.      Marland Kitchen aspirin 325 MG tablet Take 325 mg by mouth daily.        . Eszopiclone (LUNESTA PO) Take 1 tablet by mouth at bedtime as needed.       . hydrochlorothiazide 25  MG tablet Take 25 mg by mouth daily.       . hydrocodone-acetaminophen (LORCET-HD) 5-500 MG per capsule Take 3 capsules by mouth as needed. For back pain      . IRON-VITAMIN C PO Take 1 tablet by mouth daily.       . metoprolol (TOPROL-XL) 50 MG 24 hr tablet Take 1 tablet (50 mg total) by mouth daily.  30 tablet  1  . potassium chloride (K-DUR) 10 MEQ tablet Take 1 tablet (10 mEq total) by mouth daily.  30 tablet  11  . quinapril (ACCUPRIL) 40 MG tablet Take 40 mg by mouth at bedtime.      . simvastatin (ZOCOR) 40 MG tablet Take 40 mg by mouth at bedtime.       . metoprolol succinate (TOPROL-XL) 50 MG 24 hr tablet TAKE 1 TABLET DAILY  30 tablet  3  . quinapril (ACCUPRIL) 40 MG tablet Take 1 tablet (40 mg total) by mouth 2 (two) times daily at 10 AM and 5 PM.  60 tablet  11  . zolpidem (AMBIEN) 10 MG tablet         Allergies:  Allergies  Allergen Reactions  . Macrodantin     rash  . Penicillins     rash  . Tolectin (Tolmetin Sodium)     rash    Physical Exam: Filed Vitals:   04/23/12 1229  BP: 178/61  Pulse: 77  Temp: 97.5 F (36.4 C)  Resp: 20    Body mass index is 28.84 kg/(m^2). Weight: 140 lbs. HEENT:  Sclerae anicteric, conjunctivae pink.  Oropharynx clear.  No mucositis or candidiasis.   Nodes:  No cervical, supraclavicular, or axillary lymphadenopathy palpated.  Breast Exam: Right and left breasts are large pendulous and free of any obvious masses. Both axilla negative. Lungs:  Clear to auscultation bilaterally.  No crackles, rhonchi, or wheezes.   Heart:  Regular rate and rhythm.   Abdomen:  Soft, nontender.  Positive bowel sounds.  No organomegaly or masses palpated.   Musculoskeletal:  No focal spinal tenderness to palpation.  Extremities:  Benign.  No peripheral edema or cyanosis.   Skin:  Benign.   Neuro:  Nonfocal, alert and oriented x 3.   Lab Results: Lab Results  Component Value Date   WBC 7.5 12/19/2011   HGB 13.0 12/19/2011   HCT 38.9 12/19/2011   MCV  87.7 12/19/2011   PLT 217 12/19/2011   NEUTROABS 4.7 12/19/2011     Chemistry      Component Value Date/Time   NA 137 12/19/2011 1614   K 3.7 12/19/2011 1614   CL 100 12/19/2011 1614   CO2 28 12/19/2011 1614   BUN 20 12/19/2011 1614   CREATININE 0.90 12/19/2011 1614      Component Value Date/Time   CALCIUM 10.1 12/19/2011 1614   ALKPHOS 92 12/19/2011 1614   AST 21 12/19/2011 1614   ALT 15 12/19/2011 1614   BILITOT 0.2* 12/19/2011 1614      No results found for this basename: LABCA2    Assessment:  Erin Charles is an 78 year old [place] woman with a history of an 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 She is doing well on this apart from hot flashes Plan:  I have recommended she try peridin c for hot flashes. I've also recommended a mammogram of the left breast in November. I will see her in 6 months time.  This plan was reviewed with the patient, who voices understanding and agreement.  She knows to call with any changes or problems.    Pierce Crane, MD 04/23/2012

## 2012-05-19 ENCOUNTER — Encounter: Payer: Self-pay | Admitting: Cardiovascular Disease

## 2012-05-19 ENCOUNTER — Ambulatory Visit (INDEPENDENT_AMBULATORY_CARE_PROVIDER_SITE_OTHER): Payer: Federal, State, Local not specified - PPO | Admitting: Cardiovascular Disease

## 2012-05-19 VITALS — BP 136/82 | HR 43 | Ht 59.0 in | Wt 143.0 lb

## 2012-05-19 DIAGNOSIS — I359 Nonrheumatic aortic valve disorder, unspecified: Secondary | ICD-10-CM

## 2012-05-19 DIAGNOSIS — I350 Nonrheumatic aortic (valve) stenosis: Secondary | ICD-10-CM

## 2012-05-19 NOTE — Patient Instructions (Addendum)
Your physician wants you to follow-up in: 6 months  You will receive a reminder letter in the mail two months in advance. If you don't receive a letter, please call our office to schedule the follow-up appointment.  Your physician recommends that you continue on your current medications as directed. Please refer to the Current Medication list given to you today.  

## 2012-05-19 NOTE — Progress Notes (Signed)
Erin Charles Date of Birth  12-28-1922       [facility]    [place] [address]  [address] [place], [place]  [address]   [place], [place]  [address] [phone]     [phone]   Fax  [phone]    Fax [phone]  Problem List: 1. Hypertension 2. Aortic stenosis 3. Chest pain 4. Pre-canerous breast mass 5. Macular degeneration  History of Present Illness:  Pt is doing well. She denies any chest pain or shortness breath.    She was thought to have breast cancer but it turned out to be pre-cancerous and is on medical therapy.  She overall is doing well. She's not had any worsening of her chest pain or shortness breath. She's able to do all of her typical chores. She has macular degeneration so she's not driving.  Current Outpatient Prescriptions on File Prior to Visit  Medication Sig Dispense Refill  . ALPRAZolam (XANAX) 0.25 MG tablet Take 0.25 mg by mouth every 8 (eight) hours as needed. For anxiety      . anastrozole (ARIMIDEX) 1 MG tablet Take 1 mg by mouth daily.      Marland Kitchen aspirin 325 MG tablet Take 325 mg by mouth daily.        . Eszopiclone (LUNESTA PO) Take 1 tablet by mouth at bedtime as needed.       . hydrochlorothiazide 25 MG tablet Take 25 mg by mouth daily.       . hydrocodone-acetaminophen (LORCET-HD) 5-500 MG per capsule Take 3 capsules by mouth as needed. For back pain      . IRON-VITAMIN C PO Take 1 tablet by mouth daily.       . metoprolol succinate (TOPROL-XL) 50 MG 24 hr tablet TAKE 1 TABLET DAILY  30 tablet  3  . potassium chloride (K-DUR) 10 MEQ tablet Take 1 tablet (10 mEq total) by mouth daily.  30 tablet  11  . quinapril (ACCUPRIL) 40 MG tablet Take 40 mg by mouth at bedtime.      . simvastatin (ZOCOR) 40 MG tablet Take 40 mg by mouth at bedtime.       Marland Kitchen zolpidem (AMBIEN) 10 MG tablet       . [DISCONTINUED] metoprolol (TOPROL-XL) 50 MG 24 hr tablet Take 1 tablet (50 mg total) by mouth daily.  30  tablet  1  . [DISCONTINUED] quinapril (ACCUPRIL) 40 MG tablet Take 1 tablet (40 mg total) by mouth 2 (two) times daily at 10 AM and 5 PM.  60 tablet  11    Allergies  Allergen Reactions  . Macrodantin     rash  . Penicillins     rash  . Tolectin (Tolmetin Sodium)     rash    Past Medical History  Diagnosis Date  . HTN (hypertension)   . Dyslipidemia   . Severe aortic stenosis     s/p echo in 2010 with AVA of .77  . Complete heart block     Pacer  . RBBB (right bundle Charles block)   . Advanced age   . OA (osteoarthritis)   . Aortic stenosis   . DCIS (ductal carcinoma in situ) of breast, left 11/28/2011    Past Surgical History  Procedure Date  . Insert / replace / remove pacemaker 08/30/10    DUAL CHAMBER/ST. JUDE  . Pilonidal cyst excision   . Hernia repair   . Eye surgery   .  Pacemaker insertion     History  Smoking status  . Never Smoker   Smokeless tobacco  . Never Used    History  Alcohol Use No    No family history on file.  Reviw of Systems:  Reviewed in the HPI.  All other systems are negative.  Physical Exam: Blood pressure 136/82, pulse 43, height 4\' 11"  (1.499 m), weight 143 lb (64.864 kg), SpO2 96.00%. General: Well developed, well nourished, in no acute distress.  Head: Normocephalic, atraumatic, sclera non-icteric, mucus membranes are moist,   Neck: Supple. Carotids are 2 + without bruits. No JVD.  There is radiation of her systolic murmur up into her carotids.  Lungs: Clear bilaterally to auscultation.  Heart: regular rate.  normal  S1 S2. She has 3-4/6  Crescendo- decrescendo murmur at the lateral border radiating up to the carotids.  Abdomen: Soft, non-tender, non-distended with normal bowel sounds. No hepatomegaly. No rebound/guarding. No masses.  Msk:  Strength and tone are normal  Extremities: No clubbing or cyanosis. No edema.  Distal pedal pulses are 2+ and equal bilaterally.  Neuro: Alert and oriented X 3. Moves all  extremities spontaneously.  Psych:  Responds to questions appropriately with a normal affect.  ECG:  Assessment / Plan:

## 2012-05-19 NOTE — Assessment & Plan Note (Signed)
Erin Charles is stable. She has fairly severe aortic stenosis but she is asymptomatic. We discussed TAVR.  She is fairly certain that she does not want to have a standard aortic valve replacement. If she becomes symptomatic we'll have her see Dr. Excell Seltzer for further evaluation.

## 2012-06-22 ENCOUNTER — Encounter: Payer: Self-pay | Admitting: Internal Medicine

## 2012-06-22 ENCOUNTER — Ambulatory Visit (INDEPENDENT_AMBULATORY_CARE_PROVIDER_SITE_OTHER): Payer: Federal, State, Local not specified - PPO | Admitting: *Deleted

## 2012-06-22 DIAGNOSIS — I442 Atrioventricular block, complete: Secondary | ICD-10-CM

## 2012-06-22 DIAGNOSIS — Z950 Presence of cardiac pacemaker: Secondary | ICD-10-CM

## 2012-06-22 LAB — REMOTE PACEMAKER DEVICE
AL AMPLITUDE: 2.1 mv
ATRIAL PACING PM: 25
BAMS-0001: 130 {beats}/min
BATTERY VOLTAGE: 2.95 V
BRDY-0002RV: 60 {beats}/min
BRDY-0004RV: 110 {beats}/min
RV LEAD IMPEDENCE PM: 360 Ohm
VENTRICULAR PACING PM: 99

## 2012-07-02 ENCOUNTER — Other Ambulatory Visit: Payer: Self-pay | Admitting: *Deleted

## 2012-07-02 MED ORDER — METOPROLOL SUCCINATE ER 50 MG PO TB24: 50.0000 mg | ORAL_TABLET | Freq: Every day | ORAL | Status: DC

## 2012-07-02 NOTE — Telephone Encounter (Signed)
Fax Received. Refill Completed. Meaghen Vecchiarelli Chowoe (R.M.A)   

## 2012-07-03 ENCOUNTER — Encounter: Payer: Self-pay | Admitting: *Deleted

## 2012-07-03 ENCOUNTER — Other Ambulatory Visit: Payer: Self-pay | Admitting: *Deleted

## 2012-07-03 NOTE — Telephone Encounter (Signed)
Opened in Error.

## 2012-08-21 ENCOUNTER — Telehealth: Payer: Self-pay | Admitting: *Deleted

## 2012-08-21 NOTE — Telephone Encounter (Signed)
Pt request Dr. Welton Flakes.  Confirmed appt date and time for 10/27/12 at 1000 with Larina Bras, NP.

## 2012-08-26 ENCOUNTER — Other Ambulatory Visit: Payer: Self-pay | Admitting: *Deleted

## 2012-08-26 MED ORDER — POTASSIUM CHLORIDE ER 10 MEQ PO TBCR: 10.0000 meq | EXTENDED_RELEASE_TABLET | Freq: Every day | ORAL | Status: DC

## 2012-08-26 NOTE — Telephone Encounter (Signed)
Fax Received. Refill Completed. Erin Charles (R.M.A)   

## 2012-08-31 ENCOUNTER — Encounter: Payer: Self-pay | Admitting: Oncology

## 2012-09-25 ENCOUNTER — Encounter: Payer: Self-pay | Admitting: Cardiovascular Disease

## 2012-09-25 ENCOUNTER — Ambulatory Visit (INDEPENDENT_AMBULATORY_CARE_PROVIDER_SITE_OTHER): Payer: Federal, State, Local not specified - PPO | Admitting: Cardiovascular Disease

## 2012-09-25 VITALS — BP 130/56 | HR 69 | Ht 59.0 in | Wt 142.8 lb

## 2012-09-25 DIAGNOSIS — I10 Essential (primary) hypertension: Secondary | ICD-10-CM

## 2012-09-25 DIAGNOSIS — D0590 Unspecified type of carcinoma in situ of unspecified breast: Secondary | ICD-10-CM

## 2012-09-25 DIAGNOSIS — I350 Nonrheumatic aortic (valve) stenosis: Secondary | ICD-10-CM

## 2012-09-25 DIAGNOSIS — Z950 Presence of cardiac pacemaker: Secondary | ICD-10-CM

## 2012-09-25 DIAGNOSIS — D0512 Intraductal carcinoma in situ of left breast: Secondary | ICD-10-CM

## 2012-09-25 DIAGNOSIS — I359 Nonrheumatic aortic valve disorder, unspecified: Secondary | ICD-10-CM

## 2012-09-25 NOTE — Patient Instructions (Addendum)
Your physician wants you to follow-up in: 6 MONTHS.  You will receive a reminder letter in the mail two months in advance. If you don't receive a letter, please call our office to schedule the follow-up appointment.  Your physician recommends that you continue on your current medications as directed. Please refer to the Current Medication list given to you today.  

## 2012-09-25 NOTE — Progress Notes (Signed)
Colon Branch Date of Birth  Apr 15, 1923       [facility]    [place] [address]  [address] [place], [place]  [address]   [place], [place]  [address] [phone]     [phone]   Fax  [phone]    Fax [phone]  Problem List: 1. Hypertension 2. Aortic stenosis 3. Chest pain 4. Pre-canerous breast mass 5. Macular degeneration 6. Pacer -St. Jude dual- chamber pacemaker 08/29/10   History of Present Illness:  Pt is doing well. She denies any chest pain or shortness breath.    She was thought to have breast cancer but it turned out to be pre-cancerous and is on medical therapy.  She overall is doing well. She's not had any worsening of her chest pain or shortness breath. She's able to do all of her typical chores. She has macular degeneration so she's not driving.  September 25, 2012:  She is doing well.  She just had a birthday.  She denies any cp or severe dyspnea.  She enjoys going antique shopping.   She's not had any signs or symptoms of severe aortic stenosis. Specifically she's not had any chest pain. She has not had any weakness or dizziness since having a pacemaker placed in 2012. She denies any symptoms of congestive heart failure.  Current Outpatient Prescriptions on File Prior to Visit  Medication Sig Dispense Refill  . ALPRAZolam (XANAX) 0.25 MG tablet Take 0.25 mg by mouth every 8 (eight) hours as needed. For anxiety      . anastrozole (ARIMIDEX) 1 MG tablet Take 1 mg by mouth daily.      Marland Kitchen aspirin 325 MG tablet Take 325 mg by mouth daily.        . hydrochlorothiazide 25 MG tablet Take 25 mg by mouth daily.       . hydrocodone-acetaminophen (LORCET-HD) 5-500 MG per capsule Take 3 capsules by mouth as needed. For back pain      . IRON-VITAMIN C PO Take 1 tablet by mouth daily.       . metoprolol succinate (TOPROL-XL) 50 MG 24 hr tablet Take 1 tablet (50 mg total) by mouth daily. Take with or immediately following  a meal.  90 tablet  3  . potassium chloride (K-DUR) 10 MEQ tablet Take 1 tablet (10 mEq total) by mouth daily.  90 tablet  3  . quinapril (ACCUPRIL) 40 MG tablet Take 40 mg by mouth 2 (two) times daily.      . simvastatin (ZOCOR) 40 MG tablet Take 40 mg by mouth at bedtime.       Marland Kitchen zolpidem (AMBIEN) 10 MG tablet Take 10 mg by mouth at bedtime as needed.        No current facility-administered medications on file prior to visit.    Allergies  Allergen Reactions  . Macrodantin     rash  . Penicillins     rash  . Tolectin (Tolmetin Sodium)     rash    Past Medical History  Diagnosis Date  . HTN (hypertension)   . Dyslipidemia   . Severe aortic stenosis     s/p echo in 2010 with AVA of .77  . Complete heart block     Pacer  . RBBB (right bundle branch block)   . Advanced age   . OA (osteoarthritis)   . Aortic stenosis   . DCIS (ductal carcinoma in situ) of breast, left 11/28/2011  Past Surgical History  Procedure Laterality Date  . Insert / replace / remove pacemaker  08/30/10    DUAL CHAMBER/ST. JUDE  . Pilonidal cyst excision    . Hernia repair    . Eye surgery    . Pacemaker insertion      History  Smoking status  . Never Smoker   Smokeless tobacco  . Never Used    History  Alcohol Use No    No family history on file.  Reviw of Systems:  Reviewed in the HPI.  All other systems are negative.  Physical Exam: Blood pressure 130/56, pulse 69, height 4\' 11"  (1.499 m), weight 142 lb 12.8 oz (64.774 kg). General: Well developed, well nourished, in no acute distress.  Head: Normocephalic, atraumatic, sclera non-icteric, mucus membranes are moist,   Neck: Supple. Carotids are 2 + without bruits. No JVD.  There is radiation of her systolic murmur up into her carotids.  Lungs: Clear bilaterally to auscultation.  Heart: regular rate.  normal  S1 S2. She has 3-4/6  Crescendo- decrescendo murmur at the lateral border radiating up to the carotids.  Abdomen:  Soft, non-tender, non-distended with normal bowel sounds. No hepatomegaly. No rebound/guarding. No masses.  Msk:  Strength and tone are normal  Extremities: No clubbing or cyanosis. No edema.  Distal pedal pulses are 2+ and equal bilaterally.  Neuro: Alert and oriented X 3. Moves all extremities spontaneously.  Psych:  Responds to questions appropriately with a normal affect.  ECG: 09/25/2012: Normal sinus rhythm with atrial sensing and ventricular pacing. Assessment / Plan:

## 2012-09-25 NOTE — Assessment & Plan Note (Signed)
Erin Charles is stable.  She has severe aortic stenosis but has not had any symptoms.  We discussed TAVR.  I think that standard AVR would be too much for her.    We reviewed the symptoms of severe aortic stenosis - chest pain, shortness breath, syncope. Is not having any of these symptoms.  I'll see her again in 6 months for followup office visit.

## 2012-09-28 ENCOUNTER — Encounter: Payer: Self-pay | Admitting: Internal Medicine

## 2012-09-28 ENCOUNTER — Other Ambulatory Visit: Payer: Self-pay | Admitting: Internal Medicine

## 2012-09-28 ENCOUNTER — Ambulatory Visit (INDEPENDENT_AMBULATORY_CARE_PROVIDER_SITE_OTHER): Payer: Federal, State, Local not specified - PPO | Admitting: *Deleted

## 2012-09-28 DIAGNOSIS — Z950 Presence of cardiac pacemaker: Secondary | ICD-10-CM

## 2012-09-28 DIAGNOSIS — I442 Atrioventricular block, complete: Secondary | ICD-10-CM

## 2012-09-28 LAB — REMOTE PACEMAKER DEVICE
AL IMPEDENCE PM: 580 Ohm
ATRIAL PACING PM: 22
BATTERY VOLTAGE: 2.95 V
BRDY-0003RV: 110 {beats}/min
BRDY-0004RV: 110 {beats}/min
RV LEAD AMPLITUDE: 12 mv
RV LEAD IMPEDENCE PM: 360 Ohm

## 2012-10-06 ENCOUNTER — Telehealth: Payer: Self-pay | Admitting: *Deleted

## 2012-10-06 NOTE — Telephone Encounter (Signed)
Called and spoke with patient to reschedule her appt. Due to Dr.Khan's day off.  Confirmed appt. For 10/26/12 at 1100.

## 2012-10-13 ENCOUNTER — Encounter: Payer: Self-pay | Admitting: *Deleted

## 2012-10-16 ENCOUNTER — Other Ambulatory Visit (HOSPITAL_COMMUNITY): Payer: Self-pay | Admitting: Physician Assistant

## 2012-10-26 ENCOUNTER — Encounter: Payer: Self-pay | Admitting: Family

## 2012-10-26 ENCOUNTER — Ambulatory Visit (HOSPITAL_BASED_OUTPATIENT_CLINIC_OR_DEPARTMENT_OTHER): Payer: Federal, State, Local not specified - PPO | Admitting: Lab

## 2012-10-26 ENCOUNTER — Ambulatory Visit (HOSPITAL_BASED_OUTPATIENT_CLINIC_OR_DEPARTMENT_OTHER): Payer: Federal, State, Local not specified - PPO | Admitting: Family

## 2012-10-26 VITALS — BP 161/63 | HR 81 | Temp 97.7°F | Resp 20 | Ht 59.0 in | Wt 143.3 lb

## 2012-10-26 DIAGNOSIS — D0590 Unspecified type of carcinoma in situ of unspecified breast: Secondary | ICD-10-CM

## 2012-10-26 DIAGNOSIS — D0512 Intraductal carcinoma in situ of left breast: Secondary | ICD-10-CM

## 2012-10-26 DIAGNOSIS — N959 Unspecified menopausal and perimenopausal disorder: Secondary | ICD-10-CM

## 2012-10-26 LAB — COMPREHENSIVE METABOLIC PANEL (CC13)
ALT: 12 U/L (ref 0–55)
BUN: 22.3 mg/dL (ref 7.0–26.0)
CO2: 28 mEq/L (ref 22–29)
Calcium: 9.9 mg/dL (ref 8.4–10.4)
Chloride: 102 mEq/L (ref 98–107)
Creatinine: 1 mg/dL (ref 0.6–1.1)
Glucose: 88 mg/dl (ref 70–99)
Total Bilirubin: 0.37 mg/dL (ref 0.20–1.20)

## 2012-10-26 LAB — CBC WITH DIFFERENTIAL/PLATELET
BASO%: 0.7 % (ref 0.0–2.0)
EOS%: 4.3 % (ref 0.0–7.0)
HCT: 37.5 % (ref 34.8–46.6)
LYMPH%: 24.7 % (ref 14.0–49.7)
MCH: 28.4 pg (ref 25.1–34.0)
MCHC: 33 g/dL (ref 31.5–36.0)
MONO%: 12.3 % (ref 0.0–14.0)
NEUT%: 58 % (ref 38.4–76.8)
Platelets: 208 10*3/uL (ref 145–400)

## 2012-10-26 LAB — LACTATE DEHYDROGENASE (CC13): LDH: 240 U/L (ref 125–245)

## 2012-10-26 NOTE — Patient Instructions (Addendum)
Please contact us at [phone] if you have any questions or concerns.  Consider taking vitamin D3 1000 IUs daily for bone health.

## 2012-10-26 NOTE — Progress Notes (Signed)
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   PRIOR THERAPY: 1.  Status post left breast needle core biopsy of the upper outer quadrant on 11/20/2011 which showed high-grade DCIS with comedonecrosis and calcifications, ER 100%, PR 54%, which measured 1.6 x 1.2 x 1.5 mm.  The patient has a history of using hormone replacement therapy for over 40 years with Premarin and Provera.  2.  The patient currently sees Dr. Melburn Popper, Cardiologist for severe aortic stenosis.  According to Dr. Lourena Simmonds the patient is in need of aortic valve replacement.  The patient has declined aortic valve replacement.  Due to her severe aortic stenosis, the patient is not a surgical candidate.  The patient had a pacemaker placed in 08/2010.  3.  The patient has been on antiestrogen therapy with Anastrozole since 09/2011.  CURRENT THERAPY: Anastrozole 1 mg by mouth daily.   INTERVAL HISTORY: Dr. Welton Flakes and I saw Erin Charles today for follow up of left breast DCIS.  She is accompanied by husband Erin Charles for today's office visit. Her last office visit with Dr. Donnie Coffin was on 04/23/2012.  Since her last office visit, the patient reports that she has hot flashes, chronic back pain/arthritis and continuing macular degeneration.  For her macular degeneration, the patient states that she receives injections in her eyes every 4-7 weeks.  The patient denies any other symptomatology.  The patient is establishing herself with Dr. Milta Deiters service today.   PAST MEDICAL HISTORY: Past Medical History  Diagnosis Date  . HTN (hypertension)   . Dyslipidemia   . Severe aortic stenosis     s/p echo in 2010 with  AVA of .77  . Complete heart block     Pacer  . RBBB (right bundle Charles block)   . Advanced age   . OA (osteoarthritis)   . Aortic stenosis   . 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 Macular degeneration     PAST SURGICAL HISTORY: Past Surgical History  Procedure Laterality Date  . Insert / replace / remove pacemaker  08/30/10    DUAL CHAMBER/ST. JUDE  . Pilonidal cyst excision    . Hernia repair    . Eye surgery    . Pacemaker insertion      FAMILY HISTORY: Family History  Problem Relation Age of Onset  . Cancer Mother     Bladder cancer  . Heart failure Father   . Heart attack Sister     SOCIAL HISTORY: History  Substance Use Topics  . Smoking status: Never Smoker   . Smokeless tobacco: Never Used  . Alcohol Use: No    ALLERGIES: Allergies  Allergen Reactions  . Macrodantin     rash  . Penicillins     rash  . Tolectin (Tolmetin Sodium)     rash     MEDICATIONS:  Current Outpatient Prescriptions  Medication Sig Dispense Refill  . ALPRAZolam (XANAX) 0.25 MG tablet Take 0.25 mg by mouth every 8 (eight) hours as needed. For anxiety      . anastrozole (ARIMIDEX) 1 MG tablet Take 1 mg by mouth daily.      Marland Kitchen aspirin 325 MG tablet Take 325 mg by mouth  daily.        . hydrochlorothiazide 25 MG tablet Take 25 mg by mouth daily.       . hydrocodone-acetaminophen (LORCET-HD) 5-500 MG per capsule Take 3 capsules by mouth as needed. For back pain      . IRON-VITAMIN C PO Take 1 tablet by mouth daily.       Marland Kitchen KLOR-CON M10 10 MEQ tablet TAKE 1 TABLET EVERY DAY  30 tablet  10  . metoprolol succinate (TOPROL-XL) 50 MG 24 hr tablet Take 1 tablet (50 mg total) by mouth daily. Take with or immediately following a meal.  90 tablet  3  . quinapril (ACCUPRIL) 40 MG tablet Take 40 mg by mouth 2 (two) times daily.      . simvastatin (ZOCOR) 40 MG tablet Take 40 mg by mouth at bedtime.       Marland Kitchen zolpidem (AMBIEN) 10 MG tablet Take 10 mg by mouth at bedtime  as needed.        No current facility-administered medications for this visit.      REVIEW OF SYSTEMS: A 10 point review of systems was completed and is negative except as noted above.    PHYSICAL EXAMINATION: BP 161/63  Pulse 81  Temp(Src) 97.7 F (36.5 C) (Oral)  Resp 20  Ht 4\' 11"  (1.499 m)  Wt 143 lb 4.8 oz (65 kg)  BMI 28.93 kg/m2   General appearance: Alert, cooperative, well nourished, no apparent distress Head: Normocephalic, without obvious abnormality, atraumatic Eyes: Arcus senilis, cloudy corneas, PERRLA, EOMI Nose: Nares, septum and mucosa are normal, no drainage or sinus tenderness Neck: No adenopathy, supple, symmetrical, trachea midline, thyroid not enlarged, no tenderness Resp: Clear to auscultation bilaterally Cardio: Regular rate and rhythm, S1, S2 normal, 3/6 murmur, no click, rub or gallop, left upper chest implanted pacemaker Breasts: Right and left breasts have dense glandular tissue and are pendulous bilaterally, bilateral firm medial mammary ridge, no lymphadenopathy, no nipple inversion, no axilla fullness GI: Soft, distended, non-tender, normoactive bowel sounds, no organomegaly Extremities: Extremities normal, atraumatic, no cyanosis or edema, bilateral Heberden's and Bouchard's nodes on phalanges Lymph nodes: Cervical, supraclavicular, and axillary nodes normal Neurologic: Grossly normal    ECOG FS:  Grade 1 - Symptomatic but completely ambulatory   LAB RESULTS: Lab Results  Component Value Date   WBC 7.0 10/26/2012   NEUTROABS 4.1 10/26/2012   HGB 12.4 10/26/2012   HCT 37.5 10/26/2012   MCV 86.0 10/26/2012   PLT 208 10/26/2012      Chemistry      Component Value Date/Time   NA 138 10/26/2012 1226   NA 137 12/19/2011 1614   K 4.3 10/26/2012 1226   K 3.7 12/19/2011 1614   CL 102 10/26/2012 1226   CL 100 12/19/2011 1614   CO2 28 10/26/2012 1226   CO2 28 12/19/2011 1614   BUN 22.3 10/26/2012 1226   BUN 20 12/19/2011 1614   CREATININE 1.0  10/26/2012 1226   CREATININE 0.90 12/19/2011 1614      Component Value Date/Time   CALCIUM 9.9 10/26/2012 1226   CALCIUM 10.1 12/19/2011 1614   ALKPHOS 102 10/26/2012 1226   ALKPHOS 92 12/19/2011 1614   AST 23 10/26/2012 1226   AST 21 12/19/2011 1614   ALT 12 10/26/2012 1226   ALT 15 12/19/2011 1614   BILITOT 0.37 10/26/2012 1226   BILITOT 0.2* 12/19/2011 1614       RADIOGRAPHIC STUDIES: No results found.  ASSESSMENT: 79 y.o. woman: 1.  Status post left breast needle core biopsy of the upper outer quadrant on 11/20/2011 which showed high-grade DCIS with comedonecrosis and calcifications, ER 100%, PR 54%, which measured 1.6 x 1.2 x 1.5 mm.  The patient has a history of using hormone replacement therapy for over 40 years with Premarin and Provera.  2.  The patient currently sees Dr. Melburn Popper, Cardiologist for severe aortic stenosis.  According to Dr. Lourena Simmonds the patient is in need of aortic valve replacement.  The patient has declined aortic valve replacement.  Due to her severe aortic stenosis, the patient is not a surgical candidate.  The patient had a pacemaker placed in 08/2010.  3.  The patient has been on antiestrogen therapy with Anastrozole since 09/2011.  4. Hot flashes.  PLAN: 1.  The patient will continue antiestrogen therapy with Anastrozole 1 mg by mouth daily.  She states she does not need a refill of Anastrozole at this time.  The patient has a bilateral digital diagnostic mammogram scheduled for 11/24/2012.  The patient was encouraged to take vitamin D3 1000 IUs daily for bone health while taking Anastrozole.  2.  The patient states her hot flashes are tolerable and does not want any additional medication at this time.  3.  We plan to see the patient again in 6 months at which time we will check a CBC, CMP, LDH, and vitamin D level.  All questions were answered.  The patient was encouraged to contact us in the interim with any problems, questions or concerns.    Larina Bras, NP-C 10/26/2012, 6:25 PM

## 2012-10-27 ENCOUNTER — Other Ambulatory Visit: Payer: Federal, State, Local not specified - PPO | Admitting: Lab

## 2012-10-27 ENCOUNTER — Ambulatory Visit: Payer: Federal, State, Local not specified - PPO | Admitting: Family

## 2012-10-27 ENCOUNTER — Ambulatory Visit: Payer: Federal, State, Local not specified - PPO | Admitting: Oncology

## 2012-10-27 ENCOUNTER — Telehealth: Payer: Self-pay | Admitting: Medical Oncology

## 2012-10-27 NOTE — Telephone Encounter (Signed)
Per Larina Bras, NP informed patient that her labs are ok. Patient expressed thanks, no other questions at this time.

## 2012-11-20 ENCOUNTER — Ambulatory Visit: Payer: Federal, State, Local not specified - PPO | Admitting: Lab

## 2012-11-24 ENCOUNTER — Ambulatory Visit
Admission: RE | Admit: 2012-11-24 | Discharge: 2012-11-24 | Disposition: A | Discharge status: Home / Self Care | Payer: Federal, State, Local not specified - PPO | Source: Ambulatory Visit | Attending: Oncology | Admitting: Oncology

## 2012-11-24 DIAGNOSIS — D0510 Intraductal carcinoma in situ of unspecified breast: Secondary | ICD-10-CM

## 2012-12-10 ENCOUNTER — Other Ambulatory Visit: Payer: Self-pay | Admitting: Internal Medicine

## 2012-12-23 ENCOUNTER — Encounter: Payer: Self-pay | Admitting: Internal Medicine

## 2012-12-23 ENCOUNTER — Ambulatory Visit (INDEPENDENT_AMBULATORY_CARE_PROVIDER_SITE_OTHER): Payer: Federal, State, Local not specified - PPO | Admitting: Internal Medicine

## 2012-12-23 VITALS — BP 189/55 | HR 84 | Ht 59.0 in | Wt 142.0 lb

## 2012-12-23 DIAGNOSIS — I350 Nonrheumatic aortic (valve) stenosis: Secondary | ICD-10-CM

## 2012-12-23 DIAGNOSIS — I442 Atrioventricular block, complete: Secondary | ICD-10-CM

## 2012-12-23 DIAGNOSIS — Z950 Presence of cardiac pacemaker: Secondary | ICD-10-CM

## 2012-12-23 DIAGNOSIS — I10 Essential (primary) hypertension: Secondary | ICD-10-CM

## 2012-12-23 DIAGNOSIS — I359 Nonrheumatic aortic valve disorder, unspecified: Secondary | ICD-10-CM

## 2012-12-23 NOTE — Assessment & Plan Note (Signed)
Her St. Jude dual-chamber pacemaker is working normally except for noise on the atrial lead. We will follow this closely. If she were to develop noise on the ventricular lead, then we would consider ventricular lead revision.

## 2012-12-23 NOTE — Assessment & Plan Note (Signed)
She is currently asymptomatic. She will followup with her primary cardiologist.

## 2012-12-23 NOTE — Progress Notes (Signed)
PCP: Mickie Hillier, MD Primary Cardiologist: Nahser  Erin Charles is a 79 y.o. female who presents today for routine electrophysiology followup.  Since last being seen in our clinic, the patient reports doing very well. She is currenlty undergoing medical therapy for breast cancer.  Today, she denies symptoms of palpitations, chest pain, shortness of breath,  lower extremity edema, dizziness, presyncope, or syncope.  She is limited in her activity by her arthritis. The patient is otherwise without complaint today.   Her pacemaker today shows normal device function.  She is dependent today to a rate of 30.  She does have noise on her atrial lead. Her noise reversion algorithm appears to be working normally.  Past Medical History  Diagnosis Date  . HTN (hypertension)   . Dyslipidemia   . Severe aortic stenosis     s/p echo in 2010 with AVA of .77  . Complete heart block     Pacer  . RBBB (right bundle branch block)   . Advanced age   . OA (osteoarthritis)   . Aortic stenosis   . 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 Macular degeneration    Past Surgical History  Procedure Laterality Date  . Insert / replace / remove pacemaker  08/30/10    DUAL CHAMBER/ST. JUDE  . Pilonidal cyst excision    . Hernia repair    . Eye surgery    . Pacemaker insertion      Current Outpatient Prescriptions  Medication Sig Dispense Refill  . ALPRAZolam (XANAX) 0.25 MG tablet Take 0.25 mg by mouth every 8 (eight) hours as needed. For anxiety      . anastrozole (ARIMIDEX) 1 MG tablet Take 1 mg by mouth daily.      Marland Kitchen aspirin 325 MG tablet Take 325 mg by mouth daily.        . hydrochlorothiazide 25 MG tablet Take 25 mg by mouth daily.       . hydrocodone-acetaminophen (LORCET-HD) 5-500 MG per capsule Take 3 capsules by mouth as needed. For back pain      . IRON-VITAMIN C PO Take 1 tablet by mouth daily.       Marland Kitchen KLOR-CON M10 10 MEQ tablet TAKE 1 TABLET EVERY DAY  30 tablet  10   . metoprolol succinate (TOPROL-XL) 50 MG 24 hr tablet Take 1 tablet (50 mg total) by mouth daily. Take with or immediately following a meal.  90 tablet  3  . quinapril (ACCUPRIL) 40 MG tablet Take 40 mg by mouth 2 (two) times daily.      . simvastatin (ZOCOR) 40 MG tablet Take 40 mg by mouth at bedtime.       Marland Kitchen zolpidem (AMBIEN) 10 MG tablet Take 10 mg by mouth at bedtime as needed.          Physical Exam: Filed Vitals:   12/23/12 0919  BP: 189/55  Pulse: 84  Height: 4\' 11"  (1.499 m)  Weight: 142 lb (64.411 kg)    GEN- The patient is well appearing elderly woman, alert and oriented x 3 today.   Head- normocephalic, atraumatic Eyes-  Sclera clear, conjunctiva pink Ears- hearing intact Oropharynx- clear Lungs- Clear to ausculation bilaterally, normal work of breathing Chest- pacemaker pocket is well healed Heart- Regular rate and rhythm, no murmurs, rubs or gallops, PMI not laterally displaced GI- soft, NT, ND, + BS Extremities- no clubbing, cyanosis, or edema  Pacemaker interrogation- reviewed in detail today,  See PACEART report  Assessment and Plan:

## 2012-12-23 NOTE — Assessment & Plan Note (Signed)
Her blood pressure is elevated today. She is encouraged to maintain a low-sodium diet and exercise. Interestingly, her diastolic blood pressure is low. I will defer additional blood pressure medication changes to her primary physician.

## 2012-12-23 NOTE — Progress Notes (Deleted)
HPI  Allergies  Allergen Reactions  . Macrodantin     rash  . Penicillins     rash  . Tolectin (Tolmetin Sodium)     rash     Current Outpatient Prescriptions  Medication Sig Dispense Refill  . ALPRAZolam (XANAX) 0.25 MG tablet Take 0.25 mg by mouth every 8 (eight) hours as needed. For anxiety      . anastrozole (ARIMIDEX) 1 MG tablet Take 1 mg by mouth daily.      Marland Kitchen aspirin 325 MG tablet Take 325 mg by mouth daily.        . hydrochlorothiazide 25 MG tablet Take 25 mg by mouth daily.       . hydrocodone-acetaminophen (LORCET-HD) 5-500 MG per capsule Take 3 capsules by mouth as needed. For back pain      . IRON-VITAMIN C PO Take 1 tablet by mouth daily.       Marland Kitchen KLOR-CON M10 10 MEQ tablet TAKE 1 TABLET EVERY DAY  30 tablet  10  . metoprolol succinate (TOPROL-XL) 50 MG 24 hr tablet Take 1 tablet (50 mg total) by mouth daily. Take with or immediately following a meal.  90 tablet  3  . quinapril (ACCUPRIL) 40 MG tablet Take 40 mg by mouth 2 (two) times daily.      . simvastatin (ZOCOR) 40 MG tablet Take 40 mg by mouth at bedtime.       Marland Kitchen zolpidem (AMBIEN) 10 MG tablet Take 10 mg by mouth at bedtime as needed.        No current facility-administered medications for this visit.     Past Medical History  Diagnosis Date  . HTN (hypertension)   . Dyslipidemia   . Severe aortic stenosis     s/p echo in 2010 with AVA of .77  . Complete heart block     Pacer  . RBBB (right bundle branch block)   . Advanced age   . OA (osteoarthritis)   . Aortic stenosis   . 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 Macular degeneration     ROS:   All systems reviewed and negative except as noted in the HPI.   Past Surgical History  Procedure Laterality Date  . Insert / replace / remove pacemaker  08/30/10    DUAL CHAMBER/ST. JUDE  . Pilonidal cyst excision    . Hernia repair    . Eye surgery    . Pacemaker insertion       Family History  Problem Relation Age of  Onset  . Cancer Mother     Bladder cancer  . Heart failure Father   . Heart attack Sister      History   Social History  . Marital Status: Married    Spouse Name: N/A    Number of Children: N/A  . Years of Education: N/A   Occupational History  . Not on file.   Social History Main Topics  . Smoking status: Never Smoker   . Smokeless tobacco: Never Used  . Alcohol Use: No  . Drug Use: No  . Sexually Active: Not on file   Other Topics Concern  . Not on file   Social History Narrative  . No narrative on file     BP 189/55  Pulse 84  Ht 4\' 11"  (1.499 m)  Wt 142 lb (64.411 kg)  BMI 28.67 kg/m2  Physical Exam:  Well appearing NAD HEENT: Unremarkable Neck:  No JVD, no  thyromegally Lymphatics:  No adenopathy Back:  No CVA tenderness Lungs:  Clear HEART:  Regular rate rhythm, no murmurs, no rubs, no clicks Abd:  soft, positive bowel sounds, no organomegally, no rebound, no guarding Ext:  2 plus pulses, no edema, no cyanosis, no clubbing Skin:  No rashes no nodules Neuro:  CN II through XII intact, motor grossly intact  EKG  DEVICE  Normal device function.  See PaceArt for details.   Assess/Plan:

## 2012-12-23 NOTE — Patient Instructions (Addendum)
Remote monitoring is used to monitor your Pacemaker of ICD from home. This monitoring reduces the number of office visits required to check your device to one time per year. It allows [place] to keep an eye on the functioning of your device to ensure it is working properly. You are scheduled for a device check from home on March 22, 2013. You may send your transmission at any time that day. If you have a wireless device, the transmission will be sent automatically. After your physician reviews your transmission, you will receive a postcard with your next transmission date.  Your physician wants you to follow-up in: 1 year with Rick Duff, PA.  You will receive a reminder letter in the mail two months in advance. If you don't receive a letter, please call our office to schedule the follow-up appointment.

## 2013-01-05 ENCOUNTER — Telehealth: Payer: Self-pay | Admitting: Oncology

## 2013-01-09 LAB — PACEMAKER DEVICE OBSERVATION
AL IMPEDENCE PM: 610 Ohm
AL THRESHOLD: 0.5 V
BAMS-0003: 70 {beats}/min
DEVICE MODEL PM: 7210946
RV LEAD AMPLITUDE: 12 mv
RV LEAD IMPEDENCE PM: 390 Ohm
RV LEAD THRESHOLD: 1.125 V

## 2013-02-01 ENCOUNTER — Encounter: Payer: Self-pay | Admitting: Cardiovascular Disease

## 2013-02-02 ENCOUNTER — Encounter: Payer: Self-pay | Admitting: Cardiovascular Disease

## 2013-02-03 ENCOUNTER — Other Ambulatory Visit: Payer: Self-pay | Admitting: Physician Assistant

## 2013-02-03 DIAGNOSIS — D0592 Unspecified type of carcinoma in situ of left breast: Secondary | ICD-10-CM

## 2013-03-22 ENCOUNTER — Ambulatory Visit (INDEPENDENT_AMBULATORY_CARE_PROVIDER_SITE_OTHER): Payer: Federal, State, Local not specified - PPO | Admitting: *Deleted

## 2013-03-22 DIAGNOSIS — Z950 Presence of cardiac pacemaker: Secondary | ICD-10-CM

## 2013-03-22 DIAGNOSIS — I442 Atrioventricular block, complete: Secondary | ICD-10-CM

## 2013-03-23 ENCOUNTER — Ambulatory Visit (INDEPENDENT_AMBULATORY_CARE_PROVIDER_SITE_OTHER): Payer: Federal, State, Local not specified - PPO | Admitting: Cardiovascular Disease

## 2013-03-23 ENCOUNTER — Encounter: Payer: Self-pay | Admitting: Cardiovascular Disease

## 2013-03-23 VITALS — BP 124/70 | HR 69 | Ht 59.0 in | Wt 135.0 lb

## 2013-03-23 DIAGNOSIS — I350 Nonrheumatic aortic (valve) stenosis: Secondary | ICD-10-CM

## 2013-03-23 DIAGNOSIS — I359 Nonrheumatic aortic valve disorder, unspecified: Secondary | ICD-10-CM

## 2013-03-23 NOTE — Assessment & Plan Note (Signed)
She has severe aortic stenosis.  And think that elective hip replacement surgery is quite risky and she would be at moderate is a high risk for cardiovascular complications.  She for some reason needs to have urgent surgery and I think that her chances of getting to the surgery are fairly good but I would not want to do elective surgery currently.  At present she seems to be asymptomatic from an aortic stenosis standpoint. She's mostly limited by her arthritis. If her symptoms worsen we'll consider TAVR.  I'll see her back in 6 months.

## 2013-03-23 NOTE — Patient Instructions (Addendum)
Your physician wants you to follow-up in: 6 MONTHS.  You will receive a reminder letter in the mail two months in advance. If you don't receive a letter, please call our office to schedule the follow-up appointment.  Your physician recommends that you continue on your current medications as directed. Please refer to the Current Medication list given to you today.  

## 2013-03-23 NOTE — Progress Notes (Signed)
Colon Branch Date of Birth  [birth year]       [facility]    [place] [address]  [address] [place], [place]  [address]   [place], [place]  [address] [phone]     [phone]   Fax  [phone]    Fax [phone]  Problem List: 1. Hypertension 2. Aortic stenosis - severe , mean gradient of 42, peak gradient of 69. 3. Chest pain 4. Pre-canerous breast mass 5. Macular degeneration 6. Pacer -St. Jude dual- chamber pacemaker 08/29/10   History of Present Illness:  Pt is doing well. She denies any chest pain or shortness breath.    She was thought to have breast cancer but it turned out to be pre-cancerous and is on medical therapy.  She overall is doing well. She's not had any worsening of her chest pain or shortness breath. She's able to do all of her typical chores. She has macular degeneration so she's not driving.  September 25, 2012:  She is doing well.  She just had a birthday.  She denies any cp or severe dyspnea.  She enjoys going antique shopping.   She's not had any signs or symptoms of severe aortic stenosis. Specifically she's not had any chest pain. She has not had any weakness or dizziness since having a pacemaker placed in 2012. She denies any symptoms of congestive heart failure.  Sept.  23, 2014:  She is doing well.   She had some indigestion / reflux the other night.  She took some bromoseltzer which helped.  She feels better at this point.   Her pacer is working well.    She denies any CP, dyspnea, or syncope.  She has severe arthritis and is walking with a cane.  She has been told that she told that she needs a hip replacement.   Current Outpatient Prescriptions on File Prior to Visit  Medication Sig Dispense Refill  . ALPRAZolam (XANAX) 0.25 MG tablet Take 0.25 mg by mouth every 8 (eight) hours as needed. For anxiety      . anastrozole (ARIMIDEX) 1 MG tablet TAKE 1 TABLET BY MOUTH EVERY DAY  30 tablet  2   . aspirin 325 MG tablet Take 325 mg by mouth daily.        . hydrochlorothiazide 25 MG tablet Take 25 mg by mouth daily.       . hydrocodone-acetaminophen (LORCET-HD) 5-500 MG per capsule Take 3 capsules by mouth as needed. For back pain      . IRON-VITAMIN C PO Take 1 tablet by mouth daily.       Marland Kitchen KLOR-CON M10 10 MEQ tablet TAKE 1 TABLET EVERY DAY  30 tablet  10  . metoprolol succinate (TOPROL-XL) 50 MG 24 hr tablet Take 1 tablet (50 mg total) by mouth daily. Take with or immediately following a meal.  90 tablet  3  . quinapril (ACCUPRIL) 40 MG tablet Take 40 mg by mouth 2 (two) times daily.      . simvastatin (ZOCOR) 40 MG tablet Take 40 mg by mouth at bedtime.       Marland Kitchen zolpidem (AMBIEN) 10 MG tablet Take 10 mg by mouth at bedtime as needed.        No current facility-administered medications on file prior to visit.    Allergies  Allergen Reactions  . Macrodantin     rash  . Penicillins     rash  . Tolectin [  Tolmetin Sodium]     rash    Past Medical History  Diagnosis Date  . HTN (hypertension)   . Dyslipidemia   . Severe aortic stenosis     s/p echo in 2010 with AVA of .77  . Complete heart block     Pacer  . RBBB (right bundle branch block)   . Advanced age   . OA (osteoarthritis)   . Aortic stenosis   . 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 Macular degeneration     Past Surgical History  Procedure Laterality Date  . Insert / replace / remove pacemaker  08/30/10    DUAL CHAMBER/ST. JUDE  . Pilonidal cyst excision    . Hernia repair    . Eye surgery    . Pacemaker insertion      History  Smoking status  . Never Smoker   Smokeless tobacco  . Never Used    History  Alcohol Use No    Family History  Problem Relation Age of Onset  . Cancer Mother     Bladder cancer  . Heart failure Father   . Heart attack Sister     Reviw of Systems:  Reviewed in the HPI.  All other systems are negative.  Physical Exam: Blood pressure  124/70, pulse 69, height 4\' 11"  (1.499 m), weight 135 lb (61.236 kg). General: Well developed, well nourished, in no acute distress.  Head: Normocephalic, atraumatic, sclera non-icteric, mucus membranes are moist,   Neck: Supple. Carotids are 2 + without bruits. No JVD.  There is radiation of her systolic murmur up into her carotids.  Lungs: Clear bilaterally to auscultation.  Heart: regular rate.  normal  S1 S2. She has 3-4/6  Crescendo- decrescendo murmur at the lateral border radiating up to the carotids.  Abdomen: Soft, non-tender, non-distended with normal bowel sounds. No hepatomegaly. No rebound/guarding. No masses.  Msk:  Strength and tone are normal  Extremities: No clubbing or cyanosis. No edema.  Distal pedal pulses are 2+ and equal bilaterally.  Neuro: Alert and oriented X 3. Moves all extremities spontaneously.  Psych:  Responds to questions appropriately with a normal affect.  ECG: 09/25/2012: Normal sinus rhythm with atrial sensing and ventricular pacing. Assessment / Plan:

## 2013-03-24 LAB — REMOTE PACEMAKER DEVICE
AL AMPLITUDE: 1 mv
AL IMPEDENCE PM: 580 Ohm
BAMS-0001: 130 {beats}/min
BAMS-0003: 70 {beats}/min
BRDY-0002RV: 60 {beats}/min
RV LEAD IMPEDENCE PM: 400 Ohm
VENTRICULAR PACING PM: 99

## 2013-03-25 ENCOUNTER — Telehealth: Payer: Self-pay | Admitting: *Deleted

## 2013-03-25 MED ORDER — WARFARIN SODIUM 2.5 MG PO TABS: 2.5000 mg | ORAL_TABLET | Freq: Every day | ORAL | Status: DC

## 2013-03-25 NOTE — Telephone Encounter (Signed)
Dr. Elease Hashimoto has reviewed device transmission & pt is in atrial fib. States he would like pt to have either Kennon Rounds start pt on Coumadin or start pt on 2.5 mg coumadin daily with a coumadin clinic visit next week.  Called pt & husband asked me to call back later as pt is at the hairdressers.  Erika in coumadin clinic aware & agree with starting Coumadin 2.5mg  daily with check on Tuesday 03/30/13 Mylo Red RN

## 2013-03-25 NOTE — Telephone Encounter (Signed)
I spoke with Erin Charles & she is aware of new onset atrial fib & to start Coumadin 2.5mg  daily Precautions explained to Erin Charles & directions  Tiffany from coumadin clinic will call Erin Charles back with appointment for next Tuesday  Prescription sent in to [facility] Mylo Red RN

## 2013-03-30 ENCOUNTER — Telehealth: Payer: Self-pay | Admitting: *Deleted

## 2013-03-30 ENCOUNTER — Ambulatory Visit (INDEPENDENT_AMBULATORY_CARE_PROVIDER_SITE_OTHER): Payer: Federal, State, Local not specified - PPO | Admitting: *Deleted

## 2013-03-30 DIAGNOSIS — I4891 Unspecified atrial fibrillation: Secondary | ICD-10-CM

## 2013-03-30 MED ORDER — PHYTONADIONE 5 MG PO TABS: 5.0000 mg | ORAL_TABLET | Freq: Once | ORAL | Status: DC

## 2013-03-30 NOTE — Telephone Encounter (Signed)
Message copied by MUSE, Franchot Mimes on Tue Mar 30, 2013  1:33 PM ------      Message from: Vesta Mixer      Created: Tue Mar 30, 2013  1:03 PM      Regarding: RE: New coumadin start       Please decrease asa to 81 mg a day.            ----- Message -----         From: Raul Del, RN         Sent: 03/30/2013   8:57 AM           To: Vesta Mixer, MD, Antony Odea, RN      Subject: New coumadin start                                       Pt started on Coumadin on 03/25/13 for Afib, she is still on 325mg s ASA, do you want to continue that or decrease to 81mg s?       ------

## 2013-03-30 NOTE — Telephone Encounter (Signed)
Telephoned pt and made her aware to decrease her ASA to 81mg s.

## 2013-04-02 ENCOUNTER — Ambulatory Visit (INDEPENDENT_AMBULATORY_CARE_PROVIDER_SITE_OTHER): Payer: Federal, State, Local not specified - PPO | Admitting: Pharmacist

## 2013-04-02 DIAGNOSIS — I4891 Unspecified atrial fibrillation: Secondary | ICD-10-CM

## 2013-04-02 LAB — POCT INR: INR: 6.7

## 2013-04-07 ENCOUNTER — Ambulatory Visit (INDEPENDENT_AMBULATORY_CARE_PROVIDER_SITE_OTHER): Payer: Federal, State, Local not specified - PPO | Admitting: *Deleted

## 2013-04-07 ENCOUNTER — Encounter: Payer: Self-pay | Admitting: Internal Medicine

## 2013-04-07 DIAGNOSIS — I4891 Unspecified atrial fibrillation: Secondary | ICD-10-CM

## 2013-04-07 LAB — POCT INR: INR: 1.4

## 2013-04-07 MED ORDER — APIXABAN 2.5 MG PO TABS: 2.5000 mg | ORAL_TABLET | Freq: Two times a day (BID) | ORAL | Status: DC

## 2013-04-07 NOTE — Progress Notes (Signed)
Pt was started on Eliquis 2.5mg  2 times a day  for Atrial Fib on October 8th 2014.    Reviewed patients medication list.  Pt is not  currently on any combined P-gp and strong CYP3A4 inhibitors/inducers (ketoconazole, traconazole, ritonavir, carbamazepine, phenytoin, rifampin, St. John's wort).  Reviewed labs.  SCr 1.0(10/26/2012) Weight 59.09kg .  Dose is appropriate based on age and weight .   Hgb12.4(10/24/2012) and HCT 37.5(10/26/2012)  A full discussion of the nature of anticoagulants has been carried out.  A benefit/risk analysis has been presented to the patient, so that they understand the justification for choosing anticoagulation with Eliquis at this time.  The need for compliance is stressed.  Pt is aware to take the medication twice daily.  Side effects of potential bleeding are discussed, including unusual colored urine or stools, coughing up blood or coffee ground emesis, nose bleeds or serious fall or head trauma.  Discussed signs and symptoms of stroke. The patient should avoid any OTC items containing aspirin or ibuprofen.  Avoid alcohol consumption.   Call if any signs of abnormal bleeding.  Discussed financial obligations and pt's husband states they will be able to obtain  medication.  Next lab test test in 1 month

## 2013-04-26 ENCOUNTER — Telehealth: Payer: Self-pay | Admitting: *Deleted

## 2013-04-26 ENCOUNTER — Telehealth: Payer: Self-pay | Admitting: Cardiovascular Disease

## 2013-04-26 ENCOUNTER — Other Ambulatory Visit (HOSPITAL_BASED_OUTPATIENT_CLINIC_OR_DEPARTMENT_OTHER): Payer: Federal, State, Local not specified - PPO | Admitting: Lab

## 2013-04-26 ENCOUNTER — Encounter: Payer: Self-pay | Admitting: Oncology

## 2013-04-26 ENCOUNTER — Ambulatory Visit (HOSPITAL_BASED_OUTPATIENT_CLINIC_OR_DEPARTMENT_OTHER): Payer: Federal, State, Local not specified - PPO | Admitting: Oncology

## 2013-04-26 VITALS — BP 131/63 | HR 90 | Temp 97.5°F | Resp 20 | Ht 59.0 in | Wt 129.8 lb

## 2013-04-26 DIAGNOSIS — D0590 Unspecified type of carcinoma in situ of unspecified breast: Secondary | ICD-10-CM

## 2013-04-26 DIAGNOSIS — D0512 Intraductal carcinoma in situ of left breast: Secondary | ICD-10-CM

## 2013-04-26 LAB — COMPREHENSIVE METABOLIC PANEL (CC13)
ALT: 13 U/L (ref 0–55)
AST: 24 U/L (ref 5–34)
Alkaline Phosphatase: 88 U/L (ref 40–150)
Anion Gap: 9 mEq/L (ref 3–11)
CO2: 29 mEq/L (ref 22–29)
Creatinine: 0.9 mg/dL (ref 0.6–1.1)
Glucose: 96 mg/dl (ref 70–140)
Sodium: 136 mEq/L (ref 136–145)
Total Bilirubin: 0.53 mg/dL (ref 0.20–1.20)
Total Protein: 6.5 g/dL (ref 6.4–8.3)

## 2013-04-26 LAB — CBC WITH DIFFERENTIAL/PLATELET
BASO%: 0.8 % (ref 0.0–2.0)
Basophils Absolute: 0.1 10*3/uL (ref 0.0–0.1)
EOS%: 2.3 % (ref 0.0–7.0)
Eosinophils Absolute: 0.2 10*3/uL (ref 0.0–0.5)
HGB: 11.3 g/dL — ABNORMAL LOW (ref 11.6–15.9)
LYMPH%: 16.5 % (ref 14.0–49.7)
MCH: 28.7 pg (ref 25.1–34.0)
MCHC: 33.2 g/dL (ref 31.5–36.0)
MCV: 86.5 fL (ref 79.5–101.0)
MONO#: 0.6 10*3/uL (ref 0.1–0.9)
MONO%: 9.1 % (ref 0.0–14.0)
RBC: 3.95 10*6/uL (ref 3.70–5.45)
RDW: 15.8 % — ABNORMAL HIGH (ref 11.2–14.5)
lymph#: 1.2 10*3/uL (ref 0.9–3.3)

## 2013-04-26 LAB — LACTATE DEHYDROGENASE (CC13): LDH: 269 U/L — ABNORMAL HIGH (ref 125–245)

## 2013-04-26 LAB — VITAMIN D 25 HYDROXY (VIT D DEFICIENCY, FRACTURES): Vit D, 25-Hydroxy: 24 ng/mL — ABNORMAL LOW (ref 30–89)

## 2013-04-26 NOTE — Telephone Encounter (Signed)
appts made and printed...td 

## 2013-04-26 NOTE — Telephone Encounter (Signed)
Received request from Nurse box, documents faxed for surgical clearance. To: [employer] number: [phone] Attention:

## 2013-04-26 NOTE — Progress Notes (Signed)
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   PRIOR THERAPY: 1.  Status post left breast needle core biopsy of the upper outer quadrant on 11/20/2011 which showed high-grade DCIS with comedonecrosis and calcifications, ER 100%, PR 54%, which measured 1.6 x 1.2 x 1.5 mm.  The patient has a history of using hormone replacement therapy for over 40 years with Premarin and Provera.  2.  The patient currently sees Dr. Melburn Popper, Cardiologist for severe aortic stenosis.  According to Dr. Lourena Simmonds the patient is in need of aortic valve replacement.  The patient has declined aortic valve replacement.  Due to her severe aortic stenosis, the patient is not a surgical candidate.  The patient had a pacemaker placed in 08/2010.  3.  The patient has been on antiestrogen therapy with Anastrozole since 09/2011.  CURRENT THERAPY: Anastrozole 1 mg by mouth daily.   INTERVAL HISTORY:  Erin Charles was seen today for follow up of left breast DCIS.   Since her last office visit, the patient reports that she has hot flashes, chronic back pain/arthritis and continuing macular degeneration.  For her macular degeneration, the patient states that she receives injections in her eyes every 4-7 weeks.  The patient denies any other symptomatology.     PAST MEDICAL HISTORY: Past Medical History  Diagnosis Date  . HTN (hypertension)   . Dyslipidemia   . Severe aortic stenosis     s/p echo in 2010 with AVA of .77  . Complete heart block     Pacer  . RBBB (right bundle branch block)   . Advanced age   . OA (osteoarthritis)   . Aortic stenosis   . DCIS (ductal carcinoma in situ) of  breast, left 11/28/2011  . Macular degeneration     PAST SURGICAL HISTORY: Past Surgical History  Procedure Laterality Date  . Insert / replace / remove pacemaker  08/30/10    DUAL CHAMBER/ST. JUDE  . Pilonidal cyst excision    . Hernia repair    . Eye surgery    . Pacemaker insertion      FAMILY HISTORY: Family History  Problem Relation Age of Onset  . Cancer Mother     Bladder cancer  . Heart failure Father   . Heart attack Sister     SOCIAL HISTORY: History  Substance Use Topics  . Smoking status: Never Smoker   . Smokeless tobacco: Never Used  . Alcohol Use: No    ALLERGIES: Allergies  Allergen Reactions  . Macrodantin     rash  . Penicillins     rash  . Tolectin [Tolmetin Sodium]     rash     MEDICATIONS:  Current Outpatient Prescriptions  Medication Sig Dispense Refill  . ALPRAZolam (XANAX) 0.25 MG tablet Take 0.25 mg by mouth every 8 (eight) hours as needed. For anxiety      . anastrozole (ARIMIDEX) 1 MG tablet TAKE 1 TABLET BY MOUTH EVERY DAY  30 tablet  2  . apixaban (ELIQUIS) 2.5 MG TABS tablet Take 1 tablet (2.5 mg total) by mouth 2 (two) times daily.  60 tablet  3  . aspirin 81 MG tablet Take 81 mg by mouth daily.      Marland Kitchen  hydrochlorothiazide 25 MG tablet Take 25 mg by mouth daily.       . hydrocodone-acetaminophen (LORCET-HD) 5-500 MG per capsule Take 3 capsules by mouth as needed. For back pain      . IRON-VITAMIN C PO Take 1 tablet by mouth daily.       Marland Kitchen KLOR-CON M10 10 MEQ tablet TAKE 1 TABLET EVERY DAY  30 tablet  10  . metoprolol succinate (TOPROL-XL) 50 MG 24 hr tablet Take 1 tablet (50 mg total) by mouth daily. Take with or immediately following a meal.  90 tablet  3  . Pregabalin (LYRICA PO) Take 1 each by mouth 2 (two) times daily.      . quinapril (ACCUPRIL) 40 MG tablet Take 40 mg by mouth 2 (two) times daily.      . simvastatin (ZOCOR) 40 MG tablet Take 40 mg by mouth at bedtime.       Marland Kitchen zolpidem (AMBIEN) 10 MG tablet Take 10 mg by mouth  at bedtime as needed.       . traMADol (ULTRAM) 50 MG tablet Take 50 mg by mouth every 6 (six) hours as needed for pain. May take one every 6 to 8  Hours       No current facility-administered medications for this visit.      REVIEW OF SYSTEMS: A 10 point review of systems was completed and is negative except as noted above.    PHYSICAL EXAMINATION: BP 131/63  Pulse 90  Temp(Src) 97.5 F (36.4 C) (Oral)  Resp 20  Ht 4\' 11"  (1.499 m)  Wt 129 lb 12.8 oz (58.877 kg)  BMI 26.2 kg/m2   General appearance: Alert, cooperative, well nourished, no apparent distress Head: Normocephalic, without obvious abnormality, atraumatic Eyes: Arcus senilis, cloudy corneas, PERRLA, EOMI Nose: Nares, septum and mucosa are normal, no drainage or sinus tenderness Neck: No adenopathy, supple, symmetrical, trachea midline, thyroid not enlarged, no tenderness Resp: Clear to auscultation bilaterally Cardio: Regular rate and rhythm, S1, S2 normal, 3/6 murmur, no click, rub or gallop, left upper chest implanted pacemaker Breasts: Right and left breasts have dense glandular tissue and are pendulous bilaterally, bilateral firm medial mammary ridge, no lymphadenopathy, no nipple inversion, no axilla fullness GI: Soft, distended, non-tender, normoactive bowel sounds, no organomegaly Extremities: Extremities normal, atraumatic, no cyanosis or edema, bilateral Heberden's and Bouchard's nodes on phalanges Lymph nodes: Cervical, supraclavicular, and axillary nodes normal Neurologic: Grossly normal    ECOG FS:  Grade 1 - Symptomatic but completely ambulatory   LAB RESULTS: Lab Results  Component Value Date   WBC 7.0 04/26/2013   NEUTROABS 5.0 04/26/2013   HGB 11.3* 04/26/2013   HCT 34.2* 04/26/2013   MCV 86.5 04/26/2013   PLT 233 04/26/2013      Chemistry      Component Value Date/Time   NA 136 04/26/2013 1014   NA 137 12/19/2011 1614   K 3.6 04/26/2013 1014   K 3.7 12/19/2011 1614   CL 102 10/26/2012  1226   CL 100 12/19/2011 1614   CO2 29 04/26/2013 1014   CO2 28 12/19/2011 1614   BUN 15.7 04/26/2013 1014   BUN 20 12/19/2011 1614   CREATININE 0.9 04/26/2013 1014   CREATININE 0.90 12/19/2011 1614      Component Value Date/Time   CALCIUM 10.3 04/26/2013 1014   CALCIUM 10.1 12/19/2011 1614   ALKPHOS 88 04/26/2013 1014   ALKPHOS 92 12/19/2011 1614   AST 24 04/26/2013 1014   AST 21 12/19/2011  1614   ALT 13 04/26/2013 1014   ALT 15 12/19/2011 1614   BILITOT 0.53 04/26/2013 1014   BILITOT 0.2* 12/19/2011 1614       RADIOGRAPHIC STUDIES: No results found.  ASSESSMENT: 79 y.o. woman: 1.  Status post left breast needle core biopsy of the upper outer quadrant on 11/20/2011 which showed high-grade DCIS with comedonecrosis and calcifications, ER 100%, PR 54%, which measured 1.6 x 1.2 x 1.5 mm.  The patient has a history of using hormone replacement therapy for over 40 years with Premarin and Provera.  2.  The patient has been on antiestrogen therapy with Anastrozole since 09/2011.  3. Hot flashes tolerable  PLAN: 1.  The patient will continue antiestrogen therapy with Anastrozole 1 mg by mouth daily.   2.  The patient states her hot flashes are tolerable and does not want any additional medication at this time. 3. We will see her back in 6 months for follow up   All questions were answered.  The patient was encouraged to contact us in the interim with any problems, questions or concerns. The length of time of the face-to-face encounter was 15    minutes. More than 50% of time was spent counseling and coordination of care.   Drue Second, MD Medical/Oncology [facility] [phone] (beeper) [phone] (Office)  04/26/2013, 11:43 AM

## 2013-04-26 NOTE — Patient Instructions (Signed)
Continue anastrozole daily as you are  We will continue seeing every 6 months

## 2013-04-28 ENCOUNTER — Ambulatory Visit: Payer: Federal, State, Local not specified - PPO | Admitting: Oncology

## 2013-04-28 ENCOUNTER — Other Ambulatory Visit: Payer: Federal, State, Local not specified - PPO | Admitting: Lab

## 2013-05-04 ENCOUNTER — Ambulatory Visit (INDEPENDENT_AMBULATORY_CARE_PROVIDER_SITE_OTHER): Payer: Federal, State, Local not specified - PPO | Admitting: *Deleted

## 2013-05-04 ENCOUNTER — Encounter: Payer: Self-pay | Admitting: Physical Medicine & Rehabilitation

## 2013-05-04 DIAGNOSIS — I4891 Unspecified atrial fibrillation: Secondary | ICD-10-CM

## 2013-05-04 NOTE — Progress Notes (Signed)
Pt was started on Eliquis 2.5mg  bid  for Atrial Fib  on 04/07/2013.    Reviewed patients medication list.  Pt is not  currently on any combined P-gp and strong CYP3A4 inhibitors/inducers (ketoconazole, traconazole, ritonavir, carbamazepine, phenytoin, rifampin, St. John's wort).  Reviewed labs.  SCr  0.9 Weight 58.09kg  Dose is appropriate based on lab findings.   Hgb 11.3  and HCT 34.2 labs done 04/26/2013   A full discussion of the nature of anticoagulants has been carried out.  A benefit/risk analysis has been presented to the patient, so that they understand the justification for choosing anticoagulation with Eliquis at this time.  The need for compliance is stressed.  Pt is aware to take the medication twice daily.  Side effects of potential bleeding are discussed, including unusual colored urine or stools, coughing up blood or coffee ground emesis, nose bleeds or serious fall or head trauma.  Discussed signs and symptoms of stroke. The patient should avoid any OTC items containing aspirin or ibuprofen.  Avoid alcohol consumption.   Call if any signs of abnormal bleeding.  Discussed financial obligations and pt states she in not having any problems in obtaining Eliquis.  Next lab test test in 6 months.Appt made Pt states has had no signs or symptoms of bleeding and no signs or symptoms of stroke. She does state that she was told to hold Eliquis and Lyrica but does not know what procedure she is scheduled for. This nurse called [facility] and they state she was scheduled for spinal injection yesterday Pt states she did not go anywhere yesterday [facility] will call back with instructions  Pt instructed by this nurse to continue to hold eliquis and lyrica as directed until instructions given per [employer] called back from [facility] and states they can not do spinal injection now until next week and she will call pt and give her the instructions. This nurse  called pt and instructed her to restart Eliquis 2.5mg  tonight and then continue 2.5mg  bid and that she will receive further instructions regarding spinal injection when called by Aundra Millet and she states understanding.

## 2013-05-11 ENCOUNTER — Other Ambulatory Visit: Payer: Self-pay

## 2013-05-11 DIAGNOSIS — D0592 Unspecified type of carcinoma in situ of left breast: Secondary | ICD-10-CM

## 2013-05-11 MED ORDER — ANASTROZOLE 1 MG PO TABS: 1.0000 mg | ORAL_TABLET | Freq: Every day | ORAL | Status: DC

## 2013-06-07 ENCOUNTER — Ambulatory Visit (HOSPITAL_BASED_OUTPATIENT_CLINIC_OR_DEPARTMENT_OTHER): Payer: Federal, State, Local not specified - PPO | Admitting: Physical Medicine & Rehabilitation

## 2013-06-07 ENCOUNTER — Encounter: Payer: Self-pay | Admitting: Physical Medicine & Rehabilitation

## 2013-06-07 ENCOUNTER — Ambulatory Visit: Payer: Federal, State, Local not specified - PPO | Admitting: Physical Medicine & Rehabilitation

## 2013-06-07 ENCOUNTER — Encounter: Payer: Federal, State, Local not specified - PPO | Attending: Physical Medicine & Rehabilitation

## 2013-06-07 VITALS — BP 153/56 | HR 77 | Resp 14 | Ht 59.0 in | Wt 130.4 lb

## 2013-06-07 DIAGNOSIS — M25551 Pain in right hip: Secondary | ICD-10-CM

## 2013-06-07 DIAGNOSIS — Z853 Personal history of malignant neoplasm of breast: Secondary | ICD-10-CM | POA: Insufficient documentation

## 2013-06-07 DIAGNOSIS — M545 Low back pain, unspecified: Secondary | ICD-10-CM | POA: Insufficient documentation

## 2013-06-07 DIAGNOSIS — Z5181 Encounter for therapeutic drug level monitoring: Secondary | ICD-10-CM

## 2013-06-07 DIAGNOSIS — M549 Dorsalgia, unspecified: Secondary | ICD-10-CM

## 2013-06-07 DIAGNOSIS — M159 Polyosteoarthritis, unspecified: Secondary | ICD-10-CM | POA: Insufficient documentation

## 2013-06-07 DIAGNOSIS — I10 Essential (primary) hypertension: Secondary | ICD-10-CM | POA: Insufficient documentation

## 2013-06-07 DIAGNOSIS — Z7189 Other specified counseling: Secondary | ICD-10-CM | POA: Insufficient documentation

## 2013-06-07 DIAGNOSIS — M169 Osteoarthritis of hip, unspecified: Secondary | ICD-10-CM

## 2013-06-07 DIAGNOSIS — M179 Osteoarthritis of knee, unspecified: Secondary | ICD-10-CM

## 2013-06-07 DIAGNOSIS — E785 Hyperlipidemia, unspecified: Secondary | ICD-10-CM | POA: Insufficient documentation

## 2013-06-07 DIAGNOSIS — R279 Unspecified lack of coordination: Secondary | ICD-10-CM | POA: Insufficient documentation

## 2013-06-07 DIAGNOSIS — M25559 Pain in unspecified hip: Secondary | ICD-10-CM

## 2013-06-07 DIAGNOSIS — M1710 Unilateral primary osteoarthritis, unspecified knee: Secondary | ICD-10-CM

## 2013-06-07 DIAGNOSIS — M48061 Spinal stenosis, lumbar region without neurogenic claudication: Secondary | ICD-10-CM

## 2013-06-07 DIAGNOSIS — M7061 Trochanteric bursitis, right hip: Secondary | ICD-10-CM

## 2013-06-07 DIAGNOSIS — Z950 Presence of cardiac pacemaker: Secondary | ICD-10-CM | POA: Insufficient documentation

## 2013-06-07 DIAGNOSIS — G894 Chronic pain syndrome: Secondary | ICD-10-CM | POA: Insufficient documentation

## 2013-06-07 DIAGNOSIS — I359 Nonrheumatic aortic valve disorder, unspecified: Secondary | ICD-10-CM | POA: Insufficient documentation

## 2013-06-07 DIAGNOSIS — Z79899 Other long term (current) drug therapy: Secondary | ICD-10-CM

## 2013-06-07 DIAGNOSIS — M76899 Other specified enthesopathies of unspecified lower limb, excluding foot: Secondary | ICD-10-CM

## 2013-06-07 NOTE — Patient Instructions (Addendum)
Low Vit D levels in October of 2014 as well as April 2014, 23 and 24 respectively  Please get at least a 2 week supply of hydrocodone from orthopedic office  Assuming uterine sample looks okay, will call in Tylenol #3   Have ordered physical therapy to work on balance  Need okay from cardiologist to come off of Eliquis for 2 days

## 2013-06-07 NOTE — Progress Notes (Signed)
Subjective:    Patient ID: Erin Charles, female    DOB: [birth year], 79 y.o.   MRN: [id number]  HPI CC:  Low back and R Hip/thigh pain  10 year history of low back pain. At one point was treated with epidural steroid injections caudal approach from 2005-2010. Patient indicates that the injections were helpful however does not recall why they were stopped.  Independent at home, lives with husband but daughter in law accompanies them to visit. Not driving  Right hip injection both intra articular and bursa with ortho 03/31/2013, helped with hip pain for 1 month Left knee OA, arthrocentesis, as well as synvisc injections Also has had several sacroiliac injections without fluoroscopic guidance in 2013 and 2014. Patient indicates that they were helpful for about 1 month.  Started on Eliquis about 1.5 mo ago.  Fell ~ 63mo ago but started using cane No PT thus far   PMH:  See below, not felt to be a good candidate for elective surgery secondary to Aortic stenosis Pain Inventory Average Pain 6 Pain Right Now 5 My pain is sharp and aching  In the last 24 hours, has pain interfered with the following? General activity 5 Relation with others 0 Enjoyment of life 0 What TIME of day is your pain at its worst? evening Sleep (in general) Good  Pain is worse with: bending Pain improves with: medication Relief from Meds: 8  Mobility walk with assistance use a cane ability to climb steps?  yes do you drive?  yes transfers alone Do you have any goals in this area?  no  Function retired I need assistance with the following:  shopping Do you have any goals in this area?  no  Neuro/Psych No problems in this area  Prior Studies Any changes since last visit?  yes x-rays  Physicians involved in your care Any changes since last visit?  no   Family History  Problem Relation Age of Onset  . Cancer Mother     Bladder cancer  . Heart failure Father   . Heart attack Sister     History   Social History  . Marital Status: Married    Spouse Name: N/A    Number of Children: N/A  . Years of Education: N/A   Social History Main Topics  . Smoking status: Never Smoker   . Smokeless tobacco: Never Used  . Alcohol Use: No  . Drug Use: No  . Sexual Activity: Not Currently    Birth Control/ Protection: Post-menopausal   Other Topics Concern  . None   Social History Narrative  . None   Past Surgical History  Procedure Laterality Date  . Insert / replace / remove pacemaker  08/30/10    DUAL CHAMBER/ST. JUDE  . Pilonidal cyst excision    . Hernia repair    . Eye surgery    . Pacemaker insertion     Past Medical History  Diagnosis Date  . HTN (hypertension)   . Dyslipidemia   . Severe aortic stenosis     s/p echo in 2010 with AVA of .77  . Complete heart block     Pacer  . RBBB (right bundle branch block)   . Advanced age   . OA (osteoarthritis)   . Aortic stenosis   . 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 Macular degeneration    BP 153/56  Pulse 77  Resp 14  Ht 4\' 11"  (1.499 m)  Wt  130 lb 6.4 oz (59.149 kg)  BMI 26.32 kg/m2  SpO2 99%      Review of Systems  Musculoskeletal: Positive for back pain.  All other systems reviewed and are negative.       Objective:   Physical Exam  Nursing note and vitals reviewed. Constitutional: She is oriented to person, place, and time. She appears well-developed.  HENT:  Head: Normocephalic and atraumatic.  Eyes: Conjunctivae are normal. Pupils are equal, round, and reactive to light.  Neck: Normal range of motion.  Musculoskeletal:       Thoracic back: She exhibits deformity. She exhibits no tenderness.       Lumbar back: She exhibits decreased range of motion.  Decreased internal and external rotation of the right hip Normal range of motion left hip Pain to palpation over the medial collateral ligament and lateral collateral ligaments of both knees Tenderness to  palpation over the right trochanteric bursa  No lumbar spinal tenderness mild dextrose convex scoliosis   Neurological: She is alert and oriented to person, place, and time. She has normal strength. No sensory deficit. She exhibits normal muscle tone. Gait abnormal.  Reflex Scores:      Tricep reflexes are 2+ on the right side and 2+ on the left side.      Bicep reflexes are 2+ on the right side and 2+ on the left side.      Brachioradialis reflexes are 2+ on the right side and 2+ on the left side.      Patellar reflexes are 3+ on the right side and 3+ on the left side. Psychiatric: She has a normal mood and affect.          Assessment & Plan:  #1. Lumbar spinal stenosis. Difficult to know whether the hip pain is related to a radiculopathy or some of her other issues.  2. Osteoarthritis right hip and stage not a good surgical candidate secondary to severe aortic stenosis  3. Trochanteric bursitis right hip contributing to pain radiating along the lateral aspect of the right thigh.  4. Osteoarthritis in both knees Has responded to Synvisc injections in the past  5. Chronic pain syndrome given history of falls as well as anticoagulant need to be Jude issues with medications. Instructed patient to get in a prescription for least 2 weeks of hydrocodone from orthopedics. We'll need to check urine screen. Consider Tylenol #3  Vs Tramadol weak  opiate medication given age and comorbid conditions Has low Vit D, may be contributing to pain defer to PCP for supplementation  6. Balance disorder multifactorial secondary to orthopedic issues.T scores are normal however.  Order physical therapy  Over half of the 45 min visit was spent counseling and coordinating care.  Explaining treatment options to pt, husband and daughter in law.

## 2013-06-16 ENCOUNTER — Telehealth: Payer: Self-pay

## 2013-06-16 NOTE — Telephone Encounter (Signed)
Message copied by Judd Gaudier on Wed Jun 16, 2013  8:36 AM ------      Message from: Erick Colace      Created: Wed Jun 16, 2013  6:54 AM       Please ask pt for prescription bottle or MD that wrote for it ------

## 2013-06-16 NOTE — Telephone Encounter (Signed)
Dr Roda Shutters from [facility] gave her a tramadol rx.

## 2013-06-18 ENCOUNTER — Telehealth: Payer: Self-pay

## 2013-06-18 NOTE — Telephone Encounter (Signed)
Made Colin tramadol 50 mg 1 per os 3 times a day #90 one refill

## 2013-06-18 NOTE — Telephone Encounter (Signed)
Patient's daughter called and was wondering if Dr. Wynn Banker would start prescribing patient's medication now. Patient's first OV was on 12/8.

## 2013-06-20 ENCOUNTER — Other Ambulatory Visit: Payer: Self-pay | Admitting: Cardiovascular Disease

## 2013-06-21 ENCOUNTER — Telehealth: Payer: Self-pay | Admitting: Internal Medicine

## 2013-06-21 ENCOUNTER — Other Ambulatory Visit: Payer: Self-pay

## 2013-06-21 ENCOUNTER — Ambulatory Visit (INDEPENDENT_AMBULATORY_CARE_PROVIDER_SITE_OTHER): Payer: Federal, State, Local not specified - PPO | Admitting: *Deleted

## 2013-06-21 DIAGNOSIS — I442 Atrioventricular block, complete: Secondary | ICD-10-CM

## 2013-06-21 DIAGNOSIS — I4891 Unspecified atrial fibrillation: Secondary | ICD-10-CM

## 2013-06-21 MED ORDER — TRAMADOL HCL 50 MG PO TABS: 50.0000 mg | ORAL_TABLET | Freq: Three times a day (TID) | ORAL | Status: DC | PRN

## 2013-06-21 NOTE — Telephone Encounter (Signed)
Contacted patient to inform her that I called in Tramadol to pharmacy.

## 2013-06-21 NOTE — Telephone Encounter (Signed)
Transmission received, patient aware. 

## 2013-06-21 NOTE — Telephone Encounter (Signed)
New problem     Pt needs a call back about doing her remote transmit. please.   This is her first one.

## 2013-06-25 ENCOUNTER — Telehealth: Payer: Self-pay

## 2013-06-25 ENCOUNTER — Other Ambulatory Visit: Payer: Self-pay | Admitting: Cardiovascular Disease

## 2013-06-25 LAB — MDC_IDC_ENUM_SESS_TYPE_REMOTE
Battery Remaining Longevity: 77 mo
Battery Voltage: 2.93 V
Brady Statistic AP VP Percent: 21 %
Brady Statistic AS VP Percent: 78 %
Brady Statistic RA Percent Paced: 19 %
Brady Statistic RV Percent Paced: 99 %
Implantable Pulse Generator Model: 2210
Implantable Pulse Generator Serial Number: 7210946
Lead Channel Impedance Value: 360 Ohm
Lead Channel Impedance Value: 580 Ohm
Lead Channel Pacing Threshold Amplitude: 0.75 V
Lead Channel Pacing Threshold Amplitude: 1.5 V
Lead Channel Pacing Threshold Pulse Width: 0.5 ms
Lead Channel Setting Pacing Amplitude: 2 V
Lead Channel Setting Sensing Sensitivity: 1.5 mV

## 2013-06-25 NOTE — Telephone Encounter (Signed)
Patient says tramadol is not helping her pain.  She would like something stronger sent to [facility].  Spoke with patient and advised her to continue taking her medicaiton as directed.  Also recommended heat/ice, tylenol as tolerated.  Informed patient Dr Wynn Banker would be out of the office until Monday and she could go to urgent care if pain gets to bad.  Patient was ok with this. Please advise.

## 2013-06-27 NOTE — Telephone Encounter (Signed)
May call in Tylenol 3 #90 one po TID no RF  Schedule for hip vs knee injection

## 2013-06-28 MED ORDER — ACETAMINOPHEN-CODEINE #3 300-30 MG PO TABS: 1.0000 | ORAL_TABLET | Freq: Three times a day (TID) | ORAL | Status: DC

## 2013-06-28 NOTE — Telephone Encounter (Signed)
Tylenol #3 called in to the Pharmacy. Patient is aware.

## 2013-07-05 ENCOUNTER — Ambulatory Visit
Payer: Federal, State, Local not specified - PPO | Attending: Physical Medicine & Rehabilitation | Admitting: Physical Therapy

## 2013-07-05 DIAGNOSIS — M545 Low back pain, unspecified: Secondary | ICD-10-CM | POA: Insufficient documentation

## 2013-07-05 DIAGNOSIS — Z950 Presence of cardiac pacemaker: Secondary | ICD-10-CM | POA: Insufficient documentation

## 2013-07-05 DIAGNOSIS — R262 Difficulty in walking, not elsewhere classified: Secondary | ICD-10-CM | POA: Insufficient documentation

## 2013-07-05 DIAGNOSIS — R5381 Other malaise: Secondary | ICD-10-CM | POA: Insufficient documentation

## 2013-07-05 DIAGNOSIS — M25559 Pain in unspecified hip: Secondary | ICD-10-CM | POA: Insufficient documentation

## 2013-07-05 DIAGNOSIS — IMO0001 Reserved for inherently not codable concepts without codable children: Secondary | ICD-10-CM | POA: Insufficient documentation

## 2013-07-09 ENCOUNTER — Ambulatory Visit: Payer: Federal, State, Local not specified - PPO | Admitting: Physical Therapy

## 2013-07-13 ENCOUNTER — Ambulatory Visit: Payer: Federal, State, Local not specified - PPO | Admitting: Physical Therapy

## 2013-07-14 ENCOUNTER — Encounter: Payer: Self-pay | Admitting: *Deleted

## 2013-07-15 ENCOUNTER — Ambulatory Visit: Payer: Federal, State, Local not specified - PPO | Admitting: Physical Therapy

## 2013-07-15 ENCOUNTER — Telehealth: Payer: Self-pay | Admitting: Cardiovascular Disease

## 2013-07-15 NOTE — Telephone Encounter (Signed)
Ok to stop eliquis 2 days before back injection

## 2013-07-15 NOTE — Telephone Encounter (Signed)
Pt notified, I will forward to Dr Acie Fredrickson to advise.

## 2013-07-15 NOTE — Telephone Encounter (Signed)
New message  Need to stop eliquis 2 day prior - back injection.

## 2013-07-16 NOTE — Telephone Encounter (Signed)
Daughter was informed with advice,

## 2013-07-19 ENCOUNTER — Ambulatory Visit: Payer: Federal, State, Local not specified - PPO | Admitting: Physical Therapy

## 2013-07-22 ENCOUNTER — Ambulatory Visit: Payer: Federal, State, Local not specified - PPO | Admitting: Physical Medicine & Rehabilitation

## 2013-07-22 ENCOUNTER — Ambulatory Visit: Payer: Federal, State, Local not specified - PPO | Admitting: Physical Therapy

## 2013-07-22 ENCOUNTER — Encounter: Payer: Federal, State, Local not specified - PPO | Attending: Physical Medicine & Rehabilitation

## 2013-07-22 ENCOUNTER — Encounter: Payer: Self-pay | Admitting: Physical Medicine & Rehabilitation

## 2013-07-22 VITALS — BP 144/57 | HR 83 | Resp 14 | Ht 59.0 in | Wt 125.0 lb

## 2013-07-22 DIAGNOSIS — I359 Nonrheumatic aortic valve disorder, unspecified: Secondary | ICD-10-CM | POA: Insufficient documentation

## 2013-07-22 DIAGNOSIS — I10 Essential (primary) hypertension: Secondary | ICD-10-CM | POA: Insufficient documentation

## 2013-07-22 DIAGNOSIS — E785 Hyperlipidemia, unspecified: Secondary | ICD-10-CM | POA: Insufficient documentation

## 2013-07-22 DIAGNOSIS — M545 Low back pain, unspecified: Secondary | ICD-10-CM | POA: Insufficient documentation

## 2013-07-22 DIAGNOSIS — M48061 Spinal stenosis, lumbar region without neurogenic claudication: Secondary | ICD-10-CM

## 2013-07-22 DIAGNOSIS — M25559 Pain in unspecified hip: Secondary | ICD-10-CM | POA: Insufficient documentation

## 2013-07-22 DIAGNOSIS — M1710 Unilateral primary osteoarthritis, unspecified knee: Secondary | ICD-10-CM

## 2013-07-22 DIAGNOSIS — G894 Chronic pain syndrome: Secondary | ICD-10-CM | POA: Insufficient documentation

## 2013-07-22 DIAGNOSIS — M7061 Trochanteric bursitis, right hip: Secondary | ICD-10-CM

## 2013-07-22 DIAGNOSIS — Z950 Presence of cardiac pacemaker: Secondary | ICD-10-CM | POA: Insufficient documentation

## 2013-07-22 DIAGNOSIS — M1610 Unilateral primary osteoarthritis, unspecified hip: Secondary | ICD-10-CM

## 2013-07-22 DIAGNOSIS — M76899 Other specified enthesopathies of unspecified lower limb, excluding foot: Secondary | ICD-10-CM

## 2013-07-22 DIAGNOSIS — M159 Polyosteoarthritis, unspecified: Secondary | ICD-10-CM | POA: Insufficient documentation

## 2013-07-22 DIAGNOSIS — Z853 Personal history of malignant neoplasm of breast: Secondary | ICD-10-CM | POA: Insufficient documentation

## 2013-07-22 DIAGNOSIS — R279 Unspecified lack of coordination: Secondary | ICD-10-CM | POA: Insufficient documentation

## 2013-07-22 MED ORDER — ACETAMINOPHEN-CODEINE #3 300-30 MG PO TABS: 1.0000 | ORAL_TABLET | Freq: Three times a day (TID) | ORAL | Status: DC

## 2013-07-22 NOTE — Patient Instructions (Signed)

## 2013-07-22 NOTE — Progress Notes (Signed)
Subjective:    Patient ID: Erin Charles, female    DOB: 1922-11-17, 80 y.o.   MRN: [id number]  HPI  Patient did not come off of eliquis Currently going through physical therapy. Does not want sacroiliac injection. We discussed the different types of injections she has had in the past including epidural steroid injections, trochanteric bursa injections, intra-articular hip injections, as well as sacroiliac injections. Pain Inventory Average Pain 6 Pain Right Now 5 My pain is sharp and aching  In the last 24 hours, has pain interfered with the following? General activity 5 Relation with others 0 Enjoyment of life 0 What TIME of day is your pain at its worst? evening Sleep (in general) Good  Pain is worse with: bending Pain improves with: medication Relief from Meds: 8  Mobility walk with assistance use a cane ability to climb steps?  yes do you drive?  yes transfers alone Do you have any goals in this area?  no  Function retired I need assistance with the following:  shopping Do you have any goals in this area?  no  Neuro/Psych No problems in this area  Prior Studies Any changes since last visit?  no  Physicians involved in your care Any changes since last visit?  no   Family History  Problem Relation Age of Onset  . Cancer Mother     Bladder cancer  . Heart failure Father   . Heart attack Sister    History   Social History  . Marital Status: Married    Spouse Name: N/A    Number of Children: N/A  . Years of Education: N/A   Social History Main Topics  . Smoking status: Never Smoker   . Smokeless tobacco: Never Used  . Alcohol Use: No  . Drug Use: No  . Sexual Activity: Not Currently    Birth Control/ Protection: Post-menopausal   Other Topics Concern  . None   Social History Narrative  . None   Past Surgical History  Procedure Laterality Date  . Insert / replace / remove pacemaker  08/30/10    DUAL CHAMBER/ST. JUDE  . Pilonidal cyst  excision    . Hernia repair    . Eye surgery    . Pacemaker insertion     Past Medical History  Diagnosis Date  . HTN (hypertension)   . Dyslipidemia   . Severe aortic stenosis     s/p echo in 2010 with AVA of .77  . Complete heart block     Pacer  . RBBB (right bundle branch block)   . Advanced age   . OA (osteoarthritis)   . Aortic stenosis   . 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 Macular degeneration    BP 144/57  Pulse 83  Resp 14  Ht 4\' 11"  (1.499 m)  Wt 125 lb (56.7 kg)  BMI 25.23 kg/m2  SpO2 99%      Review of Systems  Musculoskeletal: Positive for back pain.  All other systems reviewed and are negative.       Objective:   Physical Exam  Tenderness over the left and right trochanteric bursa. More tender on the right side.  No tenderness over the PSIS No tenderness over the lumbar paraspinal muscles.      Assessment & Plan:   1. Impression right trochanteric bursitis. Has had good relief with injections and past we'll repeat today.  2. spinal stenosis, osteoarthritis of the right hip as  well as [place] address bilateral knees. Continue Tylenol #3 3 times a day as needed, no signs of adverse side effects or misuse  Trochanteric bursa injection   Indication Trochanteric bursitis. Exam has tenderness over the greater trochanter of the hip. Pain has not responded to conservative care such as exercise therapy and oral medications. Pain interferes with sleep or with mobility Informed consent was obtained after describing risks and benefits of the procedure with the patient these include bleeding bruising and infection. Patient has signed written consent form. Patient placed in a lateral decubitus position with the affected hip superior. Point of maximal pain was palpated marked and prepped with Betadine and entered with a needle to bone contact. Needle slightly withdrawn then 6mg  of betamethasone with 4 cc 1% lidocaine were injected.  Patient tolerated procedure well. Post procedure instructions given.

## 2013-07-23 ENCOUNTER — Ambulatory Visit: Payer: Federal, State, Local not specified - PPO | Admitting: Physical Therapy

## 2013-07-26 ENCOUNTER — Ambulatory Visit: Payer: Federal, State, Local not specified - PPO | Admitting: Physical Therapy

## 2013-07-30 ENCOUNTER — Ambulatory Visit: Payer: Federal, State, Local not specified - PPO | Admitting: Physical Therapy

## 2013-08-09 ENCOUNTER — Telehealth: Payer: Self-pay

## 2013-08-09 NOTE — Telephone Encounter (Signed)
Patient returned call.  She is having trouble with constipation.  Advised her to try OTC stool softener, increase H2O, movement and fiber.  She will try this and follow up from there.

## 2013-08-09 NOTE — Telephone Encounter (Signed)
Patient called requesting something for increased urination.  She says she is on so many medication and she uses the restroom a lot.  Left message for patient to call back regarding her issue.

## 2013-08-13 ENCOUNTER — Other Ambulatory Visit: Payer: Self-pay | Admitting: Cardiovascular Disease

## 2013-08-23 ENCOUNTER — Other Ambulatory Visit: Payer: Self-pay | Admitting: Physical Medicine & Rehabilitation

## 2013-08-31 ENCOUNTER — Encounter: Payer: Self-pay | Admitting: Physical Medicine & Rehabilitation

## 2013-08-31 ENCOUNTER — Ambulatory Visit (HOSPITAL_BASED_OUTPATIENT_CLINIC_OR_DEPARTMENT_OTHER): Payer: Federal, State, Local not specified - PPO | Admitting: Physical Medicine & Rehabilitation

## 2013-08-31 ENCOUNTER — Encounter: Payer: Federal, State, Local not specified - PPO | Attending: Physical Medicine & Rehabilitation

## 2013-08-31 VITALS — BP 156/75 | HR 99 | Resp 14 | Ht 59.0 in | Wt 122.0 lb

## 2013-08-31 DIAGNOSIS — M48061 Spinal stenosis, lumbar region without neurogenic claudication: Secondary | ICD-10-CM | POA: Insufficient documentation

## 2013-08-31 DIAGNOSIS — M159 Polyosteoarthritis, unspecified: Secondary | ICD-10-CM | POA: Insufficient documentation

## 2013-08-31 DIAGNOSIS — M169 Osteoarthritis of hip, unspecified: Secondary | ICD-10-CM

## 2013-08-31 DIAGNOSIS — I359 Nonrheumatic aortic valve disorder, unspecified: Secondary | ICD-10-CM | POA: Insufficient documentation

## 2013-08-31 DIAGNOSIS — R279 Unspecified lack of coordination: Secondary | ICD-10-CM | POA: Insufficient documentation

## 2013-08-31 DIAGNOSIS — IMO0002 Reserved for concepts with insufficient information to code with codable children: Secondary | ICD-10-CM

## 2013-08-31 DIAGNOSIS — M179 Osteoarthritis of knee, unspecified: Secondary | ICD-10-CM

## 2013-08-31 DIAGNOSIS — I10 Essential (primary) hypertension: Secondary | ICD-10-CM | POA: Insufficient documentation

## 2013-08-31 DIAGNOSIS — Z853 Personal history of malignant neoplasm of breast: Secondary | ICD-10-CM | POA: Insufficient documentation

## 2013-08-31 DIAGNOSIS — Z950 Presence of cardiac pacemaker: Secondary | ICD-10-CM | POA: Insufficient documentation

## 2013-08-31 DIAGNOSIS — M1610 Unilateral primary osteoarthritis, unspecified hip: Secondary | ICD-10-CM

## 2013-08-31 DIAGNOSIS — M25559 Pain in unspecified hip: Secondary | ICD-10-CM | POA: Insufficient documentation

## 2013-08-31 DIAGNOSIS — G894 Chronic pain syndrome: Secondary | ICD-10-CM | POA: Insufficient documentation

## 2013-08-31 DIAGNOSIS — M1710 Unilateral primary osteoarthritis, unspecified knee: Secondary | ICD-10-CM

## 2013-08-31 DIAGNOSIS — M545 Low back pain, unspecified: Secondary | ICD-10-CM | POA: Insufficient documentation

## 2013-08-31 DIAGNOSIS — E785 Hyperlipidemia, unspecified: Secondary | ICD-10-CM | POA: Insufficient documentation

## 2013-08-31 MED ORDER — ACETAMINOPHEN-CODEINE #3 300-30 MG PO TABS: 1.0000 | ORAL_TABLET | Freq: Three times a day (TID) | ORAL | Status: DC

## 2013-08-31 NOTE — Patient Instructions (Signed)
Laxative recommendation: Senokot S one to 2 tablets at bedtime every night  Discontinue  Ambien

## 2013-08-31 NOTE — Progress Notes (Signed)
Subjective:    Patient ID: Erin Charles, female    DOB: 1923-04-08, 80 y.o.   MRN: [id number]  HPI Patient report  falling Sunday night. She took Ambien and did not remember getting up. Her husband assisted her back to the bed. She is reported hip pain and some bruising in that area.  Pain Inventory Average Pain 6 Pain Right Now 8 My pain is sharp and aching  In the last 24 hours, has pain interfered with the following? General activity 5 Relation with others 0 Enjoyment of life 0 What TIME of day is your pain at its worst? evening Sleep (in general) Good  Pain is worse with: bending Pain improves with: medication Relief from Meds: 8  Mobility walk without assistance use a cane ability to climb steps?  yes do you drive?  yes transfers alone Do you have any goals in this area?  no  Function retired I need assistance with the following:  shopping  Neuro/Psych No problems in this area  Prior Studies Any changes since last visit?  no  Physicians involved in your care Any changes since last visit?  no   Family History  Problem Relation Age of Onset  . Cancer Mother     Bladder cancer  . Heart failure Father   . Heart attack Sister    History   Social History  . Marital Status: Married    Spouse Name: N/A    Number of Children: N/A  . Years of Education: N/A   Social History Main Topics  . Smoking status: Never Smoker   . Smokeless tobacco: Never Used  . Alcohol Use: No  . Drug Use: No  . Sexual Activity: Not Currently    Birth Control/ Protection: Post-menopausal   Other Topics Concern  . None   Social History Narrative  . None   Past Surgical History  Procedure Laterality Date  . Insert / replace / remove pacemaker  08/30/10    DUAL CHAMBER/ST. JUDE  . Pilonidal cyst excision    . Hernia repair    . Eye surgery    . Pacemaker insertion     Past Medical History  Diagnosis Date  . HTN (hypertension)   . Dyslipidemia   . Severe  aortic stenosis     s/p echo in 2010 with AVA of .77  . Complete heart block     Pacer  . RBBB (right bundle branch block)   . Advanced age   . OA (osteoarthritis)   . Aortic stenosis   . 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 Macular degeneration    BP 156/75  Pulse 99  Resp 14  Ht 4\' 11"  (1.499 m)  Wt 122 lb (55.339 kg)  BMI 24.63 kg/m2  SpO2 %  Opioid Risk Score:   Fall Risk Score: High Fall Risk (>13 points) (patient educated handout given)   Review of Systems  Musculoskeletal: Positive for arthralgias and myalgias.  All other systems reviewed and are negative.       Objective:   Physical Exam Pain to palpation along bilateral lateral thighs No evidence of bruising on the right thigh Pain with hip range of motion in bilateral hips Limited hip range of motion bilaterally Negative straight leg raising Pain with palpation along the right knee, no evidence of knee effusion no evidence of ecchymosis Pain to palpation along the left need mainly lateral aspect no evidence of knee effusion no evidence of ecchymosis /  5 strength bilateral hip flexors knee extensors ankle dorsiflexors      Assessment & Plan:  1. Lumbar spinal stenosis and chronic pain is on Tylenol #3, 3 times per day Also takes Ambien for sleep. I recommended discontinuing the Ambien secondary to amnesia associated with a fall. She is on blood thinners and at high-risk for subdural hematoma if she had head trauma

## 2013-09-02 ENCOUNTER — Telehealth: Payer: Self-pay

## 2013-09-02 NOTE — Telephone Encounter (Signed)
Patient's daughter in law called and was asking questions about patient's discharge instruction from last office visit. I did not see her on the list to be able to discuss patient's medical records so patients daughter Arbie Cookey will call back.

## 2013-09-11 ENCOUNTER — Other Ambulatory Visit: Payer: Self-pay | Admitting: Cardiovascular Disease

## 2013-09-22 ENCOUNTER — Ambulatory Visit (INDEPENDENT_AMBULATORY_CARE_PROVIDER_SITE_OTHER): Payer: Federal, State, Local not specified - PPO | Admitting: *Deleted

## 2013-09-22 DIAGNOSIS — I442 Atrioventricular block, complete: Secondary | ICD-10-CM

## 2013-09-22 DIAGNOSIS — I4891 Unspecified atrial fibrillation: Secondary | ICD-10-CM

## 2013-09-27 ENCOUNTER — Other Ambulatory Visit: Payer: Self-pay | Admitting: Cardiovascular Disease

## 2013-10-01 LAB — MDC_IDC_ENUM_SESS_TYPE_REMOTE
Battery Remaining Longevity: 73 mo
Battery Voltage: 2.93 V
Brady Statistic AP VP Percent: 17 %
Brady Statistic AS VP Percent: 82 %
Brady Statistic AS VS Percent: 1 %
Brady Statistic RA Percent Paced: 16 %
Brady Statistic RV Percent Paced: 99 %
Date Time Interrogation Session: 20150325075729
Implantable Pulse Generator Model: 2210
Lead Channel Impedance Value: 580 Ohm
Lead Channel Pacing Threshold Amplitude: 0.75 V
Lead Channel Pacing Threshold Pulse Width: 0.5 ms
Lead Channel Sensing Intrinsic Amplitude: 0.9 mV
Lead Channel Sensing Intrinsic Amplitude: 7.4 mV
MDC IDC MSMT LEADCHNL RV IMPEDANCE VALUE: 340 Ohm
MDC IDC MSMT LEADCHNL RV PACING THRESHOLD AMPLITUDE: 1.625 V
MDC IDC MSMT LEADCHNL RV PACING THRESHOLD PULSEWIDTH: 0.5 ms
MDC IDC PG SERIAL: 7210946
MDC IDC SET LEADCHNL RA PACING AMPLITUDE: 2 V
MDC IDC SET LEADCHNL RV PACING AMPLITUDE: 1.875
MDC IDC SET LEADCHNL RV PACING PULSEWIDTH: 0.5 ms
MDC IDC SET LEADCHNL RV SENSING SENSITIVITY: 1.5 mV
MDC IDC STAT BRADY AP VS PERCENT: 1 %

## 2013-10-13 ENCOUNTER — Encounter: Payer: Self-pay | Admitting: Cardiovascular Disease

## 2013-10-13 ENCOUNTER — Ambulatory Visit (INDEPENDENT_AMBULATORY_CARE_PROVIDER_SITE_OTHER): Payer: Federal, State, Local not specified - PPO | Admitting: Cardiovascular Disease

## 2013-10-13 VITALS — BP 138/56 | HR 68 | Ht 59.0 in | Wt 118.0 lb

## 2013-10-13 DIAGNOSIS — I350 Nonrheumatic aortic (valve) stenosis: Secondary | ICD-10-CM

## 2013-10-13 DIAGNOSIS — I4891 Unspecified atrial fibrillation: Secondary | ICD-10-CM

## 2013-10-13 DIAGNOSIS — I359 Nonrheumatic aortic valve disorder, unspecified: Secondary | ICD-10-CM

## 2013-10-13 NOTE — Patient Instructions (Signed)
Your physician recommends that you continue on your current medications as directed. Please refer to the Current Medication list given to you today.  Your physician wants you to follow-up in: 6 months with Dr. Nahser.  You will receive a reminder letter in the mail two months in advance. If you don't receive a letter, please call our office to schedule the follow-up appointment.  

## 2013-10-13 NOTE — Assessment & Plan Note (Signed)
She's doing very well from an aortic stent stenosis standpoint. She's not had any episodes of chest pain, shortness breath, percent B. For now, we'll continue to follow her. I would not refer her for Her at this time since she is completely asymptomatic and is now 80 years old. Continue to follow her.

## 2013-10-13 NOTE — Assessment & Plan Note (Signed)
She is maintaining NSR.  ECg shows AV pacing

## 2013-10-13 NOTE — Progress Notes (Signed)
Erin Charles Date of Birth  11/10/1922       [facility]    [employer] [address], [address], [place], [place]  [address]   [place], [place]  [address] [phone]     [phone]   Fax  [phone]    Fax [phone]  Problem List: 1. Hypertension 2. Aortic stenosis - severe , mean gradient of 42, peak gradient of 69. 3. Chest pain 4. Pre-canerous breast mass 5. Macular degeneration 6. Pacer -St. Jude dual- chamber pacemaker 08/29/10   History of Present Illness:  Pt is doing well. She denies any chest pain or shortness breath.    She was thought to have breast cancer but it turned out to be pre-cancerous and is on medical therapy.  She overall is doing well. She's not had any worsening of her chest pain or shortness breath. She's able to do all of her typical chores. She has macular degeneration so she's not driving.  September 25, 2012:  She is doing well.  She just had a birthday.  She denies any cp or severe dyspnea.  She enjoys going antique shopping.   She's not had any signs or symptoms of severe aortic stenosis. Specifically she's not had any chest pain. She has not had any weakness or dizziness since having a pacemaker placed in 2012. She denies any symptoms of congestive heart failure.  Sept.  23, 2014:  She is doing well.   She had some indigestion / reflux the other night.  She took some bromoseltzer which helped.  She feels better at this point.   Her pacer is working well.    She denies any CP, dyspnea, or syncope.  She has severe arthritis and is walking with a cane.  She has been told that she told that she needs a hip replacement.  October 13, 2013:  Erin Charles is doing well.  No dyspnea , no CP.   Current Outpatient Prescriptions on File Prior to Visit  Medication Sig Dispense Refill  . metoprolol succinate (TOPROL-XL) 50 MG 24 hr tablet TAKE 1 TABLET DAILY WITH ORIMMEDIATELY FOLLOWING A    MEAL  90 tablet   0  . acetaminophen-codeine (TYLENOL #3) 300-30 MG per tablet Take 1 tablet by mouth 3 (three) times daily.  90 tablet  1  . ALPRAZolam (XANAX) 0.25 MG tablet Take 0.25 mg by mouth every 8 (eight) hours as needed. For anxiety      . anastrozole (ARIMIDEX) 1 MG tablet Take 1 tablet (1 mg total) by mouth daily.  30 tablet  5  . aspirin 81 MG tablet Take 81 mg by mouth daily.      Marland Kitchen ELIQUIS 2.5 MG TABS tablet TAKE 1 TABLET BY MOUTH TWICE A DAY  60 tablet  1  . hydrochlorothiazide 25 MG tablet Take 25 mg by mouth daily.       Marland Kitchen IRON-VITAMIN C PO Take 1 tablet by mouth daily.       Marland Kitchen KLOR-CON M10 10 MEQ tablet       . KLOR-CON M10 10 MEQ tablet TAKE 1 TABLET DAILY  90 tablet  0  . nortriptyline (PAMELOR) 10 MG capsule Take 10 mg by mouth at bedtime.      . Pregabalin (LYRICA PO) Take 1 each by mouth 2 (two) times daily.      . quinapril (ACCUPRIL) 40 MG tablet Take 40 mg by mouth 2 (two) times daily.      Marland Kitchen  simvastatin (ZOCOR) 40 MG tablet Take 40 mg by mouth at bedtime.       . Vitamin D, Ergocalciferol, (DRISDOL) 50000 UNITS CAPS capsule        No current facility-administered medications on file prior to visit.    Allergies  Allergen Reactions  . Macrodantin     rash  . Penicillins     rash  . Tolectin [Tolmetin Sodium]     rash    Past Medical History  Diagnosis Date  . HTN (hypertension)   . Dyslipidemia   . Severe aortic stenosis     s/p echo in 2010 with AVA of .77  . Complete heart block     Pacer  . RBBB (right bundle branch block)   . Advanced age   . OA (osteoarthritis)   . Aortic stenosis   . 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 Macular degeneration     Past Surgical History  Procedure Laterality Date  . Insert / replace / remove pacemaker  08/30/10    DUAL CHAMBER/ST. JUDE  . Pilonidal cyst excision    . Hernia repair    . Eye surgery    . Pacemaker insertion      History  Smoking status  . Never Smoker   Smokeless tobacco  .  Never Used    History  Alcohol Use No    Family History  Problem Relation Age of Onset  . Cancer Mother     Bladder cancer  . Heart failure Father   . Heart attack Sister     Reviw of Systems:  Reviewed in the HPI.  All other systems are negative.  Physical Exam: Blood pressure 138/56, pulse 68, height 4\' 11"  (1.499 m), weight 118 lb (53.524 kg). General: Well developed, well nourished, in no acute distress.  Head: Normocephalic, atraumatic, sclera non-icteric, mucus membranes are moist,   Neck: Supple. Carotids are 2 + without bruits. No JVD.  There is radiation of her systolic murmur up into her carotids.  Lungs: Clear bilaterally to auscultation.  Heart: regular rate.  normal  S1 S2. She has 3-4/6  Crescendo- decrescendo murmur at the lateral border radiating up to the carotids.  Abdomen: Soft, non-tender, non-distended with normal bowel sounds. No hepatomegaly. No rebound/guarding. No masses.  Msk:  Strength and tone are normal  Extremities: No clubbing or cyanosis. No edema.  Distal pedal pulses are 2+ and equal bilaterally.  Neuro: Alert and oriented X 3. Moves all extremities spontaneously.  Psych:  Responds to questions appropriately with a normal affect.  ECG: 09/25/2012: Normal sinus rhythm with atrial sensing and ventricular pacing. Assessment / Plan:

## 2013-10-14 ENCOUNTER — Encounter: Payer: Self-pay | Admitting: *Deleted

## 2013-10-26 ENCOUNTER — Telehealth: Payer: Self-pay | Admitting: Oncology

## 2013-10-26 ENCOUNTER — Encounter: Payer: Self-pay | Admitting: Internal Medicine

## 2013-10-26 NOTE — Telephone Encounter (Signed)
kk out - pt moved to cp2 5/26. s/w pt she is aware. per pt lb/fu time change from AM to PM @ 2pm.

## 2013-10-29 ENCOUNTER — Other Ambulatory Visit: Payer: Self-pay | Admitting: Oncology

## 2013-10-29 DIAGNOSIS — D0590 Unspecified type of carcinoma in situ of unspecified breast: Secondary | ICD-10-CM

## 2013-10-31 ENCOUNTER — Other Ambulatory Visit: Payer: Self-pay | Admitting: Oncology

## 2013-11-01 ENCOUNTER — Other Ambulatory Visit: Payer: Federal, State, Local not specified - PPO

## 2013-11-01 ENCOUNTER — Ambulatory Visit: Payer: Federal, State, Local not specified - PPO | Admitting: Physical Medicine & Rehabilitation

## 2013-11-01 ENCOUNTER — Ambulatory Visit: Payer: Federal, State, Local not specified - PPO | Admitting: Oncology

## 2013-11-07 ENCOUNTER — Other Ambulatory Visit: Payer: Self-pay | Admitting: Cardiovascular Disease

## 2013-11-08 ENCOUNTER — Ambulatory Visit (INDEPENDENT_AMBULATORY_CARE_PROVIDER_SITE_OTHER): Payer: Federal, State, Local not specified - PPO | Admitting: *Deleted

## 2013-11-08 DIAGNOSIS — I4891 Unspecified atrial fibrillation: Secondary | ICD-10-CM

## 2013-11-08 LAB — CBC
HCT: 33.5 % — ABNORMAL LOW (ref 36.0–46.0)
Hemoglobin: 11.1 g/dL — ABNORMAL LOW (ref 12.0–15.0)
MCHC: 32.9 g/dL (ref 30.0–36.0)
MCV: 81.4 fl (ref 78.0–100.0)
PLATELETS: 255 10*3/uL (ref 150.0–400.0)
RBC: 4.12 Mil/uL (ref 3.87–5.11)
RDW: 18.1 % — ABNORMAL HIGH (ref 11.5–15.5)
WBC: 7.3 10*3/uL (ref 4.0–10.5)

## 2013-11-08 LAB — BASIC METABOLIC PANEL
BUN: 23 mg/dL (ref 6–23)
CHLORIDE: 102 meq/L (ref 96–112)
CO2: 27 mEq/L (ref 19–32)
Calcium: 9.8 mg/dL (ref 8.4–10.5)
Creatinine, Ser: 0.9 mg/dL (ref 0.4–1.2)
GFR: 66.91 mL/min (ref >60.00)
Glucose, Bld: 96 mg/dL (ref 70–99)
POTASSIUM: 4.2 meq/L (ref 3.5–5.1)
Sodium: 136 mEq/L (ref 135–145)

## 2013-11-08 NOTE — Progress Notes (Signed)
Pt was started on Eliquis for Afib on 04/07/2013.    Reviewed patients medication list.  Pt is not currently on any combined P-gp and strong CYP3A4 inhibitors/inducers (ketoconazole, traconazole, ritonavir, carbamazepine, phenytoin, rifampin, St. John's wort).  Reviewed labs.  SCr 0.9, Weight 53Kg.   Dose appropriate based on criteria of age and  weight . SCr 0.9 WNL for Eliquis.    Hgb and HCT are 11.1/33.5.   A full discussion of the nature of anticoagulants has been carried out.  A benefit/risk analysis has been presented to the patient, so that they understand the justification for choosing anticoagulation with Eliquis at this time.  The need for compliance is stressed.  Pt is aware to take the medication twice daily.  Side effects of potential bleeding are discussed, including unusual colored urine or stools, coughing up blood or coffee ground emesis, nose bleeds or serious fall or head trauma.  Discussed signs and symptoms of stroke. The patient should avoid any OTC items containing aspirin or ibuprofen.  Avoid alcohol consumption.   Call if any signs of abnormal bleeding.  Discussed financial obligations and resolved any difficulty in obtaining medication.  Next lab test test in 6 months.

## 2013-11-08 NOTE — Patient Instructions (Signed)

## 2013-11-09 NOTE — Progress Notes (Signed)
Please forward to Dr. Acie Fredrickson - this is his patient

## 2013-11-12 ENCOUNTER — Other Ambulatory Visit: Payer: Self-pay | Admitting: Physical Medicine & Rehabilitation

## 2013-11-12 NOTE — Telephone Encounter (Signed)
Refilled Tyl # 3 x1 month to get through to the 11/30/13 appt.

## 2013-11-23 ENCOUNTER — Ambulatory Visit (HOSPITAL_BASED_OUTPATIENT_CLINIC_OR_DEPARTMENT_OTHER): Payer: Federal, State, Local not specified - PPO | Admitting: Internal Medicine

## 2013-11-23 ENCOUNTER — Other Ambulatory Visit (HOSPITAL_BASED_OUTPATIENT_CLINIC_OR_DEPARTMENT_OTHER): Payer: Federal, State, Local not specified - PPO

## 2013-11-23 ENCOUNTER — Telehealth: Payer: Self-pay | Admitting: Adult Health

## 2013-11-23 ENCOUNTER — Other Ambulatory Visit: Payer: Self-pay | Admitting: *Deleted

## 2013-11-23 VITALS — BP 152/65 | HR 72 | Temp 97.7°F | Resp 18 | Wt 120.5 lb

## 2013-11-23 DIAGNOSIS — M1610 Unilateral primary osteoarthritis, unspecified hip: Secondary | ICD-10-CM

## 2013-11-23 DIAGNOSIS — D0512 Intraductal carcinoma in situ of left breast: Secondary | ICD-10-CM

## 2013-11-23 DIAGNOSIS — D0590 Unspecified type of carcinoma in situ of unspecified breast: Secondary | ICD-10-CM

## 2013-11-23 DIAGNOSIS — I10 Essential (primary) hypertension: Secondary | ICD-10-CM

## 2013-11-23 DIAGNOSIS — M169 Osteoarthritis of hip, unspecified: Secondary | ICD-10-CM

## 2013-11-23 LAB — CBC WITH DIFFERENTIAL/PLATELET
BASO%: 0.7 % (ref 0.0–2.0)
Basophils Absolute: 0.1 10*3/uL (ref 0.0–0.1)
EOS ABS: 0.3 10*3/uL (ref 0.0–0.5)
EOS%: 4.5 % (ref 0.0–7.0)
HEMATOCRIT: 34.1 % — AB (ref 34.8–46.6)
HGB: 10.9 g/dL — ABNORMAL LOW (ref 11.6–15.9)
LYMPH#: 1.3 10*3/uL (ref 0.9–3.3)
LYMPH%: 17.6 % (ref 14.0–49.7)
MCH: 26.4 pg (ref 25.1–34.0)
MCHC: 32 g/dL (ref 31.5–36.0)
MCV: 82.4 fL (ref 79.5–101.0)
MONO#: 0.7 10*3/uL (ref 0.1–0.9)
MONO%: 9.4 % (ref 0.0–14.0)
NEUT#: 5.1 10*3/uL (ref 1.5–6.5)
NEUT%: 67.8 % (ref 38.4–76.8)
Platelets: 215 10*3/uL (ref 145–400)
RBC: 4.14 10*6/uL (ref 3.70–5.45)
RDW: 17.5 % — ABNORMAL HIGH (ref 11.2–14.5)
WBC: 7.5 10*3/uL (ref 3.9–10.3)

## 2013-11-23 LAB — COMPREHENSIVE METABOLIC PANEL (CC13)
ALT: 11 U/L (ref 0–55)
ANION GAP: 9 meq/L (ref 3–11)
AST: 23 U/L (ref 5–34)
Albumin: 3.7 g/dL (ref 3.5–5.0)
Alkaline Phosphatase: 101 U/L (ref 40–150)
BUN: 20.9 mg/dL (ref 7.0–26.0)
CALCIUM: 9.8 mg/dL (ref 8.4–10.4)
CHLORIDE: 104 meq/L (ref 98–109)
CO2: 27 meq/L (ref 22–29)
Creatinine: 0.8 mg/dL (ref 0.6–1.1)
GLUCOSE: 115 mg/dL (ref 70–140)
Potassium: 4.8 mEq/L (ref 3.5–5.1)
SODIUM: 141 meq/L (ref 136–145)
TOTAL PROTEIN: 6 g/dL — AB (ref 6.4–8.3)
Total Bilirubin: 0.33 mg/dL (ref 0.20–1.20)

## 2013-11-23 NOTE — Telephone Encounter (Signed)
, °

## 2013-11-23 NOTE — Progress Notes (Signed)
[facility]  [phone] [phone]  OFFICE PROGRESS NOTE  PATIENT: Erin Charles   DOB: 08/03/1922  MR#: [id number]  LZJ#:[id number]   REFERRAL :Gennette Pac, MD Bobbye [place], MD   CHIEF COMPLAINT: DCIS   HISTORY OF PRESENT ILLNESS: 80 year old [place] woman with a history of left breast DCIS with comedonecrosis and calcifications diagnosed in 10/2011.  1.  Status post left breast needle core biopsy of the upper outer quadrant on 11/20/2011 which showed high-grade DCIS with comedonecrosis and calcifications, ER 100%, PR 54%, which measured 1.6 x 1.2 x 1.5 mm.    The patient has a history of using hormone replacement therapy for over 40 years with Premarin and Provera prior to DCIS diagnosis.  Patient has been on anastrozole since 09/2011. She was not a candidate for lumpectomy with her aortic stenosis history.  She has tolerated anastrzole fairly well.  She is on anticoagulation eliquis for cardiac reasons.  Denies any bleeding issues. She had a fall early this year at home after "taking 2 pills of sleeping pill instead of one." No bleed issues.   The patient has declined aortic valve replacement.  The patient had a pacemaker placed in 08/2010.  She is due for her next annual mammogram this month.  Hot flashes are minimal and bearable. She has right HIP severe DJD which requires chronic pain control with pain meds.   PAST MEDICAL HISTORY: Past Medical History  Diagnosis Date  . HTN (hypertension)   . Dyslipidemia   . Severe aortic stenosis     s/p echo in 2010 with AVA of .77  . Complete heart block     Pacer  . RBBB (right bundle branch block)   . Advanced age   . OA (osteoarthritis)   . Aortic stenosis   . 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 Macular degeneration     PAST SURGICAL HISTORY: Past Surgical History  Procedure Laterality Date  . Insert / replace / remove pacemaker   08/30/10    DUAL CHAMBER/ST. JUDE  . Pilonidal cyst excision    . Hernia repair    . Eye surgery    . Pacemaker insertion      FAMILY HISTORY: Family History  Problem Relation Age of Onset  . Cancer Mother     Bladder cancer  . Heart failure Father   . Heart attack Sister     SOCIAL HISTORY: History  Substance Use Topics  . Smoking status: Never Smoker   . Smokeless tobacco: Never Used  . Alcohol Use: No    ALLERGIES: Allergies  Allergen Reactions  . Macrodantin     rash  . Penicillins     rash  . Tolectin [Tolmetin Sodium]     rash     MEDICATIONS:  Current Outpatient Prescriptions  Medication Sig Dispense Refill  . acetaminophen (TYLENOL) 325 MG tablet Take 650 mg by mouth every 6 (six) hours as needed.      Marland Kitchen acetaminophen-codeine (TYLENOL #3) 300-30 MG per tablet TAKE 1 TABLET BY MOUTH 3 TIMES A DAY  90 tablet  0  . ALPRAZolam (XANAX) 0.25 MG tablet Take 0.25 mg by mouth every 8 (eight) hours as needed. For anxiety      . anastrozole (ARIMIDEX) 1 MG tablet TAKE 1 TABLET (1 MG TOTAL) BY MOUTH DAILY.  30 tablet  1  . aspirin 81 MG tablet Take 81 mg by mouth daily.      Marland Kitchen  ELIQUIS 2.5 MG TABS tablet TAKE 1 TABLET BY MOUTH TWICE A DAY  60 tablet  1  . hydrochlorothiazide 25 MG tablet Take 25 mg by mouth daily.       Marland Kitchen KLOR-CON M10 10 MEQ tablet       . metoprolol succinate (TOPROL-XL) 50 MG 24 hr tablet TAKE 1 TABLET DAILY WITH ORIMMEDIATELY FOLLOWING A    MEAL  90 tablet  0  . nortriptyline (PAMELOR) 10 MG capsule Take 10 mg by mouth at bedtime.      . Pregabalin (LYRICA PO) Take 1 each by mouth 2 (two) times daily.      . quinapril (ACCUPRIL) 40 MG tablet Take 40 mg by mouth 2 (two) times daily.      Marland Kitchen senna (SENOKOT) 8.6 MG TABS tablet Take 2 tablets by mouth at bedtime.      . simvastatin (ZOCOR) 40 MG tablet Take 40 mg by mouth at bedtime.       . Vitamin D, Ergocalciferol, (DRISDOL) 50000 UNITS CAPS capsule Take 50,000 Units by mouth every 7 (seven) days.        Marland Kitchen zolpidem (AMBIEN) 10 MG tablet Take 10 mg by mouth at bedtime.      Marland Kitchen IRON-VITAMIN C PO Take 1 tablet by mouth daily.        No current facility-administered medications for this visit.      REVIEW OF SYSTEMS: Negative for respiratory, negative for dermatology, negative for HEENT, negative for GI, positive for CV with AS, negative for endocrine, negative for ID, negative for genitourinary, positive for musculoskeletal.  PHYSICAL EXAMINATION: BP 152/65  Pulse 72  Temp(Src) 97.7 F (36.5 C) (Oral)  Resp 18  Wt 120 lb 8 oz (54.658 kg)   General appearance: Alert, cooperative, well nourished, no apparent distress Head: Normocephalic, without obvious abnormality, atraumatic. Mucous membranes moist Eyes: Arcus senilis. PERRLA, EOMI Nose: Nares, septum and mucosa are normal, no drainage or sinus tenderness Neck: No adenopathy, supple, symmetrical, trachea midline, thyroid not enlarged, no tenderness Resp: Clear to auscultation bilaterally. No rales or wheezing. Cardio: S1, S2 normal, 3/6  Murmur. Left upper chest implanted pacemaker Breasts: No palpable masses bilaterally. Abdomen: Soft, distended, non-tender, normoactive bowel sounds, no organomegaly Extremities: Extremities normal, atraumatic, no cyanosis or edema, Lymph nodes: Cervical, supraclavicular, and axillary nodes normal. Groin-no adenopathy Neurologic: No gross focal deficits.   LAB RESULTS: Lab Results  Component Value Date   WBC 7.5 11/23/2013   NEUTROABS 5.1 11/23/2013   HGB 10.9* 11/23/2013   HCT 34.1* 11/23/2013   MCV 82.4 11/23/2013   PLT 215 11/23/2013      Chemistry      Component Value Date/Time   NA 141 11/23/2013 1423   NA 136 11/08/2013 1354   K 4.8 11/23/2013 1423   K 4.2 11/08/2013 1354   CL 102 11/08/2013 1354   CL 102 10/26/2012 1226   CO2 27 11/23/2013 1423   CO2 27 11/08/2013 1354   BUN 20.9 11/23/2013 1423   BUN 23 11/08/2013 1354   CREATININE 0.8 11/23/2013 1423   CREATININE 0.9 11/08/2013 1354       Component Value Date/Time   CALCIUM 9.8 11/23/2013 1423   CALCIUM 9.8 11/08/2013 1354   ALKPHOS 101 11/23/2013 1423   ALKPHOS 92 12/19/2011 1614   AST 23 11/23/2013 1423   AST 21 12/19/2011 1614   ALT 11 11/23/2013 1423   ALT 15 12/19/2011 1614   BILITOT 0.33 11/23/2013 1423   BILITOT 0.2* 12/19/2011  1614       RADIOGRAPHIC STUDIES: No results found.  IMPRESSION/REPORT/PLAN: 80 y.o. woman: 1.  Status post left breast needle core biopsy of the upper outer quadrant on 11/20/2011 which showed high-grade DCIS with comedonecrosis and calcifications, ER 100%, PR 54%, which measured 1.6 x 1.2 x 1.5 mm.   The patient has a history of using hormone replacement therapy for over 40 years with Premarin and Provera.  The patient has been on antiestrogen therapy with Anastrozole since 09/2011. Hot flashes tolerable. She will continue on anastrozole.  Her new right hip pain is DJD, trauma-related from her fall earlier this year.  Unlikely related to the aromatase inhibitor.  We shall schedule her for her mammogram this month.  She is to call us one week after her mammogram for the results. Otherwise, we will see her back in 6 months for follow up  Multiple questions answered.  I spent 23 minutes with more than 50% on counselling.   Dr. Doristine Church

## 2013-11-30 ENCOUNTER — Encounter: Payer: Self-pay | Admitting: Physical Medicine & Rehabilitation

## 2013-11-30 ENCOUNTER — Encounter: Payer: Federal, State, Local not specified - PPO | Attending: Physical Medicine & Rehabilitation

## 2013-11-30 ENCOUNTER — Ambulatory Visit (HOSPITAL_BASED_OUTPATIENT_CLINIC_OR_DEPARTMENT_OTHER): Payer: Federal, State, Local not specified - PPO | Admitting: Physical Medicine & Rehabilitation

## 2013-11-30 VITALS — BP 144/55 | HR 91 | Resp 14 | Ht <= 58 in | Wt 118.0 lb

## 2013-11-30 DIAGNOSIS — Z5189 Encounter for other specified aftercare: Secondary | ICD-10-CM | POA: Insufficient documentation

## 2013-11-30 DIAGNOSIS — M7061 Trochanteric bursitis, right hip: Secondary | ICD-10-CM

## 2013-11-30 DIAGNOSIS — M169 Osteoarthritis of hip, unspecified: Secondary | ICD-10-CM

## 2013-11-30 DIAGNOSIS — M48061 Spinal stenosis, lumbar region without neurogenic claudication: Secondary | ICD-10-CM

## 2013-11-30 DIAGNOSIS — M76899 Other specified enthesopathies of unspecified lower limb, excluding foot: Secondary | ICD-10-CM | POA: Insufficient documentation

## 2013-11-30 DIAGNOSIS — M1610 Unilateral primary osteoarthritis, unspecified hip: Secondary | ICD-10-CM | POA: Insufficient documentation

## 2013-11-30 MED ORDER — ACETAMINOPHEN-CODEINE #3 300-30 MG PO TABS: ORAL_TABLET | ORAL | Status: DC

## 2013-11-30 NOTE — Patient Instructions (Signed)
May need to repeat hip injection next visit

## 2013-11-30 NOTE — Progress Notes (Signed)
Subjective:    Patient ID: Erin Charles, female    DOB: 07/13/1922, 80 y.o.   MRN: [id number]  HPI No new bowel or bladder dysfunction Right greater than left hip pain. Pain limited sleeping on the right side at night. No further falls since March. Ambulates with a cane. Pain Inventory Average Pain 8 Pain Right Now 8 My pain is aching  In the last 24 hours, has pain interfered with the following? General activity 0 Relation with others 0 Enjoyment of life 0 What TIME of day is your pain at its worst? daytime Sleep (in general) Good  Pain is worse with: bending Pain improves with: medication Relief from Meds: 9  Mobility walk with assistance use a cane ability to climb steps?  yes needs help with transfers transfers alone  Function retired  Neuro/Psych trouble walking  Prior Studies Any changes since last visit?  no  Physicians involved in your care Any changes since last visit?  no   Family History  Problem Relation Age of Onset  . Cancer Mother     Bladder cancer  . Heart failure Father   . Heart attack Sister    History   Social History  . Marital Status: Married    Spouse Name: N/A    Number of Children: N/A  . Years of Education: N/A   Social History Main Topics  . Smoking status: Never Smoker   . Smokeless tobacco: Never Used  . Alcohol Use: No  . Drug Use: No  . Sexual Activity: Not Currently    Birth Control/ Protection: Post-menopausal   Other Topics Concern  . None   Social History Narrative  . None   Past Surgical History  Procedure Laterality Date  . Insert / replace / remove pacemaker  08/30/10    DUAL CHAMBER/ST. JUDE  . Pilonidal cyst excision    . Hernia repair    . Eye surgery    . Pacemaker insertion     Past Medical History  Diagnosis Date  . HTN (hypertension)   . Dyslipidemia   . Severe aortic stenosis     s/p echo in 2010 with AVA of .77  . Complete heart block     Pacer  . RBBB (right bundle branch  block)   . Advanced age   . OA (osteoarthritis)   . Aortic stenosis   . 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 Macular degeneration    BP 144/55  Pulse 91  Resp 14  Ht 4\' 10"  (1.473 m)  Wt 118 lb (53.524 kg)  BMI 24.67 kg/m2  SpO2 96%  Opioid Risk Score:   Fall Risk Score: High Fall Risk (>13 points) (pt educated on fall risk, brochure given to pt)    Review of Systems  Gastrointestinal: Positive for constipation.  Musculoskeletal: Positive for back pain and gait problem.  All other systems reviewed and are negative.      Objective:   Physical Exam  Pain to palpation along bilateral lateral thighs  No evidence of bruising on the right thigh  Pain with hip range of motion in bilateral hips  Limited hip range of motion bilaterally  Negative straight leg raising  Pain with palpation along the right knee, no evidence of knee effusion no evidence of ecchymosis  Pain to palpation along the left need mainly lateral aspect no evidence of knee effusion no evidence of ecchymosis  4/5 strength bilateral hip flexors knee extensors ankle dorsiflexors  Ambulates forward flexed posture Unable to step up on a step stool to get up on exam table Unsteady gait, uses straight cane     Assessment & Plan:  1. Lumbar spinal stenosis and chronic pain is on Tylenol #3, 3 times per day No progressive neuro deficit, functional status stable, pt satisfied with pain relief despite reporting 8/10 pain  2.  Right hip troch bursitis will inject today, not a good candidate for NSAID secondary to Eliquis  Trochanteric bursa injection without ultrasound guidance  Indication Trochanteric bursitis. Exam has tenderness over the greater trochanter of the hip. Pain has not responded to conservative care such as exercise therapy and oral medications. Pain interferes with sleep or with mobility Informed consent was obtained after describing risks and benefits of the procedure with  the patient these include bleeding bruising and infection. Patient has signed written consent form. Patient placed in a lateral decubitus position with the affected hip superior. Point of maximal pain was palpated marked and prepped with Betadine and entered with a needle to bone contact. Needle slightly withdrawn then 6mg  of betamethasone with 4 cc 1% lidocaine were injected. Patient tolerated procedure well. Post procedure instructions given.

## 2013-12-07 ENCOUNTER — Ambulatory Visit
Admission: RE | Admit: 2013-12-07 | Discharge: 2013-12-07 | Disposition: A | Discharge status: Home / Self Care | Payer: Federal, State, Local not specified - PPO | Source: Ambulatory Visit | Attending: Internal Medicine | Admitting: Internal Medicine

## 2013-12-07 DIAGNOSIS — D0590 Unspecified type of carcinoma in situ of unspecified breast: Secondary | ICD-10-CM

## 2013-12-13 ENCOUNTER — Other Ambulatory Visit: Payer: Self-pay | Admitting: Cardiovascular Disease

## 2013-12-27 ENCOUNTER — Other Ambulatory Visit: Payer: Self-pay | Admitting: *Deleted

## 2013-12-27 DIAGNOSIS — D0592 Unspecified type of carcinoma in situ of left breast: Secondary | ICD-10-CM

## 2013-12-27 MED ORDER — ANASTROZOLE 1 MG PO TABS: ORAL_TABLET | ORAL | Status: DC

## 2014-01-10 ENCOUNTER — Other Ambulatory Visit: Payer: Self-pay | Admitting: Physical Medicine & Rehabilitation

## 2014-01-17 ENCOUNTER — Other Ambulatory Visit: Payer: Self-pay | Admitting: Cardiovascular Disease

## 2014-02-08 ENCOUNTER — Encounter: Payer: Self-pay | Admitting: *Deleted

## 2014-03-03 ENCOUNTER — Encounter: Payer: Self-pay | Admitting: Physical Medicine & Rehabilitation

## 2014-03-03 ENCOUNTER — Ambulatory Visit (HOSPITAL_BASED_OUTPATIENT_CLINIC_OR_DEPARTMENT_OTHER): Payer: Federal, State, Local not specified - PPO | Admitting: Physical Medicine & Rehabilitation

## 2014-03-03 ENCOUNTER — Encounter: Payer: Federal, State, Local not specified - PPO | Attending: Physical Medicine & Rehabilitation

## 2014-03-03 VITALS — BP 132/62 | HR 84 | Resp 16 | Ht 59.0 in | Wt 120.0 lb

## 2014-03-03 DIAGNOSIS — Z5189 Encounter for other specified aftercare: Secondary | ICD-10-CM | POA: Diagnosis present

## 2014-03-03 DIAGNOSIS — M48061 Spinal stenosis, lumbar region without neurogenic claudication: Secondary | ICD-10-CM

## 2014-03-03 DIAGNOSIS — M76899 Other specified enthesopathies of unspecified lower limb, excluding foot: Secondary | ICD-10-CM

## 2014-03-03 DIAGNOSIS — M1610 Unilateral primary osteoarthritis, unspecified hip: Secondary | ICD-10-CM | POA: Insufficient documentation

## 2014-03-03 DIAGNOSIS — M7061 Trochanteric bursitis, right hip: Secondary | ICD-10-CM

## 2014-03-03 DIAGNOSIS — M169 Osteoarthritis of hip, unspecified: Secondary | ICD-10-CM | POA: Insufficient documentation

## 2014-03-03 MED ORDER — ACETAMINOPHEN-CODEINE #3 300-30 MG PO TABS: 1.0000 | ORAL_TABLET | Freq: Three times a day (TID) | ORAL | Status: DC | PRN

## 2014-03-03 NOTE — Patient Instructions (Signed)
Joint Injection  Care After  Refer to this sheet in the next few days. These instructions provide you with information on caring for yourself after you have had a joint injection. Your caregiver also may give you more specific instructions. Your treatment has been planned according to current medical practices, but problems sometimes occur. Call your caregiver if you have any problems or questions after your procedure.  After any type of joint injection, it is not uncommon to experience:  · Soreness, swelling, or bruising around the injection site.  · Mild numbness, tingling, or weakness around the injection site caused by the numbing medicine used before or with the injection.  It also is possible to experience the following effects associated with the specific agent after injection:  · Iodine-based contrast agents:  ¨ Allergic reaction (itching, hives, widespread redness, and swelling beyond the injection site).  · Corticosteroids (These effects are rare.):  ¨ Allergic reaction.  ¨ Increased blood sugar levels (If you have diabetes and you notice that your blood sugar levels have increased, notify your caregiver).  ¨ Increased blood pressure levels.  ¨ Mood swings.  · Hyaluronic acid in the use of viscosupplementation.  ¨ Temporary heat or redness.  ¨ Temporary rash and itching.  ¨ Increased fluid accumulation in the injected joint.  These effects all should resolve within a day after your procedure.   HOME CARE INSTRUCTIONS  · Limit yourself to light activity the day of your procedure. Avoid lifting heavy objects, bending, stooping, or twisting.  · Take prescription or over-the-counter pain medication as directed by your caregiver.  · You may apply ice to your injection site to reduce pain and swelling the day of your procedure. Ice may be applied 03-04 times:  ¨ Put ice in a plastic bag.  ¨ Place a towel between your skin and the bag.  ¨ Leave the ice on for no longer than 15-20 minutes each time.  SEEK  IMMEDIATE MEDICAL CARE IF:   · Pain and swelling get worse rather than better or extend beyond the injection site.  · Numbness does not go away.  · Blood or fluid continues to leak from the injection site.  · You have chest pain.  · You have swelling of your face or tongue.  · You have trouble breathing or you become dizzy.  · You develop a fever, chills, or severe tenderness at the injection site that last longer than 1 day.  MAKE SURE YOU:  · Understand these instructions.  · Watch your condition.  · Get help right away if you are not doing well or if you get worse.  Document Released: 02/28/2011 Document Revised: 09/09/2011 Document Reviewed: 02/28/2011  ExitCare® Patient Information ©2015 [employer]. This information is not intended to replace advice given to you by your health care provider. Make sure you discuss any questions you have with your health care provider.

## 2014-03-03 NOTE — Progress Notes (Signed)
Subjective:    Patient ID: Erin Charles, female    DOB: 1923/05/13, 80 y.o.   MRN: [id number]  HPI R hip injection helped  Tylenol #3 is helpful for her chronic low back pain. She has had no medication Ms. use. No falls or adverse event such as constipation. Has been busier since her husband has been hospitalized for heart problems. Continues to ambulate with a cane. Independent with all her self-care and mobility Pain Inventory Average Pain 6 Pain Right Now 4 My pain is aching  In the last 24 hours, has pain interfered with the following? General activity 5 Relation with others 0 Enjoyment of life 0 What TIME of day is your pain at its worst? evening Sleep (in general) Good  Pain is worse with: bending Pain improves with: medication Relief from Meds: 8  Mobility walk with assistance use a cane  Function retired  Neuro/Psych trouble walking  Prior Studies Any changes since last visit?  no  Physicians involved in your care Any changes since last visit?  no   Family History  Problem Relation Age of Onset  . Cancer Mother     Bladder cancer  . Heart failure Father   . Heart attack Sister    History   Social History  . Marital Status: Married    Spouse Name: N/A    Number of Children: N/A  . Years of Education: N/A   Social History Main Topics  . Smoking status: Never Smoker   . Smokeless tobacco: Never Used  . Alcohol Use: No  . Drug Use: No  . Sexual Activity: Not Currently    Birth Control/ Protection: Post-menopausal   Other Topics Concern  . None   Social History Narrative  . None   Past Surgical History  Procedure Laterality Date  . Insert / replace / remove pacemaker  08/30/10    DUAL CHAMBER/ST. JUDE  . Pilonidal cyst excision    . Hernia repair    . Eye surgery    . Pacemaker insertion     Past Medical History  Diagnosis Date  . HTN (hypertension)   . Dyslipidemia   . Severe aortic stenosis     s/p echo in 2010 with AVA of  .77  . Complete heart block     Pacer  . RBBB (right bundle branch block)   . Advanced age   . OA (osteoarthritis)   . Aortic stenosis   . 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 Macular degeneration    BP 132/62  Pulse 84  Resp 16  Ht 4\' 11"  (1.499 m)  Wt 120 lb (54.432 kg)  BMI 24.22 kg/m2  Opioid Risk Score:   Fall Risk Score: Moderate Fall Risk (6-13 points) (pt educated, declined handout)    Review of Systems  Musculoskeletal: Positive for back pain and gait problem.  All other systems reviewed and are negative.      Objective:   Physical Exam R hip tenderness over the troch  Decreased right hip internal and external rotation. Mildly limited flexion. Left hip mildly reduced internal rotation normal external rotation normal hip flexion to range of motion testing. Lumbar spine has good forward flexion however extension is only to neurtral. Gait is forward flexed no evidence of toe drag or knee instability. There is evidence of decreased hip range of motion during ambulation       Assessment & Plan:  1. Lumbar spinal stenosis and chronic pain  is on Tylenol #3, 3 times per day  No progressive neuro deficit, functional status stable, pt satisfied with pain relief despite reporting 8/10 pain  2. Right hip troch bursitis will inject today, not a good candidate for NSAID secondary to Eliquis Indication  Trochanteric bursitis. Exam has tenderness over the greater trochanter of the hip. Pain has not responded to conservative care such as exercise therapy and oral medications. Pain interferes with sleep or with mobility  Informed consent was obtained after describing risks and benefits of the procedure with the patient these include bleeding bruising and infection. Patient has signed written consent form. Patient placed in a lateral decubitus position with the affected hip superior. Point of maximal pain was palpated marked and prepped with Betadine and  entered with a needle to bone contact. Needle slightly withdrawn then 6mg  of betamethasone with 4 cc 1% lidocaine were injected. Patient tolerated procedure well. Post procedure instructions given.

## 2014-03-08 ENCOUNTER — Encounter: Payer: Federal, State, Local not specified - PPO | Admitting: Internal Medicine

## 2014-03-26 ENCOUNTER — Other Ambulatory Visit: Payer: Self-pay | Admitting: Cardiovascular Disease

## 2014-04-23 ENCOUNTER — Other Ambulatory Visit: Payer: Self-pay | Admitting: Cardiovascular Disease

## 2014-04-26 ENCOUNTER — Ambulatory Visit (INDEPENDENT_AMBULATORY_CARE_PROVIDER_SITE_OTHER): Payer: Federal, State, Local not specified - PPO | Admitting: Internal Medicine

## 2014-04-26 ENCOUNTER — Encounter: Payer: Self-pay | Admitting: Internal Medicine

## 2014-04-26 VITALS — BP 134/68 | HR 88 | Ht <= 58 in | Wt 126.0 lb

## 2014-04-26 DIAGNOSIS — I350 Nonrheumatic aortic (valve) stenosis: Secondary | ICD-10-CM | POA: Diagnosis not present

## 2014-04-26 DIAGNOSIS — I480 Paroxysmal atrial fibrillation: Secondary | ICD-10-CM | POA: Diagnosis not present

## 2014-04-26 DIAGNOSIS — I442 Atrioventricular block, complete: Secondary | ICD-10-CM | POA: Diagnosis not present

## 2014-04-26 DIAGNOSIS — I4891 Unspecified atrial fibrillation: Secondary | ICD-10-CM

## 2014-04-26 DIAGNOSIS — Z950 Presence of cardiac pacemaker: Secondary | ICD-10-CM

## 2014-04-26 LAB — MDC_IDC_ENUM_SESS_TYPE_INCLINIC
Battery Voltage: 2.92 V
Brady Statistic RA Percent Paced: 15 %
Brady Statistic RV Percent Paced: 99.62 %
Date Time Interrogation Session: 20151027104301
Implantable Pulse Generator Model: 2210
Implantable Pulse Generator Serial Number: 7210946
Lead Channel Impedance Value: 325 Ohm
Lead Channel Pacing Threshold Amplitude: 1 V
Lead Channel Pacing Threshold Amplitude: 1 V
Lead Channel Pacing Threshold Pulse Width: 0.5 ms
Lead Channel Pacing Threshold Pulse Width: 0.5 ms
Lead Channel Pacing Threshold Pulse Width: 0.5 ms
Lead Channel Sensing Intrinsic Amplitude: 0.7 mV
MDC IDC MSMT BATTERY REMAINING LONGEVITY: 81.6 mo
MDC IDC MSMT LEADCHNL RA IMPEDANCE VALUE: 575 Ohm
MDC IDC MSMT LEADCHNL RV PACING THRESHOLD AMPLITUDE: 1.25 V
MDC IDC MSMT LEADCHNL RV SENSING INTR AMPL: 7.5 mV
MDC IDC SET LEADCHNL RA PACING AMPLITUDE: 2 V
MDC IDC SET LEADCHNL RV PACING AMPLITUDE: 1.5 V
MDC IDC SET LEADCHNL RV PACING PULSEWIDTH: 0.5 ms
MDC IDC SET LEADCHNL RV SENSING SENSITIVITY: 1.5 mV

## 2014-04-26 NOTE — Progress Notes (Signed)
PCP: Gennette Pac, MD Primary Cardiologist: Nahser  Erin Charles is a 80 y.o. female who presents today for routine electrophysiology followup.  She has a h/o aortic stenosis which is minimally if at all symptomatic. Since last being seen in our clinic, the patient reports doing very well.  Today, she denies symptoms of palpitations, chest pain, shortness of breath,  lower extremity edema, dizziness, presyncope, or syncope.  She is limited in her activity by her arthritis. The patient is otherwise without complaint today.    Past Medical History  Diagnosis Date  . HTN (hypertension)   . Dyslipidemia   . Severe aortic stenosis     s/p echo in 2010 with AVA of .77  . Complete heart block     Pacer  . RBBB (right bundle branch block)   . Advanced age   . OA (osteoarthritis)   . Aortic stenosis   . 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 Macular degeneration    Past Surgical History  Procedure Laterality Date  . Insert / replace / remove pacemaker  08/30/10    DUAL CHAMBER/ST. JUDE  . Pilonidal cyst excision    . Hernia repair    . Eye surgery    . Pacemaker insertion      Current Outpatient Prescriptions  Medication Sig Dispense Refill  . ALPRAZolam (XANAX) 0.25 MG tablet Take 0.25 mg by mouth every 8 (eight) hours as needed. For anxiety      . anastrozole (ARIMIDEX) 1 MG tablet Take 1 mg by mouth daily.      Marland Kitchen aspirin 325 MG tablet Take 325 mg by mouth daily.        . hydrochlorothiazide 25 MG tablet Take 25 mg by mouth daily.       . hydrocodone-acetaminophen (LORCET-HD) 5-500 MG per capsule Take 3 capsules by mouth as needed. For back pain      . IRON-VITAMIN C PO Take 1 tablet by mouth daily.       Marland Kitchen KLOR-CON M10 10 MEQ tablet TAKE 1 TABLET EVERY DAY  30 tablet  10  . metoprolol succinate (TOPROL-XL) 50 MG 24 hr tablet Take 1 tablet (50 mg total) by mouth daily. Take with or immediately following a meal.  90 tablet  3  . quinapril (ACCUPRIL) 40  MG tablet Take 40 mg by mouth 2 (two) times daily.      . simvastatin (ZOCOR) 40 MG tablet Take 40 mg by mouth at bedtime.       Marland Kitchen zolpidem (AMBIEN) 10 MG tablet Take 10 mg by mouth at bedtime as needed.          Physical Exam: Filed Vitals:   04/26/14 0956  BP: 134/68  Pulse: 88  Height: 4\' 10"  (1.473 m)  Weight: 126 lb (57.153 kg)    GEN- The patient is well appearing elderly woman, alert and oriented x 3 today.   Head- normocephalic, atraumatic Eyes-  Sclera clear, conjunctiva pink Ears- hearing intact Oropharynx- clear Lungs- Clear to ausculation bilaterally, normal work of breathing Chest- pacemaker pocket is well healed Heart- Regular rate and rhythm,  murmurs, rubs or gallops, PMI not laterally displaced GI- soft, NT, ND, + BS Extremities- no clubbing, cyanosis, or edema  Pacemaker interrogation- reviewed in detail today,  See PACEART report  Assessment and Plan:

## 2014-04-26 NOTE — Assessment & Plan Note (Signed)
Interrogation of her pacemaker demonstrates that she is maintaining sinus rhythm 96% of the time. She will continue her systemic anticoagulation.

## 2014-04-26 NOTE — Assessment & Plan Note (Signed)
Her [employer]. Jude dual-chamber pacemaker is working normally. She is maintaining sinus rhythm. Plan to recheck her pacemaker in several months.

## 2014-04-26 NOTE — Assessment & Plan Note (Signed)
She will follow up with Dr. Acie Fredrickson. Despite extensive aortic stenosis, she is minimally if at all symptomatic, I suspect because of a fairly sedentary lifestyle.

## 2014-04-26 NOTE — Patient Instructions (Signed)
Remote monitoring is used to monitor your Pacemaker of ICD from home. This monitoring reduces the number of office visits required to check your device to one time per year. It allows us to keep an eye on the functioning of your device to ensure it is working properly. You are scheduled for a device check from home on 07/28/13. You may send your transmission at any time that day. If you have a wireless device, the transmission will be sent automatically. After your physician reviews your transmission, you will receive a postcard with your next transmission date.  Your physician wants you to follow-up in: 1 year with Dr. Taylor. You will receive a reminder letter in the mail two months in advance. If you don't receive a letter, please call our office to schedule the follow-up appointment.  Your physician recommends that you continue on your current medications as directed. Please refer to the Current Medication list given to you today.   

## 2014-04-29 ENCOUNTER — Other Ambulatory Visit: Payer: Self-pay | Admitting: Cardiovascular Disease

## 2014-04-29 MED ORDER — METOPROLOL SUCCINATE ER 50 MG PO TB24: 50.0000 mg | ORAL_TABLET | Freq: Every day | ORAL | Status: DC

## 2014-05-17 ENCOUNTER — Other Ambulatory Visit: Payer: Self-pay | Admitting: *Deleted

## 2014-05-17 ENCOUNTER — Ambulatory Visit (INDEPENDENT_AMBULATORY_CARE_PROVIDER_SITE_OTHER): Payer: Federal, State, Local not specified - PPO

## 2014-05-17 DIAGNOSIS — D0590 Unspecified type of carcinoma in situ of unspecified breast: Secondary | ICD-10-CM

## 2014-05-17 DIAGNOSIS — Z5181 Encounter for therapeutic drug level monitoring: Secondary | ICD-10-CM

## 2014-05-17 DIAGNOSIS — I4891 Unspecified atrial fibrillation: Secondary | ICD-10-CM

## 2014-05-17 LAB — CBC
HCT: 34.2 % — ABNORMAL LOW (ref 36.0–46.0)
HEMOGLOBIN: 11.2 g/dL — AB (ref 12.0–15.0)
MCHC: 32.8 g/dL (ref 30.0–36.0)
MCV: 81.5 fl (ref 78.0–100.0)
PLATELETS: 234 10*3/uL (ref 150.0–400.0)
RBC: 4.19 Mil/uL (ref 3.87–5.11)
RDW: 15.4 % (ref 11.5–15.5)
WBC: 7.4 10*3/uL (ref 4.0–10.5)

## 2014-05-17 LAB — BASIC METABOLIC PANEL
BUN: 19 mg/dL (ref 6–23)
CALCIUM: 9.2 mg/dL (ref 8.4–10.5)
CO2: 30 meq/L (ref 19–32)
CREATININE: 0.8 mg/dL (ref 0.4–1.2)
Chloride: 99 mEq/L (ref 96–112)
GFR: 69.66 mL/min (ref >60.00)
Glucose, Bld: 114 mg/dL — ABNORMAL HIGH (ref 70–99)
Potassium: 4 mEq/L (ref 3.5–5.1)
SODIUM: 134 meq/L — AB (ref 135–145)

## 2014-05-17 NOTE — Patient Instructions (Signed)

## 2014-05-17 NOTE — Progress Notes (Signed)
Pt was started on Eliquis 2.5mg  BID for afib on 03/2013.    Reviewed patients medication list.  Pt is not currently on any combined P-gp and strong CYP3A4 inhibitors/inducers (ketoconazole, traconazole, ritonavir, carbamazepine, phenytoin, rifampin, St. John's wort).  Reviewed labs.  SCr 0.8 on 05/17/14, Weight 57.15kg, Age-80 y/o.  Dose appropriate based on specified criteria.   Hgb and HCT stable 11.2/34.2 on 05/17/14.  Called spoke with pt advised of lab results and to continue on same dosage of Eliquis 2.5mg  BID.  Pt verbalized understanding.  Made f/u appt in clinic for 6 months on 11/14/13 at 10am.

## 2014-05-18 ENCOUNTER — Ambulatory Visit (HOSPITAL_BASED_OUTPATIENT_CLINIC_OR_DEPARTMENT_OTHER): Payer: Federal, State, Local not specified - PPO | Admitting: Adult Health

## 2014-05-18 ENCOUNTER — Encounter: Payer: Self-pay | Admitting: Adult Health

## 2014-05-18 ENCOUNTER — Telehealth: Payer: Self-pay | Admitting: Cardiovascular Disease

## 2014-05-18 ENCOUNTER — Other Ambulatory Visit (HOSPITAL_BASED_OUTPATIENT_CLINIC_OR_DEPARTMENT_OTHER): Payer: Federal, State, Local not specified - PPO

## 2014-05-18 ENCOUNTER — Telehealth: Payer: Self-pay | Admitting: Adult Health

## 2014-05-18 VITALS — BP 142/62 | HR 64 | Temp 97.8°F | Resp 17 | Ht <= 58 in | Wt 123.1 lb

## 2014-05-18 DIAGNOSIS — D0590 Unspecified type of carcinoma in situ of unspecified breast: Secondary | ICD-10-CM

## 2014-05-18 DIAGNOSIS — D0592 Unspecified type of carcinoma in situ of left breast: Secondary | ICD-10-CM

## 2014-05-18 LAB — COMPREHENSIVE METABOLIC PANEL (CC13)
ALBUMIN: 3.7 g/dL (ref 3.5–5.0)
ALK PHOS: 121 U/L (ref 40–150)
ALT: 10 U/L (ref 0–55)
AST: 23 U/L (ref 5–34)
Anion Gap: 9 mEq/L (ref 3–11)
BUN: 20.4 mg/dL (ref 7.0–26.0)
CALCIUM: 9.8 mg/dL (ref 8.4–10.4)
CHLORIDE: 99 meq/L (ref 98–109)
CO2: 30 mEq/L — ABNORMAL HIGH (ref 22–29)
Creatinine: 0.9 mg/dL (ref 0.6–1.1)
GLUCOSE: 88 mg/dL (ref 70–140)
POTASSIUM: 4.5 meq/L (ref 3.5–5.1)
Sodium: 138 mEq/L (ref 136–145)
Total Bilirubin: 0.33 mg/dL (ref 0.20–1.20)
Total Protein: 6.1 g/dL — ABNORMAL LOW (ref 6.4–8.3)

## 2014-05-18 LAB — CBC WITH DIFFERENTIAL/PLATELET
BASO%: 1.3 % (ref 0.0–2.0)
Basophils Absolute: 0.1 10*3/uL (ref 0.0–0.1)
EOS ABS: 0.3 10*3/uL (ref 0.0–0.5)
EOS%: 4.3 % (ref 0.0–7.0)
HCT: 36.7 % (ref 34.8–46.6)
HEMOGLOBIN: 11.7 g/dL (ref 11.6–15.9)
LYMPH#: 1.4 10*3/uL (ref 0.9–3.3)
LYMPH%: 18.9 % (ref 14.0–49.7)
MCH: 26.3 pg (ref 25.1–34.0)
MCHC: 31.9 g/dL (ref 31.5–36.0)
MCV: 82.3 fL (ref 79.5–101.0)
MONO#: 0.9 10*3/uL (ref 0.1–0.9)
MONO%: 12.3 % (ref 0.0–14.0)
NEUT%: 63.2 % (ref 38.4–76.8)
NEUTROS ABS: 4.5 10*3/uL (ref 1.5–6.5)
Platelets: 237 10*3/uL (ref 145–400)
RBC: 4.46 10*6/uL (ref 3.70–5.45)
RDW: 15.7 % — AB (ref 11.2–14.5)
WBC: 7.2 10*3/uL (ref 3.9–10.3)

## 2014-05-18 NOTE — Telephone Encounter (Signed)
, °

## 2014-05-18 NOTE — Telephone Encounter (Signed)
New  Message    Calling for test results

## 2014-05-18 NOTE — Progress Notes (Signed)
[facility]  [phone]) [phone] [phone]) [phone]  OFFICE PROGRESS NOTE  PATIENT: Erin Charles   DOB: 08-23-1922  MR#: [id number]  YIR#:[id number]   REFERRAL :Gennette Pac, MD Bobbye Charleston, MD   CHIEF COMPLAINT: DCIS   PRIOR ONCOLOGIC HISTORY: 80 year old [place] woman with a history of left breast DCIS with comedonecrosis and calcifications diagnosed in 10/2011.  1.  Status post left breast needle core biopsy of the upper outer quadrant on 11/20/2011 which showed high-grade DCIS with comedonecrosis and calcifications, ER 100%, PR 54%, which measured 1.6 x 1.2 x 1.5 mm.    The patient has a history of using hormone replacement therapy for over 40 years with Premarin and Provera prior to DCIS diagnosis.  Patient has been on anastrozole since 09/2011. She was not a candidate for lumpectomy with her aortic stenosis history.    INTERVAL HISTORY: Erin Charles is here for follow up of her DCIS in her left breast.  She continues to take Arimidex.  She never had her breast cancer surgically removed.  Her mammogram is about the same in regards to the size.  She is tolerating the Arimidex well and other than an occasional hot flash, denies joint aches, or dryness.  She denies any new pain.  We updated her health maintenance below.     PAST MEDICAL HISTORY: Past Medical History  Diagnosis Date  . HTN (hypertension)   . Dyslipidemia   . Severe aortic stenosis     s/p echo in 2010 with AVA of .77  . Complete heart block     Pacer  . RBBB (right bundle branch block)   . Advanced age   . OA (osteoarthritis)   . Aortic stenosis   . 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 Macular degeneration     PAST SURGICAL HISTORY: Past Surgical History  Procedure Laterality Date  . Insert / replace / remove pacemaker  08/30/10    DUAL CHAMBER/ST. JUDE  . Pilonidal cyst excision    . Hernia repair    . Eye surgery    . Pacemaker  insertion      FAMILY HISTORY: Family History  Problem Relation Age of Onset  . Cancer Mother     Bladder cancer  . Heart failure Father   . Heart attack Sister     SOCIAL HISTORY: History  Substance Use Topics  . Smoking status: Never Smoker   . Smokeless tobacco: Never Used  . Alcohol Use: No    ALLERGIES: Allergies  Allergen Reactions  . Penicillins Other (See Comments)    rash  . Tolectin [Tolmetin Sodium] Other (See Comments)    rash  . Macrodantin Other (See Comments)    rash     MEDICATIONS:  Current Outpatient Prescriptions  Medication Sig Dispense Refill  . acetaminophen (TYLENOL) 325 MG tablet Take 650 mg by mouth every 6 (six) hours as needed (pain).     Marland Kitchen acetaminophen-codeine (TYLENOL #3) 300-30 MG per tablet Take 1 tablet by mouth every 8 (eight) hours as needed for moderate pain. 90 tablet 2  . ALPRAZolam (XANAX) 0.25 MG tablet Take 0.25 mg by mouth every 8 (eight) hours as needed. For anxiety    . anastrozole (ARIMIDEX) 1 MG tablet TAKE 1 TABLET (1 MG TOTAL) BY MOUTH DAILY. 30 tablet 4  . aspirin 81 MG tablet Take 81 mg by mouth daily.    Marland Kitchen ELIQUIS 2.5 MG TABS tablet TAKE 1 TABLET BY MOUTH TWICE  A DAY 60 tablet 1  . hydrochlorothiazide 25 MG tablet Take 25 mg by mouth daily.     Marland Kitchen IRON-VITAMIN C PO Take 1 tablet by mouth daily.     Marland Kitchen KLOR-CON M10 10 MEQ tablet TAKE 1 TABLET DAILY 30 tablet 0  . metoprolol succinate (TOPROL-XL) 50 MG 24 hr tablet Take 1 tablet (50 mg total) by mouth daily. 90 tablet 3  . nortriptyline (PAMELOR) 10 MG capsule Take 10 mg by mouth at bedtime.    . Pregabalin (LYRICA PO) Take 1 tablet by mouth 2 (two) times daily.     . quinapril (ACCUPRIL) 40 MG tablet Take 40 mg by mouth 2 (two) times daily.    Marland Kitchen senna (SENOKOT) 8.6 MG TABS tablet Take 2 tablets by mouth at bedtime.    . simvastatin (ZOCOR) 40 MG tablet Take 40 mg by mouth at bedtime.     . Vitamin D, Ergocalciferol, (DRISDOL) 50000 UNITS CAPS capsule Take 50,000 Units  by mouth every 7 (seven) days.     Marland Kitchen zolpidem (AMBIEN) 10 MG tablet Take 10 mg by mouth at bedtime.     No current facility-administered medications for this visit.      REVIEW OF SYSTEMS: A 10 point review of systems was conducted and is otherwise negative except for what is noted above.    Health Maintenance  Mammogram: 12/07/2013 Bone Density Scan: 12/2011 normal Eye Exam: 05/2014 repeat    PHYSICAL EXAMINATION: BP 142/62 mmHg  Pulse 64  Temp(Src) 97.8 F (36.6 C) (Oral)  Resp 17  Ht 4\' 10"  (1.473 m)  Wt 123 lb 1 oz (55.821 kg)  BMI 25.73 kg/m2  GENERAL: Patient is a well appearing female in no acute distress HEENT:  Sclerae anicteric.  Oropharynx clear and moist. No ulcerations or evidence of oropharyngeal candidiasis. Neck is supple.  NODES:  No cervical, supraclavicular, or axillary lymphadenopathy palpated.  BREAST EXAM: unable to palpate left breast mass, no nodules, masses, or skin change noted bilaterally LUNGS:  Clear to auscultation bilaterally.  No wheezes or rhonchi. HEART:  + systolic murmur, regular rate, rhythm ABDOMEN:  Soft, nontender.  Positive, normoactive bowel sounds. No organomegaly palpated. MSK:  No focal spinal tenderness to palpation. Full range of motion bilaterally in the upper extremities. EXTREMITIES:  No peripheral edema.   SKIN:  Clear with no obvious rashes or skin changes. No nail dyscrasia. NEURO:  Nonfocal. Well oriented.  Appropriate affect. ECOG: 1   LAB RESULTS: Lab Results  Component Value Date   WBC 7.2 05/18/2014   NEUTROABS 4.5 05/18/2014   HGB 11.7 05/18/2014   HCT 36.7 05/18/2014   MCV 82.3 05/18/2014   PLT 237 05/18/2014      Chemistry      Component Value Date/Time   NA 134* 05/17/2014 1516   NA 141 11/23/2013 1423   K 4.0 05/17/2014 1516   K 4.8 11/23/2013 1423   CL 99 05/17/2014 1516   CL 102 10/26/2012 1226   CO2 30 05/17/2014 1516   CO2 27 11/23/2013 1423   BUN 19 05/17/2014 1516   BUN 20.9 11/23/2013  1423   CREATININE 0.8 05/17/2014 1516   CREATININE 0.8 11/23/2013 1423      Component Value Date/Time   CALCIUM 9.2 05/17/2014 1516   CALCIUM 9.8 11/23/2013 1423   ALKPHOS 101 11/23/2013 1423   ALKPHOS 92 12/19/2011 1614   AST 23 11/23/2013 1423   AST 21 12/19/2011 1614   ALT 11 11/23/2013 1423  ALT 15 12/19/2011 1614   BILITOT 0.33 11/23/2013 1423   BILITOT 0.2* 12/19/2011 1614       RADIOGRAPHIC STUDIES: No results found.  ASSESSMENT: 80 y.o. woman: 1.  Status post left breast needle core biopsy of the upper outer quadrant on 11/20/2011 which showed high-grade DCIS with comedonecrosis and calcifications, ER 100%, PR 54%, which measured 1.6 x 1.2 x 1.5 mm.   Patient was not candidate for surgery and has been followed with annual mammograms while taking Anastrazole daily.    PLAN:   Erin Charles is doing well today.  Her breast cancer is stable.  I reviewed her mammogram from 11/2013 with her in detail.  She is tolerating the Anastrazole well without very many side effects and will continue this.  I reviewed her CBC with her in detail which is normal, a CMP is pending.  She is due for a repeat bone density, but has declined this today.  She will repeat a mammogram in June, 2016 and f/u with Dr. Burr Medico afterwards.    Erin Charles will f/u in 6 months for labs and follow up with Dr. Burr Medico.   I spent 25 minutes counseling the patient face to face.  The total time spent in the appointment was 30 minutes.   Minette Headland, [place] [phone]

## 2014-05-18 NOTE — Telephone Encounter (Signed)
Lab results reviewed with patient who verbalized understanding and gratitude

## 2014-05-20 ENCOUNTER — Other Ambulatory Visit: Payer: Self-pay | Admitting: Physical Medicine & Rehabilitation

## 2014-05-20 ENCOUNTER — Ambulatory Visit (HOSPITAL_BASED_OUTPATIENT_CLINIC_OR_DEPARTMENT_OTHER): Payer: Federal, State, Local not specified - PPO | Admitting: Physical Medicine & Rehabilitation

## 2014-05-20 ENCOUNTER — Encounter: Payer: Self-pay | Admitting: Physical Medicine & Rehabilitation

## 2014-05-20 ENCOUNTER — Encounter: Payer: Federal, State, Local not specified - PPO | Attending: Physical Medicine & Rehabilitation

## 2014-05-20 VITALS — BP 143/68 | HR 80 | Resp 14 | Ht <= 58 in | Wt 124.0 lb

## 2014-05-20 DIAGNOSIS — G894 Chronic pain syndrome: Secondary | ICD-10-CM | POA: Insufficient documentation

## 2014-05-20 DIAGNOSIS — Z79899 Other long term (current) drug therapy: Secondary | ICD-10-CM | POA: Diagnosis not present

## 2014-05-20 DIAGNOSIS — Z5181 Encounter for therapeutic drug level monitoring: Secondary | ICD-10-CM

## 2014-05-20 DIAGNOSIS — M7061 Trochanteric bursitis, right hip: Secondary | ICD-10-CM | POA: Insufficient documentation

## 2014-05-20 NOTE — Progress Notes (Signed)
   Subjective:    Patient ID: Erin Charles, female    DOB: 12/10/1922, 80 y.o.   MRN: [id number]  HPI  Pain Inventory Average Pain 6 Pain Right Now 6 My pain is constant, burning, stabbing and aching  In the last 24 hours, has pain interfered with the following? General activity 5 Relation with others 5 Enjoyment of life 5 What TIME of day is your pain at its worst? daytime Sleep (in general) Good  Pain is worse with: walking, bending and unsure Pain improves with: rest and heat/ice Relief from Meds: 4  Mobility use a cane  Function retired  Neuro/Psych tingling trouble walking  Prior Studies Any changes since last visit?  no  Physicians involved in your care Any changes since last visit?  no   Family History  Problem Relation Age of Onset  . Cancer Mother     Bladder cancer  . Heart failure Father   . Heart attack Sister    History   Social History  . Marital Status: Married    Spouse Name: N/A    Number of Children: N/A  . Years of Education: N/A   Social History Main Topics  . Smoking status: Never Smoker   . Smokeless tobacco: Never Used  . Alcohol Use: No  . Drug Use: No  . Sexual Activity: Not Currently    Birth Control/ Protection: Post-menopausal   Other Topics Concern  . None   Social History Narrative   Past Surgical History  Procedure Laterality Date  . Insert / replace / remove pacemaker  08/30/10    DUAL CHAMBER/ST. JUDE  . Pilonidal cyst excision    . Hernia repair    . Eye surgery    . Pacemaker insertion     Past Medical History  Diagnosis Date  . HTN (hypertension)   . Dyslipidemia   . Severe aortic stenosis     s/p echo in 2010 with AVA of .77  . Complete heart block     Pacer  . RBBB (right bundle branch block)   . Advanced age   . OA (osteoarthritis)   . Aortic stenosis   . 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 Macular degeneration    BP 143/68 mmHg  Pulse 80  Resp 14  Ht 4\' 10"   (1.473 m)  Wt 124 lb (56.246 kg)  BMI 25.92 kg/m2  SpO2 96%  Opioid Risk Score:   Fall Risk Score: Moderate Fall Risk (6-13 points)  Review of Systems  Constitutional: Negative.   HENT: Negative.   Eyes: Negative.   Respiratory: Negative.   Cardiovascular: Negative.   Gastrointestinal: Negative.   Endocrine: Negative.   Genitourinary: Negative.   Musculoskeletal: Positive for myalgias and back pain.  Skin: Negative.   Allergic/Immunologic: Negative.   Neurological: Negative.   Hematological: Negative.   Psychiatric/Behavioral: Negative.        Objective:   Physical Exam        Assessment & Plan:

## 2014-05-20 NOTE — Patient Instructions (Signed)
Hip Exercises RANGE OF MOTION (ROM) AND STRETCHING EXERCISES  These exercises may help you when beginning to rehabilitate your injury. Doing them too aggressively can worsen your condition. Complete them slowly and gently. Your symptoms may resolve with or without further involvement from your physician, physical therapist or athletic trainer. While completing these exercises, remember:   Restoring tissue flexibility helps normal motion to return to the joints. This allows healthier, less painful movement and activity.  An effective stretch should be held for at least 30 seconds.  A stretch should never be painful. You should only feel a gentle lengthening or release in the stretched tissue. If these stretches worsen your symptoms even when done gently, consult your physician, physical therapist or athletic trainer. STRETCH - Hamstrings, Supine   Lie on your back. Loop a belt or towel over the ball of your right / left foot.  Straighten your right / left knee and slowly pull on the belt to raise your leg. Do not allow the right / left knee to bend. Keep your opposite leg flat on the floor.  Raise the leg until you feel a gentle stretch behind your right / left knee or thigh. Hold this position for __________ seconds. Repeat __________ times. Complete this stretch __________ times per day.  STRETCH - Hip Rotators   Lie on your back on a firm surface. Grasp your right / left knee with your right / left hand and your ankle with your opposite hand.  Keeping your hips and shoulders firmly planted, gently pull your right / left knee and rotate your lower leg toward your opposite shoulder until you feel a stretch in your buttocks.  Hold this stretch for __________ seconds. Repeat this stretch __________ times. Complete this stretch __________ times per day. STRETCH - Hamstrings/Adductors, V-Sit   Sit on the floor with your legs extended in a large "V," keeping your knees straight.  With your  head and chest upright, bend at your waist reaching for your right foot to stretch your left adductors.  You should feel a stretch in your left inner thigh. Hold for __________ seconds.  Return to the upright position to relax your leg muscles.  Continuing to keep your chest upright, bend straight forward at your waist to stretch your hamstrings.  You should feel a stretch behind both of your thighs and/or knees. Hold for __________ seconds.  Return to the upright position to relax your leg muscles.  Repeat steps 2 through 4 for opposite leg. Repeat __________ times. Complete this exercise __________ times per day.  STRETCHING - Hip Flexors, Lunge  Half kneel with your right / left knee on the floor and your opposite knee bent and directly over your ankle.  Keep good posture with your head over your shoulders. Tighten your buttocks to point your tailbone downward; this will prevent your back from arching too much.  You should feel a gentle stretch in the front of your thigh and/or hip. If you do not feel any resistance, slightly slide your opposite foot forward and then slowly lunge forward so your knee once again lines up over your ankle. Be sure your tailbone remains pointed downward.  Hold this stretch for __________ seconds. Repeat __________ times. Complete this stretch __________ times per day. STRENGTHENING EXERCISES These exercises may help you when beginning to rehabilitate your injury. They may resolve your symptoms with or without further involvement from your physician, physical therapist or athletic trainer. While completing these exercises, remember:   Muscles  can gain both the endurance and the strength needed for everyday activities through controlled exercises.  Complete these exercises as instructed by your physician, physical therapist or athletic trainer. Progress the resistance and repetitions only as guided.  You may experience muscle soreness or fatigue, but the  pain or discomfort you are trying to eliminate should never worsen during these exercises. If this pain does worsen, stop and make certain you are following the directions exactly. If the pain is still present after adjustments, discontinue the exercise until you can discuss the trouble with your clinician. STRENGTH - Hip Extensors, Bridge   Lie on your back on a firm surface. Bend your knees and place your feet flat on the floor.  Tighten your buttocks muscles and lift your bottom off the floor until your trunk is level with your thighs. You should feel the muscles in your buttocks and back of your thighs working. If you do not feel these muscles, slide your feet 1-2 inches further away from your buttocks.  Hold this position for __________ seconds.  Slowly lower your hips to the starting position and allow your buttock muscles relax completely before beginning the next repetition.  If this exercise is too easy, you may cross your arms over your chest. Repeat __________ times. Complete this exercise __________ times per day.  STRENGTH - Hip Abductors, Straight Leg Raises  Be aware of your form throughout the entire exercise so that you exercise the correct muscles. Sloppy form means that you are not strengthening the correct muscles.  Lie on your side so that your head, shoulders, knee and hip line up. You may bend your lower knee to help maintain your balance. Your right / left leg should be on top.  Roll your hips slightly forward, so that your hips are stacked directly over each other and your right / left knee is facing forward.  Lift your top leg up 4-6 inches, leading with your heel. Be sure that your foot does not drift forward or that your knee does not roll toward the ceiling.  Hold this position for __________ seconds. You should feel the muscles in your outer hip lifting (you may not notice this until your leg begins to tire).  Slowly lower your leg to the starting position. Allow  the muscles to fully relax before beginning the next repetition. Repeat __________ times. Complete this exercise __________ times per day.  STRENGTH - Hip Adductors, Straight Leg Raises   Lie on your side so that your head, shoulders, knee and hip line up. You may place your upper foot in front to help maintain your balance. Your right / left leg should be on the bottom.  Roll your hips slightly forward, so that your hips are stacked directly over each other and your right / left knee is facing forward.  Tense the muscles in your inner thigh and lift your bottom leg 4-6 inches. Hold this position for __________ seconds.  Slowly lower your leg to the starting position. Allow the muscles to fully relax before beginning the next repetition. Repeat __________ times. Complete this exercise __________ times per day.  STRENGTH - Quadriceps, Straight Leg Raises  Quality counts! Watch for signs that the quadriceps muscle is working to insure you are strengthening the correct muscles and not "cheating" by substituting with healthier muscles.  Lay on your back with your right / left leg extended and your opposite knee bent.  Tense the muscles in the front of your right / left  thigh. You should see either your knee cap slide up or increased dimpling just above the knee. Your thigh may even quiver.  Tighten these muscles even more and raise your leg 4 to 6 inches off the floor. Hold for right / left seconds.  Keeping these muscles tense, lower your leg.  Relax the muscles slowly and completely in between each repetition. Repeat __________ times. Complete this exercise __________ times per day.  STRENGTH - Hip Abductors, Standing  Tie one end of a rubber exercise band/tubing to a secure surface (table, pole) and tie a loop at the other end.  Place the loop around your right / left ankle. Keeping your ankle with the band directly opposite of the secured end, step away until there is tension in the  tube/band.  Hold onto a chair as needed for balance.  Keeping your back upright, your shoulders over your hips, and your toes pointing forward, lift your right / left leg out to your side. Be sure to lift your leg with your hip muscles. Do not "throw" your leg or tip your body to lift your leg.  Slowly and with control, return to the starting position. Repeat exercise __________ times. Complete this exercise __________ times per day.  STRENGTH - Quadriceps, Squats  Stand in a door frame so that your feet and knees are in line with the frame.  Use your hands for balance, not support, on the frame.  Slowly lower your weight, bending at the hips and knees. Keep your lower legs upright so that they are parallel with the door frame. Squat only within the range that does not increase your knee pain. Never let your hips drop below your knees.  Slowly return upright, pushing with your legs, not pulling with your hands. Document Released: 07/05/2005 Document Revised: 09/09/2011 Document Reviewed: 09/29/2008 [facility] Patient Information 2015 [place], [place]. This information is not intended to replace advice given to you by your health care provider. Make sure you discuss any questions you have with your health care provider.

## 2014-05-21 LAB — PMP ALCOHOL METABOLITE (ETG): Ethyl Glucuronide (EtG): NEGATIVE ng/mL

## 2014-05-24 NOTE — Progress Notes (Unsigned)
Right hip troch bursitis will inject today, not a good candidate for NSAID secondary to Eliquis Indication  Trochanteric bursitis. Exam has tenderness over the greater trochanter of the hip. Pain has not responded to conservative care such as exercise therapy and oral medications. Pain interferes with sleep or with mobility  Informed consent was obtained after describing risks and benefits of the procedure with the patient these include bleeding bruising and infection. Patient has signed written consent form. Patient placed in a lateral decubitus position with the affected hip superior. Point of maximal pain was palpated marked and prepped with Betadine and entered with a needle to bone contact. Needle slightly withdrawn then 6mg  of betamethasone with 4 cc 1% lidocaine were injected. Patient tolerated procedure well. Post procedure instructions given.

## 2014-05-27 ENCOUNTER — Other Ambulatory Visit: Payer: Self-pay | Admitting: Adult Health

## 2014-05-27 DIAGNOSIS — D0592 Unspecified type of carcinoma in situ of left breast: Secondary | ICD-10-CM

## 2014-05-27 LAB — OPIATES/OPIOIDS (LC/MS-MS)
Codeine Urine: 24695 ng/mL (ref <50)
HYDROCODONE: 76 ng/mL — AB (ref <50)
HYDROMORPHONE: 72 ng/mL — AB (ref <50)
Morphine Urine: 4287 ng/mL (ref <50)
NORHYDROCODONE, UR: 394 ng/mL — AB (ref <50)
Noroxycodone, Ur: NEGATIVE ng/mL (ref <50)
OXYMORPHONE, URINE: NEGATIVE ng/mL (ref <50)
Oxycodone, ur: NEGATIVE ng/mL (ref <50)

## 2014-05-27 LAB — ZOLPIDEM (LC/MS-MS), URINE
ZOLPIDEM (GC/LC/MS), UR CONFIRM: 41 ng/mL (ref <5)
Zolpidem metabolite (GC/LC/MS) Ur, confirm: 654 ng/mL (ref <5)

## 2014-05-28 LAB — PRESCRIPTION MONITORING PROFILE (SOLSTAS)
AMPHETAMINE/METH: NEGATIVE ng/mL
BENZODIAZEPINE SCREEN, URINE: NEGATIVE ng/mL
Barbiturate Screen, Urine: NEGATIVE ng/mL
Buprenorphine, Urine: NEGATIVE ng/mL
CARISOPRODOL, URINE: NEGATIVE ng/mL
COCAINE METABOLITES: NEGATIVE ng/mL
Cannabinoid Scrn, Ur: NEGATIVE ng/mL
Creatinine, Urine: 96.8 mg/dL (ref >20.0)
Fentanyl, Ur: NEGATIVE ng/mL
MDMA URINE: NEGATIVE ng/mL
Meperidine, Ur: NEGATIVE ng/mL
Methadone Screen, Urine: NEGATIVE ng/mL
Nitrites, Initial: NEGATIVE ug/mL
Oxycodone Screen, Ur: NEGATIVE ng/mL
PROPOXYPHENE: NEGATIVE ng/mL
TRAMADOL UR: NEGATIVE ng/mL
Tapentadol, urine: NEGATIVE ng/mL
pH, Initial: 5.5 pH (ref 4.5–8.9)

## 2014-06-02 ENCOUNTER — Ambulatory Visit: Payer: Federal, State, Local not specified - PPO | Admitting: Physical Medicine & Rehabilitation

## 2014-06-03 ENCOUNTER — Other Ambulatory Visit: Payer: Self-pay | Admitting: Physical Medicine & Rehabilitation

## 2014-06-04 ENCOUNTER — Other Ambulatory Visit: Payer: Self-pay | Admitting: Cardiovascular Disease

## 2014-06-07 ENCOUNTER — Other Ambulatory Visit: Payer: Self-pay | Admitting: *Deleted

## 2014-06-07 MED ORDER — POTASSIUM CHLORIDE CRYS ER 10 MEQ PO TBCR: 10.0000 meq | EXTENDED_RELEASE_TABLET | Freq: Every day | ORAL | Status: DC

## 2014-06-08 NOTE — Progress Notes (Signed)
Urine drug screen for this encounter was consistent for prescribed Tylenol #3 and Ambien.

## 2014-06-09 ENCOUNTER — Other Ambulatory Visit: Payer: Self-pay | Admitting: Physical Medicine & Rehabilitation

## 2014-06-13 ENCOUNTER — Other Ambulatory Visit: Payer: Self-pay | Admitting: *Deleted

## 2014-06-13 MED ORDER — ACETAMINOPHEN-CODEINE #3 300-30 MG PO TABS: 1.0000 | ORAL_TABLET | Freq: Three times a day (TID) | ORAL | Status: DC | PRN

## 2014-06-13 NOTE — Telephone Encounter (Signed)
Called in RX for Tylenol #3 #90 RF#3

## 2014-06-21 ENCOUNTER — Encounter: Payer: Self-pay | Admitting: Internal Medicine

## 2014-07-28 ENCOUNTER — Encounter: Payer: Self-pay | Admitting: Internal Medicine

## 2014-07-28 ENCOUNTER — Ambulatory Visit (INDEPENDENT_AMBULATORY_CARE_PROVIDER_SITE_OTHER): Payer: Federal, State, Local not specified - PPO | Admitting: *Deleted

## 2014-07-28 DIAGNOSIS — I442 Atrioventricular block, complete: Secondary | ICD-10-CM

## 2014-07-28 LAB — MDC_IDC_ENUM_SESS_TYPE_REMOTE
Battery Remaining Longevity: 73 mo
Battery Remaining Percentage: 67 %
Battery Voltage: 2.93 V
Brady Statistic AP VP Percent: 15 %
Brady Statistic AP VS Percent: 1 %
Brady Statistic AS VP Percent: 85 %
Brady Statistic AS VS Percent: 1 %
Brady Statistic RA Percent Paced: 14 %
Brady Statistic RV Percent Paced: 99 %
Date Time Interrogation Session: 20160128074217
Implantable Pulse Generator Model: 2210
Implantable Pulse Generator Serial Number: 7210946
Lead Channel Impedance Value: 290 Ohm
Lead Channel Impedance Value: 550 Ohm
Lead Channel Pacing Threshold Amplitude: 1 V
Lead Channel Pacing Threshold Amplitude: 1.25 V
Lead Channel Pacing Threshold Pulse Width: 0.5 ms
Lead Channel Pacing Threshold Pulse Width: 0.5 ms
Lead Channel Sensing Intrinsic Amplitude: 1 mV
Lead Channel Setting Pacing Amplitude: 1.5 V
Lead Channel Setting Pacing Amplitude: 2 V
Lead Channel Setting Pacing Pulse Width: 0.5 ms
Lead Channel Setting Sensing Sensitivity: 1.5 mV

## 2014-07-28 NOTE — Progress Notes (Signed)
Remote pacemaker transmission.   

## 2014-08-03 ENCOUNTER — Telehealth: Payer: Self-pay | Admitting: Cardiovascular Disease

## 2014-08-03 ENCOUNTER — Encounter: Payer: Self-pay | Admitting: Cardiology

## 2014-08-03 NOTE — Telephone Encounter (Signed)
Pt c/o of Chest Pain: 1. Are you having CP right now? No 2. Are you experiencing any other symptoms (ex. SOB, nausea, vomiting, sweating)? No 3. How long have you been experiencing CP? 2weeks 4. Is your CP continuous or coming and going? Come and go 5. Have you taken Nitroglycerin? No

## 2014-08-03 NOTE — Telephone Encounter (Signed)
Spoke with patient who states she has been experiencing intermittent chest pain for a while; states it occurred all throughout the day yesterday and she has had some pain today, especially after coming in from rain and changing clothes.  Patient states she is not experiencing pain presently.  She states pain improves when sitting or laying down; denies SOB, nausea, diaphoresis, or dizziness.  Patient states the pain does not radiate; rates pain 7 on 0-10 scale with 10 being worst pain.  Patient does not have documented hx of CAD; reports she takes all medications as prescribed.  Patient reports pacemaker and chart review shows transmission completed on 1/28.  Transmission was read by Melton Alar, LPN who reports that there is nothing on transmission relevant to patient's c/o chest pain. I scheduled patient for appointment with Dr. Acie Fredrickson on Friday, 2/5 and advised her that if pain worsens or if she develops any of the symptoms above such as nausea, diaphoresis, SOB, dizziness, to call 911 for transport to the hospital.  Patient verbalized understanding and agreement.

## 2014-08-04 NOTE — Telephone Encounter (Signed)
Agree with assessment and plan by Christen Bame, RN

## 2014-08-05 ENCOUNTER — Encounter: Payer: Self-pay | Admitting: Nurse Practitioner

## 2014-08-05 ENCOUNTER — Encounter: Payer: Self-pay | Admitting: Cardiovascular Disease

## 2014-08-05 ENCOUNTER — Ambulatory Visit (INDEPENDENT_AMBULATORY_CARE_PROVIDER_SITE_OTHER): Payer: Federal, State, Local not specified - PPO | Admitting: Cardiovascular Disease

## 2014-08-05 VITALS — BP 134/50 | HR 62 | Ht <= 58 in | Wt 127.8 lb

## 2014-08-05 DIAGNOSIS — I10 Essential (primary) hypertension: Secondary | ICD-10-CM

## 2014-08-05 DIAGNOSIS — I25110 Atherosclerotic heart disease of native coronary artery with unstable angina pectoris: Secondary | ICD-10-CM

## 2014-08-05 DIAGNOSIS — I4891 Unspecified atrial fibrillation: Secondary | ICD-10-CM

## 2014-08-05 DIAGNOSIS — I350 Nonrheumatic aortic (valve) stenosis: Secondary | ICD-10-CM

## 2014-08-05 DIAGNOSIS — I200 Unstable angina: Secondary | ICD-10-CM

## 2014-08-05 LAB — BASIC METABOLIC PANEL
BUN: 18 mg/dL (ref 6–23)
CO2: 31 mEq/L (ref 19–32)
Calcium: 9.7 mg/dL (ref 8.4–10.5)
Chloride: 99 mEq/L (ref 96–112)
Creatinine, Ser: 0.87 mg/dL (ref 0.40–1.20)
GFR: 65.03 mL/min (ref >60.00)
Glucose, Bld: 93 mg/dL (ref 70–99)
Potassium: 4.1 mEq/L (ref 3.5–5.1)
Sodium: 136 mEq/L (ref 135–145)

## 2014-08-05 LAB — CBC WITH DIFFERENTIAL/PLATELET
BASOS PCT: 0.6 % (ref 0.0–3.0)
Basophils Absolute: 0 10*3/uL (ref 0.0–0.1)
Eosinophils Absolute: 0.3 10*3/uL (ref 0.0–0.7)
Eosinophils Relative: 4.9 % (ref 0.0–5.0)
HCT: 35.5 % — ABNORMAL LOW (ref 36.0–46.0)
Hemoglobin: 11.9 g/dL — ABNORMAL LOW (ref 12.0–15.0)
Lymphocytes Relative: 21.6 % (ref 12.0–46.0)
Lymphs Abs: 1.2 10*3/uL (ref 0.7–4.0)
MCHC: 33.6 g/dL (ref 30.0–36.0)
MCV: 79.6 fl (ref 78.0–100.0)
MONO ABS: 0.6 10*3/uL (ref 0.1–1.0)
MONOS PCT: 11.1 % (ref 3.0–12.0)
Neutro Abs: 3.5 10*3/uL (ref 1.4–7.7)
Neutrophils Relative %: 61.8 % (ref 43.0–77.0)
Platelets: 213 10*3/uL (ref 150.0–400.0)
RBC: 4.45 Mil/uL (ref 3.87–5.11)
RDW: 16.3 % — AB (ref 11.5–15.5)
WBC: 5.7 10*3/uL (ref 4.0–10.5)

## 2014-08-05 LAB — PROTIME-INR
INR: 1.1 ratio — AB (ref 0.8–1.0)
PROTHROMBIN TIME: 11.7 s (ref 9.6–13.1)

## 2014-08-05 LAB — HEPATIC FUNCTION PANEL
ALK PHOS: 117 U/L (ref 39–117)
ALT: 10 U/L (ref 0–35)
AST: 22 U/L (ref 0–37)
Albumin: 3.9 g/dL (ref 3.5–5.2)
BILIRUBIN DIRECT: 0.1 mg/dL (ref 0.0–0.3)
Total Bilirubin: 0.4 mg/dL (ref 0.2–1.2)
Total Protein: 6.3 g/dL (ref 6.0–8.3)

## 2014-08-05 MED ORDER — NITROGLYCERIN 0.4 MG SL SUBL: 0.4000 mg | SUBLINGUAL_TABLET | SUBLINGUAL | Status: DC | PRN

## 2014-08-05 NOTE — Progress Notes (Signed)
Cardiology Office Note   Date:  08/05/2014   ID:  Erin Charles, DOB [birth year], MRN [id number]  PCP:  Erin Pac, MD  Cardiologist:   Erin Headings, MD   Chief Complaint  Patient presents with  . Follow-up    aortic stenosis   Problem List: 1. Hypertension 2. Aortic stenosis - severe , mean gradient of 42, peak gradient of 69. 3. Chest pain 4. Pre-canerous breast mass 5. Macular degeneration 6. Pacer -St. Jude dual- chamber pacemaker 08/29/10   History of Present Illness: Erin Charles is a 81 y.o. female who presents for evalualtion of some episodes of chest discomfort. Pt complains of CP - only with walking .  Better with rest.  For the past  Several weeks , worse over the past 3 days.   Last for several minutes.   Mid / left upper chest , radiates around to the back .  Not associated with dyspnea, no sweats, no pre-syncope    Past Medical History  Diagnosis Date  . HTN (hypertension)   . Dyslipidemia   . Severe aortic stenosis     s/p echo in 2010 with AVA of .77  . Complete heart block     Pacer  . RBBB (right bundle branch block)   . Advanced age   . OA (osteoarthritis)   . Aortic stenosis   . 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 Macular degeneration     Past Surgical History  Procedure Laterality Date  . Insert / replace / remove pacemaker  08/30/10    DUAL CHAMBER/ST. JUDE  . Pilonidal cyst excision    . Hernia repair    . Eye surgery    . Pacemaker insertion       Current Outpatient Prescriptions  Medication Sig Dispense Refill  . acetaminophen-codeine (TYLENOL #3) 300-30 MG per tablet Take 1 tablet by mouth every 8 (eight) hours as needed for moderate pain. 90 tablet 3  . ALPRAZolam (XANAX) 0.25 MG tablet Take 0.25 mg by mouth every 8 (eight) hours as needed. For anxiety    . anastrozole (ARIMIDEX) 1 MG tablet TAKE 1 TABLET (1 MG TOTAL) BY MOUTH DAILY. 30 tablet 5  . aspirin 81 MG tablet Take 81 mg by  mouth daily.    . clindamycin (CLEOCIN) 150 MG capsule Take 150 mg by mouth. 4 capsules 1 hour prior to dental work  2  . ELIQUIS 2.5 MG TABS tablet TAKE 1 TABLET BY MOUTH TWICE A DAY 60 tablet 1  . hydrochlorothiazide 25 MG tablet Take 25 mg by mouth daily.     Marland Kitchen IRON-VITAMIN C PO Take 1 tablet by mouth daily.     Marland Kitchen LYRICA 75 MG capsule Take 75 mg by mouth 2 (two) times daily.    . metoprolol succinate (TOPROL-XL) 50 MG 24 hr tablet Take 1 tablet (50 mg total) by mouth daily. 90 tablet 3  . nortriptyline (PAMELOR) 10 MG capsule Take 10 mg by mouth at bedtime.    . potassium chloride (KLOR-CON M10) 10 MEQ tablet Take 1 tablet (10 mEq total) by mouth daily. 90 tablet 0  . quinapril (ACCUPRIL) 40 MG tablet Take 40 mg by mouth 2 (two) times daily.    Marland Kitchen senna (SENOKOT) 8.6 MG TABS tablet Take 2 tablets by mouth at bedtime.    . simvastatin (ZOCOR) 40 MG tablet Take 40 mg by mouth at bedtime.     . Vitamin D, Ergocalciferol, (DRISDOL)  50000 UNITS CAPS capsule Take 50,000 Units by mouth every 7 (seven) days.     Marland Kitchen zolpidem (AMBIEN) 10 MG tablet Take 10 mg by mouth at bedtime.    Marland Kitchen zolpidem (AMBIEN) 5 MG tablet Take 5 mg by mouth at bedtime. For sleep  0   No current facility-administered medications for this visit.    Allergies:   Penicillins; Tolectin; and Macrodantin    Social History:  The patient  reports that she has never smoked. She has never used smokeless tobacco. She reports that she does not drink alcohol or use illicit drugs.   Family History:  The patient's family history includes Cancer in her mother; Heart attack in her sister; Heart failure in her father.    ROS:  Please see the history of present illness.    Review of Systems: Constitutional:  denies fever, chills, diaphoresis, appetite change and fatigue.  HEENT: denies photophobia, eye pain, redness, hearing loss, ear pain, congestion, sore throat, rhinorrhea, sneezing, neck pain, neck stiffness and tinnitus.    Respiratory: denies SOB, DOE, cough, chest tightness, and wheezing.  Cardiovascular: admits to chest pain,   Gastrointestinal: denies nausea, vomiting, abdominal pain, diarrhea, constipation, blood in stool.  Genitourinary: denies dysuria, urgency, frequency, hematuria, flank pain and difficulty urinating.  Musculoskeletal: denies  myalgias, back pain, joint swelling, arthralgias and gait problem.   Skin: denies pallor, rash and wound.  Neurological: denies dizziness, seizures, syncope, weakness, light-headedness, numbness and headaches.   Hematological: denies adenopathy, easy bruising, personal or family bleeding history.  Psychiatric/ Behavioral: denies suicidal ideation, mood changes, confusion, nervousness, sleep disturbance and agitation.       All other systems are reviewed and negative.    PHYSICAL EXAM: VS:  BP 134/50 mmHg  Pulse 62  Ht 4\' 10"  (1.473 m)  Wt 127 lb 12.8 oz (57.97 kg)  BMI 26.72 kg/m2 , BMI Body mass index is 26.72 kg/(m^2). GEN: Well nourished, well developed, in no acute distress, elderly  HEENT: normal Neck: no JVD,  Has bilateral carotid bruits,  Cardiac: RRR; 3/6 systolic  murmur, rubs, or gallops,no edema  Respiratory:  clear to auscultation bilaterally, normal work of breathing GI: soft, nontender, nondistended, + BS MS: no deformity or atrophy Skin: warm and dry, no rash Neuro:  Strength and sensation are intact Psych: normal   EKG:  EKG is ordered today. The ekg ordered today demonstrates AV pacing .    Recent Labs: 05/18/2014: ALT 10; BUN 20.4; Creatinine 0.9; Hemoglobin 11.7; Platelets 237; Potassium 4.5; Sodium 138    Lipid Panel    Component Value Date/Time   CHOL 87 08/04/2011 0610   TRIG 157* 08/04/2011 0610   HDL 28* 08/04/2011 0610   CHOLHDL 3.1 08/04/2011 0610   VLDL 31 08/04/2011 0610   LDLCALC 28 08/04/2011 0610      Wt Readings from Last 3 Encounters:  08/05/14 127 lb 12.8 oz (57.97 kg)  05/20/14 124 lb (56.246 kg)   05/18/14 123 lb 1 oz (55.821 kg)      Other studies Reviewed: Additional studies/ records that were reviewed today include: . Review of the above records demonstrates:    ASSESSMENT AND PLAN:   1. Hypertension - BP is well controlled  2. Aortic stenosis - severe , mean gradient of 42, peak gradient of 69. - She has a history of moderate to severe aortic stenosis for many years. It's possible that her current episodes of chest pain are related to her aortic stenosis. I do not  think that she is a good candidate for standard valve replacement but she may be a candidate for  TAVR   3. Chest pain -Ms.  Glaza presents with several weeks of progressive chest pain.. She now has exertional chest pain several times a day. Her pain is consistent with either coronary artery disease or perhaps pain related to her aortic stenosis.   Given her symptoms we will set her up for cardiac catheterization. We'll give her a prescription for nitroglycerin which she is to take if the pain comes very severe over the weekend.   4. Pre-canerous breast mass 5. Macular degeneration 6. Pacer -St. Jude dual- chamber pacemaker 08/29/10  Current medicines are reviewed at length with the patient today.  The patient does not have concerns regarding medicines.  The following changes have been made:  no change  Disposition:   FU with me in 3 months     Signed, Kazumi Lachney, Wonda Cheng, MD  08/05/2014 8:30 AM    [facility] [address], [place], [place]  [address] Phone: [phone]; Fax: [phone]

## 2014-08-05 NOTE — Patient Instructions (Addendum)
Your physician has recommended you make the following change in your medication:  TAKE Nitroglycerin for SEVERE pain - place 1 pill under tongue and let it dissolve  Your physician recommends that you have lab work TODAY:  PT/INR, Liver, CBC, Basic Metabolic panel  Your physician has requested that you have a cardiac catheterization. Cardiac catheterization is used to diagnose and/or treat various heart conditions. Doctors may recommend this procedure for a number of different reasons. The most common reason is to evaluate chest pain. Chest pain can be a symptom of coronary artery disease (CAD), and cardiac catheterization can show whether plaque is narrowing or blocking your heart's arteries. This procedure is also used to evaluate the valves, as well as measure the blood flow and oxygen levels in different parts of your heart. For further information please visit [email or web address]. Please follow instruction sheet, as given.  Your physician recommends that you schedule a follow-up appointment in: 3 months with Dr. Acie Fredrickson

## 2014-08-09 ENCOUNTER — Encounter (HOSPITAL_COMMUNITY)
Admission: RE | Disposition: A | Discharge status: Home / Self Care | Payer: Self-pay | Source: Ambulatory Visit | Attending: Cardiovascular Disease

## 2014-08-09 ENCOUNTER — Encounter (HOSPITAL_COMMUNITY): Payer: Self-pay | Admitting: *Deleted

## 2014-08-09 ENCOUNTER — Ambulatory Visit (HOSPITAL_COMMUNITY)
Admission: RE | Admit: 2014-08-09 | Discharge: 2014-08-09 | Disposition: A | Discharge status: Home / Self Care | Payer: Federal, State, Local not specified - PPO | Source: Ambulatory Visit | Attending: Cardiovascular Disease | Admitting: Cardiovascular Disease

## 2014-08-09 DIAGNOSIS — Z79899 Other long term (current) drug therapy: Secondary | ICD-10-CM | POA: Insufficient documentation

## 2014-08-09 DIAGNOSIS — Z950 Presence of cardiac pacemaker: Secondary | ICD-10-CM | POA: Insufficient documentation

## 2014-08-09 DIAGNOSIS — I2510 Atherosclerotic heart disease of native coronary artery without angina pectoris: Secondary | ICD-10-CM

## 2014-08-09 DIAGNOSIS — I350 Nonrheumatic aortic (valve) stenosis: Secondary | ICD-10-CM | POA: Diagnosis present

## 2014-08-09 DIAGNOSIS — I10 Essential (primary) hypertension: Secondary | ICD-10-CM | POA: Diagnosis not present

## 2014-08-09 DIAGNOSIS — E785 Hyperlipidemia, unspecified: Secondary | ICD-10-CM | POA: Diagnosis not present

## 2014-08-09 DIAGNOSIS — I25119 Atherosclerotic heart disease of native coronary artery with unspecified angina pectoris: Secondary | ICD-10-CM | POA: Diagnosis not present

## 2014-08-09 DIAGNOSIS — M1990 Unspecified osteoarthritis, unspecified site: Secondary | ICD-10-CM | POA: Insufficient documentation

## 2014-08-09 HISTORY — PX: LEFT AND RIGHT HEART CATHETERIZATION WITH CORONARY ANGIOGRAM: SHX5449

## 2014-08-09 LAB — CBC
HEMATOCRIT: 33.9 % — AB (ref 36.0–46.0)
Hemoglobin: 11 g/dL — ABNORMAL LOW (ref 12.0–15.0)
MCH: 27 pg (ref 26.0–34.0)
MCHC: 32.4 g/dL (ref 30.0–36.0)
MCV: 83.3 fL (ref 78.0–100.0)
PLATELETS: 188 10*3/uL (ref 150–400)
RBC: 4.07 MIL/uL (ref 3.87–5.11)
RDW: 15.9 % — ABNORMAL HIGH (ref 11.5–15.5)
WBC: 6.1 10*3/uL (ref 4.0–10.5)

## 2014-08-09 LAB — POCT I-STAT 3, VENOUS BLOOD GAS (G3P V)
Bicarbonate: 25.4 mEq/L — ABNORMAL HIGH (ref 20.0–24.0)
O2 Saturation: 67 %
TCO2: 27 mmol/L (ref 0–100)
pCO2, Ven: 43.5 mmHg — ABNORMAL LOW (ref 45.0–50.0)
pH, Ven: 7.374 — ABNORMAL HIGH (ref 7.250–7.300)
pO2, Ven: 36 mmHg (ref 30.0–45.0)

## 2014-08-09 SURGERY — LEFT AND RIGHT HEART CATHETERIZATION WITH CORONARY ANGIOGRAM
Anesthesia: LOCAL

## 2014-08-09 MED ORDER — SODIUM CHLORIDE 0.9 % IV SOLN
INTRAVENOUS | Status: DC
  Administered 2014-08-09: 75 mL/h via INTRAVENOUS

## 2014-08-09 MED ORDER — ASPIRIN 81 MG PO CHEW
81.0000 mg | CHEWABLE_TABLET | ORAL | Status: AC
  Administered 2014-08-09: 81 mg via ORAL

## 2014-08-09 MED ORDER — NITROGLYCERIN 1 MG/10 ML FOR IR/CATH LAB
INTRA_ARTERIAL | Status: AC
  Filled 2014-08-09: qty 10

## 2014-08-09 MED ORDER — FENTANYL CITRATE 0.05 MG/ML IJ SOLN
INTRAMUSCULAR | Status: AC
  Filled 2014-08-09: qty 2

## 2014-08-09 MED ORDER — SODIUM CHLORIDE 0.9 % IV SOLN: INTRAVENOUS | Status: AC

## 2014-08-09 MED ORDER — ASPIRIN 81 MG PO CHEW
CHEWABLE_TABLET | ORAL | Status: AC
  Filled 2014-08-09: qty 1

## 2014-08-09 MED ORDER — MIDAZOLAM HCL 2 MG/2ML IJ SOLN
INTRAMUSCULAR | Status: AC
  Filled 2014-08-09: qty 2

## 2014-08-09 MED ORDER — ACETAMINOPHEN-CODEINE #3 300-30 MG PO TABS
ORAL_TABLET | ORAL | Status: AC
  Filled 2014-08-09: qty 1

## 2014-08-09 MED ORDER — LIDOCAINE HCL (PF) 1 % IJ SOLN
INTRAMUSCULAR | Status: AC
  Filled 2014-08-09: qty 30

## 2014-08-09 MED ORDER — HEPARIN (PORCINE) IN NACL 2-0.9 UNIT/ML-% IJ SOLN
INTRAMUSCULAR | Status: AC
  Filled 2014-08-09: qty 1000

## 2014-08-09 MED ORDER — ACETAMINOPHEN-CODEINE #3 300-30 MG PO TABS
1.0000 | ORAL_TABLET | Freq: Four times a day (QID) | ORAL | Status: DC | PRN
Start: 2014-08-09 — End: 2014-08-09
  Administered 2014-08-09: 1 via ORAL
  Filled 2014-08-09: qty 2

## 2014-08-09 NOTE — Progress Notes (Addendum)
Site area: RFA Site Prior to Removal:  Level 0 Pressure Applied For: greater than 60 min Manual:   yes Patient Status During Pull:  stable Post Pull Site:  Level 2 Post Pull Instructions Given:  yes Post Pull Pulses Present: palpable Dressing Applied:   Bedrest begins @ 1300 Comments: by canderson, assisted by Eddie Dibbles and [place]. Large hematoma, marked and examined by Dr Shon Hough ( at 1240 pm)

## 2014-08-09 NOTE — H&P (View-Only) (Signed)
Cardiology Office Note   Date:  08/05/2014   ID:  SEERAT PEADEN, DOB 1923-05-15, MRN [id number]  PCP:  Gennette Pac, MD  Cardiologist:   Thayer Headings, MD   Chief Complaint  Patient presents with  . Follow-up    aortic stenosis   Problem List: 1. Hypertension 2. Aortic stenosis - severe , mean gradient of 42, peak gradient of 69. 3. Chest pain 4. Pre-canerous breast mass 5. Macular degeneration 6. Pacer -St. Jude dual- chamber pacemaker 08/29/10   History of Present Illness: KOLEEN CELIA is a 81 y.o. female who presents for evalualtion of some episodes of chest discomfort. Pt complains of CP - only with walking .  Better with rest.  For the past  Several weeks , worse over the past 3 days.   Last for several minutes.   Mid / left upper chest , radiates around to the back .  Not associated with dyspnea, no sweats, no pre-syncope    Past Medical History  Diagnosis Date  . HTN (hypertension)   . Dyslipidemia   . Severe aortic stenosis     s/p echo in 2010 with AVA of .77  . Complete heart block     Pacer  . RBBB (right bundle branch block)   . Advanced age   . OA (osteoarthritis)   . Aortic stenosis   . 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 Macular degeneration     Past Surgical History  Procedure Laterality Date  . Insert / replace / remove pacemaker  08/30/10    DUAL CHAMBER/ST. JUDE  . Pilonidal cyst excision    . Hernia repair    . Eye surgery    . Pacemaker insertion       Current Outpatient Prescriptions  Medication Sig Dispense Refill  . acetaminophen-codeine (TYLENOL #3) 300-30 MG per tablet Take 1 tablet by mouth every 8 (eight) hours as needed for moderate pain. 90 tablet 3  . ALPRAZolam (XANAX) 0.25 MG tablet Take 0.25 mg by mouth every 8 (eight) hours as needed. For anxiety    . anastrozole (ARIMIDEX) 1 MG tablet TAKE 1 TABLET (1 MG TOTAL) BY MOUTH DAILY. 30 tablet 5  . aspirin 81 MG tablet Take 81 mg by  mouth daily.    . clindamycin (CLEOCIN) 150 MG capsule Take 150 mg by mouth. 4 capsules 1 hour prior to dental work  2  . ELIQUIS 2.5 MG TABS tablet TAKE 1 TABLET BY MOUTH TWICE A DAY 60 tablet 1  . hydrochlorothiazide 25 MG tablet Take 25 mg by mouth daily.     Marland Kitchen IRON-VITAMIN C PO Take 1 tablet by mouth daily.     Marland Kitchen LYRICA 75 MG capsule Take 75 mg by mouth 2 (two) times daily.    . metoprolol succinate (TOPROL-XL) 50 MG 24 hr tablet Take 1 tablet (50 mg total) by mouth daily. 90 tablet 3  . nortriptyline (PAMELOR) 10 MG capsule Take 10 mg by mouth at bedtime.    . potassium chloride (KLOR-CON M10) 10 MEQ tablet Take 1 tablet (10 mEq total) by mouth daily. 90 tablet 0  . quinapril (ACCUPRIL) 40 MG tablet Take 40 mg by mouth 2 (two) times daily.    Marland Kitchen senna (SENOKOT) 8.6 MG TABS tablet Take 2 tablets by mouth at bedtime.    . simvastatin (ZOCOR) 40 MG tablet Take 40 mg by mouth at bedtime.     . Vitamin D, Ergocalciferol, (DRISDOL)  50000 UNITS CAPS capsule Take 50,000 Units by mouth every 7 (seven) days.     Marland Kitchen zolpidem (AMBIEN) 10 MG tablet Take 10 mg by mouth at bedtime.    Marland Kitchen zolpidem (AMBIEN) 5 MG tablet Take 5 mg by mouth at bedtime. For sleep  0   No current facility-administered medications for this visit.    Allergies:   Penicillins; Tolectin; and Macrodantin    Social History:  The patient  reports that she has never smoked. She has never used smokeless tobacco. She reports that she does not drink alcohol or use illicit drugs.   Family History:  The patient's family history includes Cancer in her mother; Heart attack in her sister; Heart failure in her father.    ROS:  Please see the history of present illness.    Review of Systems: Constitutional:  denies fever, chills, diaphoresis, appetite change and fatigue.  HEENT: denies photophobia, eye pain, redness, hearing loss, ear pain, congestion, sore throat, rhinorrhea, sneezing, neck pain, neck stiffness and tinnitus.    Respiratory: denies SOB, DOE, cough, chest tightness, and wheezing.  Cardiovascular: admits to chest pain,   Gastrointestinal: denies nausea, vomiting, abdominal pain, diarrhea, constipation, blood in stool.  Genitourinary: denies dysuria, urgency, frequency, hematuria, flank pain and difficulty urinating.  Musculoskeletal: denies  myalgias, back pain, joint swelling, arthralgias and gait problem.   Skin: denies pallor, rash and wound.  Neurological: denies dizziness, seizures, syncope, weakness, light-headedness, numbness and headaches.   Hematological: denies adenopathy, easy bruising, personal or family bleeding history.  Psychiatric/ Behavioral: denies suicidal ideation, mood changes, confusion, nervousness, sleep disturbance and agitation.       All other systems are reviewed and negative.    PHYSICAL EXAM: VS:  BP 134/50 mmHg  Pulse 62  Ht 4\' 10"  (1.473 m)  Wt 127 lb 12.8 oz (57.97 kg)  BMI 26.72 kg/m2 , BMI Body mass index is 26.72 kg/(m^2). GEN: Well nourished, well developed, in no acute distress, elderly  HEENT: normal Neck: no JVD,  Has bilateral carotid bruits,  Cardiac: RRR; 3/6 systolic  murmur, rubs, or gallops,no edema  Respiratory:  clear to auscultation bilaterally, normal work of breathing GI: soft, nontender, nondistended, + BS MS: no deformity or atrophy Skin: warm and dry, no rash Neuro:  Strength and sensation are intact Psych: normal   EKG:  EKG is ordered today. The ekg ordered today demonstrates AV pacing .    Recent Labs: 05/18/2014: ALT 10; BUN 20.4; Creatinine 0.9; Hemoglobin 11.7; Platelets 237; Potassium 4.5; Sodium 138    Lipid Panel    Component Value Date/Time   CHOL 87 08/04/2011 0610   TRIG 157* 08/04/2011 0610   HDL 28* 08/04/2011 0610   CHOLHDL 3.1 08/04/2011 0610   VLDL 31 08/04/2011 0610   LDLCALC 28 08/04/2011 0610      Wt Readings from Last 3 Encounters:  08/05/14 127 lb 12.8 oz (57.97 kg)  05/20/14 124 lb (56.246 kg)   05/18/14 123 lb 1 oz (55.821 kg)      Other studies Reviewed: Additional studies/ records that were reviewed today include: . Review of the above records demonstrates:    ASSESSMENT AND PLAN:   1. Hypertension - BP is well controlled  2. Aortic stenosis - severe , mean gradient of 42, peak gradient of 69. - She has a history of moderate to severe aortic stenosis for many years. It's possible that her current episodes of chest pain are related to her aortic stenosis. I do not  think that she is a good candidate for standard valve replacement but she may be a candidate for  TAVR   3. Chest pain -Ms.  Janoski presents with several weeks of progressive chest pain.. She now has exertional chest pain several times a day. Her pain is consistent with either coronary artery disease or perhaps pain related to her aortic stenosis.   Given her symptoms we will set her up for cardiac catheterization. We'll give her a prescription for nitroglycerin which she is to take if the pain comes very severe over the weekend.   4. Pre-canerous breast mass 5. Macular degeneration 6. Pacer -St. Jude dual- chamber pacemaker 08/29/10  Current medicines are reviewed at length with the patient today.  The patient does not have concerns regarding medicines.  The following changes have been made:  no change  Disposition:   FU with me in 3 months     Signed, Kerstie Agent, Wonda Cheng, MD  08/05/2014 8:30 AM    [facility] [address], [place], [place]  [address] Phone: [phone]; Fax: [phone]

## 2014-08-09 NOTE — Discharge Instructions (Signed)
Resume Eliquis tomorrow on 08/10/14.    Radial Site Care Refer to this sheet in the next few weeks. These instructions provide you with information on caring for yourself after your procedure. Your caregiver may also give you more specific instructions. Your treatment has been planned according to current medical practices, but problems sometimes occur. Call your caregiver if you have any problems or questions after your procedure. HOME CARE INSTRUCTIONS  You may shower the day after the procedure.Remove the bandage (dressing) and gently wash the site with plain soap and water.Gently pat the site dry.  Do not apply powder or lotion to the site.  Do not submerge the affected site in water for 3 to 5 days.  Inspect the site at least twice daily.  Do not flex or bend the affected arm for 24 hours.  No lifting over 5 pounds (2.3 kg) for 5 days after your procedure.  Do not drive home if you are discharged the same day of the procedure. Have someone else drive you.  You may drive 24 hours after the procedure unless otherwise instructed by your caregiver.  Do not operate machinery or power tools for 24 hours.  A responsible adult should be with you for the first 24 hours after you arrive home. What to expect:  Any bruising will usually fade within 1 to 2 weeks.  Blood that collects in the tissue (hematoma) may be painful to the touch. It should usually decrease in size and tenderness within 1 to 2 weeks. SEEK IMMEDIATE MEDICAL CARE IF:  You have unusual pain at the radial site.  You have redness, warmth, swelling, or pain at the radial site.  You have drainage (other than a small amount of blood on the dressing).  You have chills.  You have a fever or persistent symptoms for more than 72 hours.  You have a fever and your symptoms suddenly get worse.  Your arm becomes pale, cool, tingly, or numb.  You have heavy bleeding from the site. Hold pressure on the site and call  911. Document Released: 07/20/2010 Document Revised: 09/09/2011 Document Reviewed: 07/20/2010 [facility] Patient Information 2015 [place], [place]. This information is not intended to replace advice given to you by your health care provider. Make sure you discuss any questions you have with your health care provider.  Angiogram, Care After Refer to this sheet in the next few weeks. These instructions provide you with information on caring for yourself after your procedure. Your health care provider may also give you more specific instructions. Your treatment has been planned according to current medical practices, but problems sometimes occur. Call your health care provider if you have any problems or questions after your procedure.  WHAT TO EXPECT AFTER THE PROCEDURE After your procedure, it is typical to have the following sensations:  Minor discomfort or tenderness and a small bump at the catheter insertion site. The bump should usually decrease in size and tenderness within 1 to 2 weeks.  Any bruising will usually fade within 2 to 4 weeks. HOME CARE INSTRUCTIONS   You may need to keep taking blood thinners if they were prescribed for you. Take medicines only as directed by your health care provider.  Do not apply powder or lotion to the site.  Do not take baths, swim, or use a hot tub until your health care provider approves.  You may shower 24 hours after the procedure. Remove the bandage (dressing) and gently wash the site with plain soap and water.  Gently pat the site dry.  Inspect the site at least twice daily.  Limit your activity for the first 48 hours. Do not bend, squat, or lift anything over 20 lb (9 kg) or as directed by your health care provider.  Plan to have someone take you home after the procedure. Follow instructions about when you can drive or return to work. SEEK MEDICAL CARE IF:  You get light-headed when standing up.  You have drainage (other than a small amount of  blood on the dressing).  You have chills.  You have a fever.  You have redness, warmth, swelling, or pain at the insertion site. SEEK IMMEDIATE MEDICAL CARE IF:   You develop chest pain or shortness of breath, feel faint, or pass out.  You have bleeding, swelling larger than a walnut, or drainage from the catheter insertion site.  You develop pain, discoloration, coldness, or severe bruising in the leg or arm that held the catheter.  You develop bleeding from any other place, such as the bowels. You may see bright red blood in your urine or stools, or your stools may appear black and tarry.  You have heavy bleeding from the site. If this happens, hold pressure on the site and call 911. MAKE SURE YOU:  Understand these instructions.  Will watch your condition.  Will get help right away if you are not doing well or get worse. Document Released: 01/03/2005 Document Revised: 11/01/2013 Document Reviewed: 11/09/2012 [facility] Patient Information 2015 [place], [place]. This information is not intended to replace advice given to you by your health care provider. Make sure you discuss any questions you have with your health care provider.

## 2014-08-09 NOTE — Interval H&P Note (Signed)
History and Physical Interval Note:  08/09/2014 9:37 AM  Erin Charles  has presented today for surgery, with the diagnosis of AORTIC STENOSIS  The various methods of treatment have been discussed with the patient and family. After consideration of risks, benefits and other options for treatment, the patient has consented to  Procedure(s): LEFT AND RIGHT HEART CATHETERIZATION WITH CORONARY ANGIOGRAM (N/A) as a surgical intervention .  The patient's history has been reviewed, patient examined, no change in status, stable for surgery.  I have reviewed the patient's chart and labs.  Questions were answered to the patient's satisfaction.    Cath Lab Visit (complete for each Cath Lab visit)  Clinical Evaluation Leading to the Procedure:   ACS: No.  Non-ACS:    Anginal Classification: CCS III  Anti-ischemic medical therapy: Minimal Therapy (1 class of medications)  Non-Invasive Test Results: No non-invasive testing performed  Prior CABG: No previous CABG        Erin Charles

## 2014-08-09 NOTE — Progress Notes (Signed)
Erin Charles, [place] here to assess rt groin hematoma. Area approx 6 inches across by 6 inches. Faint bruising. Medicated for rt leg pain. Rt dp 2+.

## 2014-08-09 NOTE — Progress Notes (Signed)
Pt ambulated after completion of bedrest.  Right groin site stable.  Bruising noted from earlier but no changes with ambulation.  Pt ready for discharge.

## 2014-08-09 NOTE — Progress Notes (Addendum)
Paged by nursing staff regarding large R femoral hematoma after cath. Large hematoma size 6cm x 6cm noted. Good LE pulse. No bruit on exam. No pulsatile mass. Last dose of eliquis was on Sat, by now, should have left her body.  Repeat CBC in 2 hours. No significant pain. Likely hematoma. Lower suspicion for pseudoaneurysm. Compression held for 15 additional minute for a total of 1 hr. Marker used to draw lines around the area. If continue to increase, will obtain LE arterial U/S to r/o pseudoaneurysm.    Will recheck in 2 hours. Low threshhold to admit overnight for obs.  Hilbert Corrigan PA Pager: [phone]   Addendum:  2:43PM.  Rechecked in 2 hours. Hgb stable. Area soft, not enlarging. LE pulse present and stable. Overall stable without significant pain. Likely can be discharged today. Discussed with patient regarding potential symptoms associated with pseudoaneurysm include pain out of proportion, loss of distal pulse, LE numbness or tingling.   Hilbert Corrigan PA Pager: [phone]

## 2014-08-09 NOTE — CV Procedure (Addendum)
      Cardiac Catheterization Operative Report  Erin Charles [id number] 2/9/201610:29 AM Gennette Pac, MD  Procedure Performed:  1. Left Heart Catheterization 2. Selective Coronary Angiography 3. Right Heart Catheterization  Operator: Lauree Chandler, MD  Indication:   81 yo female with severe aortic valve stenosis with recent chest pain c/w angina. Cardiac cath to evaluate aortic valve, assess pressures and exclude obstructive CAD.                                Procedure Details: The risks, benefits, complications, treatment options, and expected outcomes were discussed with the patient. The patient and/or family concurred with the proposed plan, giving informed consent. The patient was brought to the cath lab after IV hydration was begun and oral premedication was given. The patient was further sedated with Versed and Fentanyl. There was an IV present in the right antecubital area. This area was prepped and draped a sterile fashion. I then placed a 5 French sheath in the right antecubital vein. A balloon tipped catheter was used to the perform a right heart cath. I could not wedge the catheter from the right arm so no wedge pressure was obtained. The right wrist was prepped and draped. I easily engaged the right radial artery but could not pass a wire beyond the forearm. A TR band was placed on the right wrist. The right groin was prepped and draped in the usual manner. Using the modified Seldinger access technique, a 5 French sheath was placed in the right femoral artery. Standard diagnostic catheters were used to perform selective coronary angiography. I crossed the aortic valve with a straight wire and JR4 catheter. No LV gram was performed. There were no immediate complications. The patient was taken to the recovery area in stable condition.   Hemodynamic Findings: Ao: 170/57               LV: 227/9/22 RA:  6            RV: 34/2/7 PA: 39/11 (mean 22)      PCWP:  Not  obtained Fick Cardiac Output: 4.1 L/min Fick Cardiac Index: 2.7 L/min/m2 Central Aortic Saturation: 97% Pulmonary Artery Saturation: 67%  Aortic valve data:  Peak to peak gradient: 42 mmHg Mean gradient: 33.5 mmHg AVA: 0.54 cm2  Angiographic Findings:  Left main: Ostial 30% stenosis  Left Anterior Descending Artery: Large caliber vessel that courses to the apex. There are mild luminal irregularities in the proximal, mid and distal vessel but no focally obstructive lesions. The first diagonal branch is a moderate caliber vessel with mid 40% stenosis. The second diagonal branch is small in caliber.   Circumflex Artery: Moderate caliber vessel with moderate caliber obtuse marginal branch. No obstructive disease.   Right Coronary Artery: Large dominant vessel with no obstructive disease.   Left Ventricular Angiogram: Deferred.   Impression: 1. Mild non-obstructive CAD 2. Severe aortic valve stenosis  Recommendations: She has no obstructive CAD. Her angina and dyspnea are most likely due to her AS. She would be a high risk candidate for traditional AVR given advanced age. She may be a good candidate for TAVR. Will see in valve clinic.        Complications:  None; patient tolerated the procedure well.

## 2014-08-09 NOTE — Progress Notes (Signed)
Right radial band removed.  Occlusive dressing with 2x2 applied.  Site dry and intact but with significant bruising. Instructed pt to continue to restrict the use of right hand/arm.

## 2014-08-10 ENCOUNTER — Telehealth: Payer: Self-pay | Admitting: Cardiovascular Disease

## 2014-08-10 NOTE — Telephone Encounter (Signed)
New Msg        Marilyne Haseley calling, pt daughter in law, has questions on how to care for pt.   Pt had cath completed yesterday with Dr. Angelena Form.    Please return call to [facility] at [phone].

## 2014-08-10 NOTE — Telephone Encounter (Signed)
Spoke with patient who states her daughter-in-law Fraser Din called to ask if groin dressing could be changed.  Patient states she had significant bleeding to the site; denies symptoms of pain, numbness, increased size of swelling or bruising and no bleeding noted.  I advised patient that she may change dressing with assistance, however if anything is stuck to the skin to leave it at this time and try to remove after shower in a few days.  Patient states there is currently a piece of gauze with a band-aid over it and she will replace with same type of dressing.  I advised her to wait until Fraser Din returns from taking her husband to an appointment.  I advised patient to call back with questions or concerns.  Patient verbalized understanding and agreement.

## 2014-08-11 ENCOUNTER — Other Ambulatory Visit: Payer: Self-pay | Admitting: Physical Medicine & Rehabilitation

## 2014-08-11 ENCOUNTER — Other Ambulatory Visit: Payer: Self-pay | Admitting: Cardiovascular Disease

## 2014-08-11 LAB — POCT I-STAT 3, ART BLOOD GAS (G3+)
Acid-Base Excess: 1 mmol/L (ref 0.0–2.0)
BICARBONATE: 26 meq/L — AB (ref 20.0–24.0)
O2 Saturation: 97 %
PH ART: 7.379 (ref 7.350–7.450)
TCO2: 27 mmol/L (ref 0–100)
pCO2 arterial: 44 mmHg (ref 35.0–45.0)
pO2, Arterial: 88 mmHg (ref 80.0–100.0)

## 2014-08-12 ENCOUNTER — Telehealth: Payer: Self-pay | Admitting: *Deleted

## 2014-08-12 NOTE — Telephone Encounter (Signed)
I spoke with pt about setting up appt for TAVR consult.  Pt states Dr.Nahser has talked with her about TAVR.  Pt reports she does not want to have TAVR done at this time.

## 2014-08-12 NOTE — Telephone Encounter (Signed)
I placed call to pt to schedule TAVR consult with Dr. Angelena Form for August 19, 2014 at 9:45.  Left message to call back

## 2014-08-13 NOTE — Telephone Encounter (Signed)
Phil, I will be glad to see her to discuss TAVR in the future if she changes her mind. Gerald Stabs

## 2014-08-15 ENCOUNTER — Other Ambulatory Visit: Payer: Self-pay | Admitting: Physical Medicine & Rehabilitation

## 2014-08-18 ENCOUNTER — Encounter: Payer: Federal, State, Local not specified - PPO | Attending: Physical Medicine & Rehabilitation

## 2014-08-18 ENCOUNTER — Ambulatory Visit (HOSPITAL_BASED_OUTPATIENT_CLINIC_OR_DEPARTMENT_OTHER): Payer: Federal, State, Local not specified - PPO | Admitting: Physical Medicine & Rehabilitation

## 2014-08-18 ENCOUNTER — Encounter: Payer: Self-pay | Admitting: Physical Medicine & Rehabilitation

## 2014-08-18 DIAGNOSIS — M7061 Trochanteric bursitis, right hip: Secondary | ICD-10-CM

## 2014-08-18 DIAGNOSIS — Z79899 Other long term (current) drug therapy: Secondary | ICD-10-CM

## 2014-08-18 DIAGNOSIS — S301XXA Contusion of abdominal wall, initial encounter: Secondary | ICD-10-CM | POA: Insufficient documentation

## 2014-08-18 DIAGNOSIS — M48061 Spinal stenosis, lumbar region without neurogenic claudication: Secondary | ICD-10-CM

## 2014-08-18 DIAGNOSIS — G894 Chronic pain syndrome: Secondary | ICD-10-CM | POA: Diagnosis present

## 2014-08-18 DIAGNOSIS — Z5181 Encounter for therapeutic drug level monitoring: Secondary | ICD-10-CM

## 2014-08-18 DIAGNOSIS — M4806 Spinal stenosis, lumbar region: Secondary | ICD-10-CM

## 2014-08-18 NOTE — Progress Notes (Signed)
Subjective:    Patient ID: Erin Charles, female    DOB: Jul 17, 1922, 81 y.o.   MRN: [id number]  HPI Patient returns today with right hip pain as well as low back pain. Low back pain is well relieved with Tylenol No. 3. The right hip pain has been well relieved with corticosteroid injections. Last injection approximately 3 months ago. Interval medical history positive for cardiac catheterization which showed nonobstructive coronary artery disease as well as severe aortic stenosis. Patient has noted extensive bruising in her right lower extremity since the procedure. She is on Eliquis  Pain Inventory Average Pain 8 Pain Right Now 8 My pain is stinging in right thigh  In the last 24 hours, has pain interfered with the following? General activity 4 Relation with others 0 Enjoyment of life 0 What TIME of day is your pain at its worst? night Sleep (in general) NA  Pain is worse with: walking Pain improves with: medication Relief from Meds: na  Mobility walk with assistance use a cane  Function retired I need assistance with the following:  household duties and shopping  Neuro/Psych trouble walking  Prior Studies Any changes since last visit?  yes  Had cardiac cath  2 weeks ago  Physicians involved in your care Mertie Moores cardiologist   Family History  Problem Relation Age of Onset  . Cancer Mother     Bladder cancer  . Heart failure Father   . Heart attack Sister    History   Social History  . Marital Status: Married    Spouse Name: N/A  . Number of Children: N/A  . Years of Education: N/A   Social History Main Topics  . Smoking status: Never Smoker   . Smokeless tobacco: Never Used  . Alcohol Use: No  . Drug Use: No  . Sexual Activity: Not Currently    Birth Control/ Protection: Post-menopausal   Other Topics Concern  . None   Social History Narrative   Past Surgical History  Procedure Laterality Date  . Insert / replace / remove pacemaker   08/30/10    DUAL CHAMBER/ST. JUDE  . Pilonidal cyst excision    . Hernia repair    . Eye surgery    . Pacemaker insertion    . Left and right heart catheterization with coronary angiogram N/A 08/09/2014    Procedure: LEFT AND RIGHT HEART CATHETERIZATION WITH CORONARY ANGIOGRAM;  Surgeon: Burnell Blanks, MD;  Location: [facility] CATH LAB;  Service: Cardiovascular;  Laterality: N/A;   Past Medical History  Diagnosis Date  . HTN (hypertension)   . Dyslipidemia   . Severe aortic stenosis     s/p echo in 2010 with AVA of .77  . Complete heart block     Pacer  . RBBB (right bundle branch block)   . Advanced age   . OA (osteoarthritis)   . Aortic stenosis   . 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 Macular degeneration    BP 141/71 mmHg  Resp 14  SpO2   Opioid Risk Score:   Fall Risk Score: High Fall Risk (>13 points) (educated and given handout on fall prevention)  Review of Systems  Musculoskeletal: Positive for arthralgias and gait problem.  All other systems reviewed and are negative.      Objective:   Physical Exam  Tenderness to palpation right hip, greater trochanter area Extensive ecchymosis in the medial thigh groin leg as well as lateral thigh on the  right side. 75% range internal/external rotation of the hip accompanied by pain at end range. Decreased hip flexion with passive ranging. This is also accompanied by pain. Negative straight leg raising Posture is kyphotic scoliosis convex left       Assessment & Plan:  1. Lumbar spinal stenosis without clear-cut evidence of neurogenic claudication Continue Tylenol 3  2. Right hip trochanteric bursitis, at this point has significant ecchymosis over injection site therefore we will reschedule once this resolves. Patient has been given hip exercises in the past but has not been performing these. Have reviewed the ones she is likely and able to do and she is to perform these on a daily basis until I  see her next

## 2014-08-18 NOTE — Patient Instructions (Signed)
Hip Exercises RANGE OF MOTION (ROM) AND STRETCHING EXERCISES  These exercises may help you when beginning to rehabilitate your injury. Doing them too aggressively can worsen your condition. Complete them slowly and gently. Your symptoms may resolve with or without further involvement from your physician, physical therapist or athletic trainer. While completing these exercises, remember:   Restoring tissue flexibility helps normal motion to return to the joints. This allows healthier, less painful movement and activity.  An effective stretch should be held for at least 30 seconds.  A stretch should never be painful. You should only feel a gentle lengthening or release in the stretched tissue. If these stretches worsen your symptoms even when done gently, consult your physician, physical therapist or athletic trainer. STRETCH - Hamstrings, Supine   Lie on your back. Loop a belt or towel over the ball of your right / left foot.  Straighten your right / left knee and slowly pull on the belt to raise your leg. Do not allow the right / left knee to bend. Keep your opposite leg flat on the floor.  Raise the leg until you feel a gentle stretch behind your right / left knee or thigh. Hold this position for __________ seconds. Repeat __________ times. Complete this stretch __________ times per day.  STRETCH - Hip Rotators   Lie on your back on a firm surface. Grasp your right / left knee with your right / left hand and your ankle with your opposite hand.  Keeping your hips and shoulders firmly planted, gently pull your right / left knee and rotate your lower leg toward your opposite shoulder until you feel a stretch in your buttocks.  Hold this stretch for __________ seconds. Repeat this stretch __________ times. Complete this stretch __________ times per day. STRETCH - Hamstrings/Adductors, V-Sit   Sit on the floor with your legs extended in a large "V," keeping your knees straight.  With your  head and chest upright, bend at your waist reaching for your right foot to stretch your left adductors.  You should feel a stretch in your left inner thigh. Hold for __________ seconds.  Return to the upright position to relax your leg muscles.  Continuing to keep your chest upright, bend straight forward at your waist to stretch your hamstrings.  You should feel a stretch behind both of your thighs and/or knees. Hold for __________ seconds.  Return to the upright position to relax your leg muscles.  Repeat steps 2 through 4 for opposite leg. Repeat __________ times. Complete this exercise __________ times per day.  STRETCHING - Hip Flexors, Lunge  Half kneel with your right / left knee on the floor and your opposite knee bent and directly over your ankle.  Keep good posture with your head over your shoulders. Tighten your buttocks to point your tailbone downward; this will prevent your back from arching too much.  You should feel a gentle stretch in the front of your thigh and/or hip. If you do not feel any resistance, slightly slide your opposite foot forward and then slowly lunge forward so your knee once again lines up over your ankle. Be sure your tailbone remains pointed downward.  Hold this stretch for __________ seconds. Repeat __________ times. Complete this stretch __________ times per day. STRENGTHENING EXERCISES These exercises may help you when beginning to rehabilitate your injury. They may resolve your symptoms with or without further involvement from your physician, physical therapist or athletic trainer. While completing these exercises, remember:   Muscles  can gain both the endurance and the strength needed for everyday activities through controlled exercises.  Complete these exercises as instructed by your physician, physical therapist or athletic trainer. Progress the resistance and repetitions only as guided.  You may experience muscle soreness or fatigue, but the  pain or discomfort you are trying to eliminate should never worsen during these exercises. If this pain does worsen, stop and make certain you are following the directions exactly. If the pain is still present after adjustments, discontinue the exercise until you can discuss the trouble with your clinician. STRENGTH - Hip Extensors, Bridge   Lie on your back on a firm surface. Bend your knees and place your feet flat on the floor.  Tighten your buttocks muscles and lift your bottom off the floor until your trunk is level with your thighs. You should feel the muscles in your buttocks and back of your thighs working. If you do not feel these muscles, slide your feet 1-2 inches further away from your buttocks.  Hold this position for __________ seconds.  Slowly lower your hips to the starting position and allow your buttock muscles relax completely before beginning the next repetition.  If this exercise is too easy, you may cross your arms over your chest. Repeat __________ times. Complete this exercise __________ times per day.  STRENGTH - Hip Abductors, Straight Leg Raises  Be aware of your form throughout the entire exercise so that you exercise the correct muscles. Sloppy form means that you are not strengthening the correct muscles.  Lie on your side so that your head, shoulders, knee and hip line up. You may bend your lower knee to help maintain your balance. Your right / left leg should be on top.  Roll your hips slightly forward, so that your hips are stacked directly over each other and your right / left knee is facing forward.  Lift your top leg up 4-6 inches, leading with your heel. Be sure that your foot does not drift forward or that your knee does not roll toward the ceiling.  Hold this position for __________ seconds. You should feel the muscles in your outer hip lifting (you may not notice this until your leg begins to tire).  Slowly lower your leg to the starting position. Allow  the muscles to fully relax before beginning the next repetition. Repeat __________ times. Complete this exercise __________ times per day.  STRENGTH - Hip Adductors, Straight Leg Raises   Lie on your side so that your head, shoulders, knee and hip line up. You may place your upper foot in front to help maintain your balance. Your right / left leg should be on the bottom.  Roll your hips slightly forward, so that your hips are stacked directly over each other and your right / left knee is facing forward.  Tense the muscles in your inner thigh and lift your bottom leg 4-6 inches. Hold this position for __________ seconds.  Slowly lower your leg to the starting position. Allow the muscles to fully relax before beginning the next repetition. Repeat __________ times. Complete this exercise __________ times per day.  STRENGTH - Quadriceps, Straight Leg Raises  Quality counts! Watch for signs that the quadriceps muscle is working to insure you are strengthening the correct muscles and not "cheating" by substituting with healthier muscles.  Lay on your back with your right / left leg extended and your opposite knee bent.  Tense the muscles in the front of your right / left  thigh. You should see either your knee cap slide up or increased dimpling just above the knee. Your thigh may even quiver.  Tighten these muscles even more and raise your leg 4 to 6 inches off the floor. Hold for right / left seconds.  Keeping these muscles tense, lower your leg.  Relax the muscles slowly and completely in between each repetition. Repeat __________ times. Complete this exercise __________ times per day.  STRENGTH - Hip Abductors, Standing  Tie one end of a rubber exercise band/tubing to a secure surface (table, pole) and tie a loop at the other end.  Place the loop around your right / left ankle. Keeping your ankle with the band directly opposite of the secured end, step away until there is tension in the  tube/band.  Hold onto a chair as needed for balance.  Keeping your back upright, your shoulders over your hips, and your toes pointing forward, lift your right / left leg out to your side. Be sure to lift your leg with your hip muscles. Do not "throw" your leg or tip your body to lift your leg.  Slowly and with control, return to the starting position. Repeat exercise __________ times. Complete this exercise __________ times per day.  STRENGTH - Quadriceps, Squats  Stand in a door frame so that your feet and knees are in line with the frame.  Use your hands for balance, not support, on the frame.  Slowly lower your weight, bending at the hips and knees. Keep your lower legs upright so that they are parallel with the door frame. Squat only within the range that does not increase your knee pain. Never let your hips drop below your knees.  Slowly return upright, pushing with your legs, not pulling with your hands. Document Released: 07/05/2005 Document Revised: 09/09/2011 Document Reviewed: 09/29/2008 [facility] Patient Information 2015 [place], [place]. This information is not intended to replace advice given to you by your health care provider. Make sure you discuss any questions you have with your health care provider.

## 2014-08-20 ENCOUNTER — Telehealth: Payer: Self-pay | Admitting: Physician Assistant

## 2014-08-20 NOTE — Telephone Encounter (Signed)
      Patient recently had LHC through groin. She had two episodes of her hands going numb and white. Now resolved. Sx c/w raynauds phenomenon. Reassured her this likely has nothing to do her recent heart cath and to follow up with PCP if she has further issues.  Angelena Form PA-C  [facility]

## 2014-08-27 ENCOUNTER — Other Ambulatory Visit: Payer: Self-pay | Admitting: Cardiovascular Disease

## 2014-09-02 ENCOUNTER — Other Ambulatory Visit: Payer: Self-pay | Admitting: Cardiovascular Disease

## 2014-09-15 ENCOUNTER — Ambulatory Visit (HOSPITAL_BASED_OUTPATIENT_CLINIC_OR_DEPARTMENT_OTHER): Payer: Federal, State, Local not specified - PPO | Admitting: Physical Medicine & Rehabilitation

## 2014-09-15 ENCOUNTER — Encounter: Payer: Federal, State, Local not specified - PPO | Attending: Physical Medicine & Rehabilitation

## 2014-09-15 ENCOUNTER — Encounter: Payer: Self-pay | Admitting: Physical Medicine & Rehabilitation

## 2014-09-15 VITALS — BP 156/58 | HR 86 | Resp 14

## 2014-09-15 DIAGNOSIS — Z5181 Encounter for therapeutic drug level monitoring: Secondary | ICD-10-CM | POA: Insufficient documentation

## 2014-09-15 DIAGNOSIS — G894 Chronic pain syndrome: Secondary | ICD-10-CM | POA: Insufficient documentation

## 2014-09-15 DIAGNOSIS — Z79899 Other long term (current) drug therapy: Secondary | ICD-10-CM | POA: Diagnosis not present

## 2014-09-15 DIAGNOSIS — M7061 Trochanteric bursitis, right hip: Secondary | ICD-10-CM

## 2014-09-15 NOTE — Progress Notes (Signed)
Trochanteric bursa injection With  ultrasound guidance  Indication Trochanteric bursitis. Exam has tenderness over the greater trochanter of the hip. Pain has not responded to conservative care such as exercise therapy and oral medications. Pain interferes with sleep or with mobility,  Informed consent was obtained after describing risks and benefits of the procedure with the patient these include bleeding bruising and infection. Patient has signed written consent form. Patient placed in a lateral decubitus position with the affected hip superior. Point of maximal pain was palpated marked and prepped with Betadine and entered with a needle to bone contact,posterior facet targeted. Needle slightly withdrawn then 6mg  of betamethasone with 4 cc 1% lidocaine were injected. Patient tolerated procedure well. Post procedure instructions given.

## 2014-10-14 ENCOUNTER — Other Ambulatory Visit: Payer: Self-pay | Admitting: Adult Health

## 2014-10-17 ENCOUNTER — Other Ambulatory Visit: Payer: Self-pay | Admitting: Adult Health

## 2014-10-24 ENCOUNTER — Other Ambulatory Visit: Payer: Self-pay | Admitting: Adult Health

## 2014-10-27 ENCOUNTER — Ambulatory Visit (INDEPENDENT_AMBULATORY_CARE_PROVIDER_SITE_OTHER): Payer: Federal, State, Local not specified - PPO | Admitting: *Deleted

## 2014-10-27 DIAGNOSIS — I442 Atrioventricular block, complete: Secondary | ICD-10-CM

## 2014-10-27 NOTE — Progress Notes (Signed)
Remote pacemaker transmission.   

## 2014-10-30 ENCOUNTER — Other Ambulatory Visit: Payer: Self-pay | Admitting: Adult Health

## 2014-11-02 NOTE — Progress Notes (Signed)
Cardiology Office Note   Date:  11/02/2014   ID:  Erin Charles, DOB [birth year], MRN [id number]  PCP:  Gennette Pac, MD  Cardiologist:   Thayer Headings, MD   Chief Complaint  Patient presents with  . Aortic Stenosis   Problem List: 1. Hypertension 2. Aortic stenosis - severe , mean gradient of 42, peak gradient of 69. 3. Chest pain 4. Pre-canerous breast mass 5. Macular degeneration 6. Pacer -St. Jude dual- chamber pacemaker 08/29/10   History of Present Illness: Erin Charles is a 81 y.o. female who presents for evalualtion of some episodes of chest discomfort. Pt complains of CP - only with walking .  Better with rest.  For the past  Several weeks , worse over the past 3 days.   Last for several minutes.   Mid / left upper chest , radiates around to the back .  Not associated with dyspnea, no sweats, no pre-syncope  Nov 03, 2014: Erin Charles had a cardiac cath last month.  She has no significant CAD and has significant Aortic stenosis.   She has not wanted to go for TAVR - is here to discuss.   Past Medical History  Diagnosis Date  . HTN (hypertension)   . Dyslipidemia   . Severe aortic stenosis     s/p echo in 2010 with AVA of .77  . Complete heart block     Pacer  . RBBB (right bundle branch block)   . Advanced age   . OA (osteoarthritis)   . Aortic stenosis   . 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 Macular degeneration     Past Surgical History  Procedure Laterality Date  . Insert / replace / remove pacemaker  08/30/10    DUAL CHAMBER/ST. JUDE  . Pilonidal cyst excision    . Hernia repair    . Eye surgery    . Pacemaker insertion    . Left and right heart catheterization with coronary angiogram N/A 08/09/2014    Procedure: LEFT AND RIGHT HEART CATHETERIZATION WITH CORONARY ANGIOGRAM;  Surgeon: Burnell Blanks, MD;  Location: [facility] CATH LAB;  Service: Cardiovascular;  Laterality: N/A;     Current Outpatient  Prescriptions  Medication Sig Dispense Refill  . acetaminophen-codeine (TYLENOL #3) 300-30 MG per tablet TAKE 1 TABLET BY MOUTH EVERY 8 HOURS AS NEEDED FOR MODERATE PAIN 90 tablet 2  . ALPRAZolam (XANAX) 0.25 MG tablet Take 0.25 mg by mouth every 8 (eight) hours as needed. For anxiety    . anastrozole (ARIMIDEX) 1 MG tablet TAKE 1 TABLET (1 MG TOTAL) BY MOUTH DAILY. 30 tablet 5  . aspirin 81 MG tablet Take 81 mg by mouth daily.    . clindamycin (CLEOCIN) 150 MG capsule Take 150 mg by mouth. 4 capsules 1 hour prior to dental work  2  . ELIQUIS 2.5 MG TABS tablet TAKE 1 TABLET BY MOUTH TWICE A DAY 60 tablet 4  . hydrochlorothiazide 25 MG tablet Take 25 mg by mouth daily.     Marland Kitchen IRON-VITAMIN C PO Take 1 tablet by mouth daily.     Marland Kitchen KLOR-CON M10 10 MEQ tablet TAKE 1 TABLET BY MOUTH EVERY DAY 90 tablet 1  . LYRICA 75 MG capsule Take 75 mg by mouth 2 (two) times daily.    . metoprolol succinate (TOPROL-XL) 50 MG 24 hr tablet Take 1 tablet (50 mg total) by mouth daily. 90 tablet 3  . nitroGLYCERIN (NITROSTAT) 0.4 MG  SL tablet Place 1 tablet (0.4 mg total) under the tongue every 5 (five) minutes as needed for chest pain. 25 tablet 6  . nortriptyline (PAMELOR) 10 MG capsule Take 10 mg by mouth at bedtime.    . quinapril (ACCUPRIL) 40 MG tablet Take 40 mg by mouth 2 (two) times daily.    Marland Kitchen senna (SENOKOT) 8.6 MG TABS tablet Take 2 tablets by mouth at bedtime.    . simvastatin (ZOCOR) 40 MG tablet Take 40 mg by mouth at bedtime.     . Vitamin D, Ergocalciferol, (DRISDOL) 50000 UNITS CAPS capsule Take 50,000 Units by mouth every 7 (seven) days.     Marland Kitchen zolpidem (AMBIEN) 10 MG tablet Take 10 mg by mouth at bedtime.     No current facility-administered medications for this visit.    Allergies:   Penicillins; Tolectin; and Macrodantin    Social History:  The patient  reports that she has never smoked. She has never used smokeless tobacco. She reports that she does not drink alcohol or use illicit drugs.    Family History:  The patient's family history includes Cancer in her mother; Heart attack in her sister; Heart failure in her father.    ROS:  Please see the history of present illness.    Review of Systems: Constitutional:  denies fever, chills, diaphoresis, appetite change and fatigue.  HEENT: denies photophobia, eye pain, redness, hearing loss, ear pain, congestion, sore throat, rhinorrhea, sneezing, neck pain, neck stiffness and tinnitus.  Respiratory: denies SOB, DOE, cough, chest tightness, and wheezing.  Cardiovascular: admits to chest pain,   Gastrointestinal: denies nausea, vomiting, abdominal pain, diarrhea, constipation, blood in stool.  Genitourinary: denies dysuria, urgency, frequency, hematuria, flank pain and difficulty urinating.  Musculoskeletal: denies  myalgias, back pain, joint swelling, arthralgias and gait problem.   Skin: denies pallor, rash and wound.  Neurological: denies dizziness, seizures, syncope, weakness, light-headedness, numbness and headaches.   Hematological: denies adenopathy, easy bruising, personal or family bleeding history.  Psychiatric/ Behavioral: denies suicidal ideation, mood changes, confusion, nervousness, sleep disturbance and agitation.       All other systems are reviewed and negative.    PHYSICAL EXAM: VS:  There were no vitals taken for this visit. , BMI There is no weight on file to calculate BMI. GEN: Well nourished, well developed, in no acute distress, elderly  HEENT: normal Neck: no JVD,  Has bilateral carotid bruits,  Cardiac: RRR; 3/6 systolic  murmur, rubs, or gallops,no edema  Respiratory:  clear to auscultation bilaterally, normal work of breathing GI: soft, nontender, nondistended, + BS MS: no deformity or atrophy Skin: warm and dry, no rash Neuro:  Strength and sensation are intact Psych: normal   EKG:  EKG is ordered today. The ekg ordered today demonstrates AV pacing .    Recent Labs: 08/05/2014: ALT 10;  BUN 18; Creatinine 0.87; Potassium 4.1; Sodium 136 08/09/2014: Hemoglobin 11.0*; Platelets 188    Lipid Panel    Component Value Date/Time   CHOL 87 08/04/2011 0610   TRIG 157* 08/04/2011 0610   HDL 28* 08/04/2011 0610   CHOLHDL 3.1 08/04/2011 0610   VLDL 31 08/04/2011 0610   LDLCALC 28 08/04/2011 0610      Wt Readings from Last 3 Encounters:  08/09/14 127 lb (57.607 kg)  08/05/14 127 lb 12.8 oz (57.97 kg)  05/20/14 124 lb (56.246 kg)      Other studies Reviewed: Additional studies/ records that were reviewed today include: . Review of the above  records demonstrates:    ASSESSMENT AND PLAN:   1. Hypertension - BP is well controlled  2. Aortic stenosis - severe , mean gradient of 42, peak gradient of 69. - She has a history of moderate to severe aortic stenosis for many years. It's possible that her current episodes of chest pain are related to her aortic stenosis.  3. Chest pain -Ms.  Charles presents with several weeks of progressive chest pain.. She now has exertional chest pain several times a day. Her pain is consistent with either coronary artery disease or perhaps pain related to her aortic stenosis.   Given her symptoms we will set her up for cardiac catheterization. We'll give her a prescription for nitroglycerin which she is to take if the pain comes very severe over the weekend.   4. Pre-canerous breast mass 5. Macular degeneration 6. Pacer -St. Jude dual- chamber pacemaker 08/29/10  Current medicines are reviewed at length with the patient today.  The patient does not have concerns regarding medicines.  The following changes have been made:  no change  Disposition:   FU with me in 3 months     Signed, Dinesha Twiggs, Wonda Cheng, MD  11/02/2014 9:40 PM    [address], [place], [place]  [address] Phone: [phone]; Fax: [phone]

## 2014-11-03 ENCOUNTER — Encounter: Payer: Self-pay | Admitting: Cardiovascular Disease

## 2014-11-03 ENCOUNTER — Ambulatory Visit (INDEPENDENT_AMBULATORY_CARE_PROVIDER_SITE_OTHER): Payer: Federal, State, Local not specified - PPO | Admitting: Cardiovascular Disease

## 2014-11-03 VITALS — BP 140/60 | HR 60 | Ht <= 58 in | Wt 130.4 lb

## 2014-11-03 DIAGNOSIS — I350 Nonrheumatic aortic (valve) stenosis: Secondary | ICD-10-CM | POA: Diagnosis not present

## 2014-11-03 NOTE — Patient Instructions (Signed)
Medication Instructions:  Your physician recommends that you continue on your current medications as directed. Please refer to the Current Medication list given to you today.   Labwork: None  Testing/Procedures: None  Follow-Up: Your physician wants you to follow-up in: 6 months with Dr. Nahser.  You will receive a reminder letter in the mail two months in advance. If you don't receive a letter, please call our office to schedule the follow-up appointment.      

## 2014-11-04 LAB — CUP PACEART REMOTE DEVICE CHECK
Battery Remaining Longevity: 61 mo
Brady Statistic AP VS Percent: 1 %
Brady Statistic AS VS Percent: 1 %
Brady Statistic RV Percent Paced: 99 %
Date Time Interrogation Session: 20160428061305
Lead Channel Impedance Value: 550 Ohm
Lead Channel Pacing Threshold Amplitude: 1 V
Lead Channel Pacing Threshold Amplitude: 1.5 V
Lead Channel Pacing Threshold Pulse Width: 0.5 ms
Lead Channel Pacing Threshold Pulse Width: 0.5 ms
Lead Channel Setting Pacing Amplitude: 1.75 V
Lead Channel Setting Pacing Amplitude: 2 V
Lead Channel Setting Pacing Pulse Width: 0.5 ms
Lead Channel Setting Sensing Sensitivity: 1.5 mV
MDC IDC MSMT BATTERY REMAINING PERCENTAGE: 56 %
MDC IDC MSMT BATTERY VOLTAGE: 2.92 V
MDC IDC MSMT LEADCHNL RA SENSING INTR AMPL: 1.4 mV
MDC IDC MSMT LEADCHNL RV IMPEDANCE VALUE: 300 Ohm
MDC IDC MSMT LEADCHNL RV SENSING INTR AMPL: 8.4 mV
MDC IDC PG SERIAL: 7210946
MDC IDC STAT BRADY AP VP PERCENT: 13 %
MDC IDC STAT BRADY AS VP PERCENT: 87 %
MDC IDC STAT BRADY RA PERCENT PACED: 12 %

## 2014-11-10 ENCOUNTER — Encounter: Payer: Self-pay | Admitting: Cardiology

## 2014-11-11 ENCOUNTER — Encounter: Payer: Federal, State, Local not specified - PPO | Attending: Physical Medicine & Rehabilitation

## 2014-11-11 ENCOUNTER — Encounter: Payer: Self-pay | Admitting: Physical Medicine & Rehabilitation

## 2014-11-11 ENCOUNTER — Ambulatory Visit (HOSPITAL_BASED_OUTPATIENT_CLINIC_OR_DEPARTMENT_OTHER): Payer: Federal, State, Local not specified - PPO | Admitting: Physical Medicine & Rehabilitation

## 2014-11-11 VITALS — BP 151/66 | HR 78 | Resp 16

## 2014-11-11 DIAGNOSIS — Z79899 Other long term (current) drug therapy: Secondary | ICD-10-CM | POA: Diagnosis not present

## 2014-11-11 DIAGNOSIS — G894 Chronic pain syndrome: Secondary | ICD-10-CM | POA: Diagnosis not present

## 2014-11-11 DIAGNOSIS — M7061 Trochanteric bursitis, right hip: Secondary | ICD-10-CM | POA: Diagnosis not present

## 2014-11-11 DIAGNOSIS — M4806 Spinal stenosis, lumbar region: Secondary | ICD-10-CM | POA: Diagnosis not present

## 2014-11-11 DIAGNOSIS — Z5181 Encounter for therapeutic drug level monitoring: Secondary | ICD-10-CM | POA: Insufficient documentation

## 2014-11-11 DIAGNOSIS — M48061 Spinal stenosis, lumbar region without neurogenic claudication: Secondary | ICD-10-CM

## 2014-11-11 DIAGNOSIS — I200 Unstable angina: Secondary | ICD-10-CM

## 2014-11-11 MED ORDER — ACETAMINOPHEN-CODEINE #3 300-30 MG PO TABS: 1.0000 | ORAL_TABLET | Freq: Three times a day (TID) | ORAL | Status: DC | PRN

## 2014-11-11 NOTE — Progress Notes (Signed)
Subjective:    Patient ID: Erin Charles, female    DOB: 12-Sep-1922, 81 y.o.   MRN: [id number]  HPI Has not signed CSA--Done today.  Did not bring bottle but says she did not know to bring to each visit. Reviewed narcotic expectations.  Pain Inventory Average Pain 6 Pain Right Now 6 My pain is aching  In the last 24 hours, has pain interfered with the following? General activity 4 Relation with others 10 Enjoyment of life 10 What TIME of day is your pain at its worst? evening Sleep (in general) Good  Pain is worse with: bending Pain improves with: heat/ice and injections Relief from Meds: no answer  Mobility use a cane ability to climb steps?  no do you drive?  no  Function I need assistance with the following:  dressing, bathing, meal prep, household duties and shopping  Neuro/Psych trouble walking  Prior Studies Any changes since last visit?  no  Physicians involved in your care Any changes since last visit?  no   Family History  Problem Relation Age of Onset  . Cancer Mother     Bladder cancer  . Heart failure Father   . Heart attack Sister    History   Social History  . Marital Status: Married    Spouse Name: N/A  . Number of Children: N/A  . Years of Education: N/A   Social History Main Topics  . Smoking status: Never Smoker   . Smokeless tobacco: Never Used  . Alcohol Use: No  . Drug Use: No  . Sexual Activity: Not Currently    Birth Control/ Protection: Post-menopausal   Other Topics Concern  . None   Social History Narrative   Past Surgical History  Procedure Laterality Date  . Insert / replace / remove pacemaker  08/30/10    DUAL CHAMBER/ST. JUDE  . Pilonidal cyst excision    . Hernia repair    . Eye surgery    . Pacemaker insertion    . Left and right heart catheterization with coronary angiogram N/A 08/09/2014    Procedure: LEFT AND RIGHT HEART CATHETERIZATION WITH CORONARY ANGIOGRAM;  Surgeon: Burnell Blanks, MD;   Location: [facility] CATH LAB;  Service: Cardiovascular;  Laterality: N/A;   Past Medical History  Diagnosis Date  . HTN (hypertension)   . Dyslipidemia   . Severe aortic stenosis     s/p echo in 2010 with AVA of .77  . Complete heart block     Pacer  . RBBB (right bundle branch block)   . Advanced age   . OA (osteoarthritis)   . Aortic stenosis   . 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 Macular degeneration    BP 151/66 mmHg  Pulse 78  Resp 16  SpO2 97%  Opioid Risk Score:   Fall Risk Score: Moderate Fall Risk (6-13 points)`1  Depression screen PHQ 2/9  Depression screen PHQ 2/9 09/15/2014  Decreased Interest 0  Down, Depressed, Hopeless 0  PHQ - 2 Score 0  Altered sleeping 0  Tired, decreased energy 1  Change in appetite 0  Feeling bad or failure about yourself  0  Trouble concentrating 0  Moving slowly or fidgety/restless 0  Suicidal thoughts 0  PHQ-9 Score 1      Review of Systems  Musculoskeletal: Positive for gait problem.       Hip pain  All other systems reviewed and are negative.      Objective:  Physical Exam        Assessment & Plan:

## 2014-11-11 NOTE — Patient Instructions (Signed)
Joint Injection  Care After  Refer to this sheet in the next few days. These instructions provide you with information on caring for yourself after you have had a joint injection. Your caregiver also may give you more specific instructions. Your treatment has been planned according to current medical practices, but problems sometimes occur. Call your caregiver if you have any problems or questions after your procedure.  After any type of joint injection, it is not uncommon to experience:  · Soreness, swelling, or bruising around the injection site.  · Mild numbness, tingling, or weakness around the injection site caused by the numbing medicine used before or with the injection.  It also is possible to experience the following effects associated with the specific agent after injection:  · Iodine-based contrast agents:  ¨ Allergic reaction (itching, hives, widespread redness, and swelling beyond the injection site).  · Corticosteroids (These effects are rare.):  ¨ Allergic reaction.  ¨ Increased blood sugar levels (If you have diabetes and you notice that your blood sugar levels have increased, notify your caregiver).  ¨ Increased blood pressure levels.  ¨ Mood swings.  · Hyaluronic acid in the use of viscosupplementation.  ¨ Temporary heat or redness.  ¨ Temporary rash and itching.  ¨ Increased fluid accumulation in the injected joint.  These effects all should resolve within a day after your procedure.   HOME CARE INSTRUCTIONS  · Limit yourself to light activity the day of your procedure. Avoid lifting heavy objects, bending, stooping, or twisting.  · Take prescription or over-the-counter pain medication as directed by your caregiver.  · You may apply ice to your injection site to reduce pain and swelling the day of your procedure. Ice may be applied 03-04 times:  ¨ Put ice in a plastic bag.  ¨ Place a towel between your skin and the bag.  ¨ Leave the ice on for no longer than 15-20 minutes each time.  SEEK  IMMEDIATE MEDICAL CARE IF:   · Pain and swelling get worse rather than better or extend beyond the injection site.  · Numbness does not go away.  · Blood or fluid continues to leak from the injection site.  · You have chest pain.  · You have swelling of your face or tongue.  · You have trouble breathing or you become dizzy.  · You develop a fever, chills, or severe tenderness at the injection site that last longer than 1 day.  MAKE SURE YOU:  · Understand these instructions.  · Watch your condition.  · Get help right away if you are not doing well or if you get worse.  Document Released: 02/28/2011 Document Revised: 09/09/2011 Document Reviewed: 02/28/2011  ExitCare® Patient Information ©2015 [employer]. This information is not intended to replace advice given to you by your health care provider. Make sure you discuss any questions you have with your health care provider.

## 2014-11-11 NOTE — Progress Notes (Signed)
   Subjective:    Patient ID: Erin Charles, female    DOB: 02-May-1923, 81 y.o.   MRN: [id number]  HPI    Review of Systems     Objective:   Physical Exam        Assessment & Plan:  Trochanteric bursa injection Without  ultrasound guidance  Indication Trochanteric bursitis. Exam has tenderness over the greater trochanter of the hip. Pain has not responded to conservative care such as exercise therapy and oral medications. Pain interferes with sleep or with mobility,  Informed consent was obtained after describing risks and benefits of the procedure with the patient these include bleeding bruising and infection. Patient has signed written consent form. Patient placed in a lateral decubitus position with the affected hip superior. Point of maximal pain was palpated marked and prepped with Betadine and entered with a needle to bone contact,. Needle slightly withdrawn then 6mg  of betamethasone with 4 cc 1% lidocaine were injected. Patient tolerated procedure well. Post procedure instructions given.

## 2014-11-15 ENCOUNTER — Encounter: Payer: Self-pay | Admitting: Internal Medicine

## 2014-11-16 ENCOUNTER — Other Ambulatory Visit: Payer: Self-pay | Admitting: *Deleted

## 2014-11-16 DIAGNOSIS — D0590 Unspecified type of carcinoma in situ of unspecified breast: Secondary | ICD-10-CM

## 2014-11-17 ENCOUNTER — Telehealth: Payer: Self-pay | Admitting: Hematology

## 2014-11-17 ENCOUNTER — Ambulatory Visit (HOSPITAL_BASED_OUTPATIENT_CLINIC_OR_DEPARTMENT_OTHER): Payer: Federal, State, Local not specified - PPO | Admitting: Hematology

## 2014-11-17 ENCOUNTER — Other Ambulatory Visit (HOSPITAL_BASED_OUTPATIENT_CLINIC_OR_DEPARTMENT_OTHER): Payer: Federal, State, Local not specified - PPO

## 2014-11-17 ENCOUNTER — Encounter: Payer: Self-pay | Admitting: Hematology

## 2014-11-17 VITALS — BP 130/39 | HR 60 | Temp 97.7°F | Resp 17 | Ht <= 58 in | Wt 131.3 lb

## 2014-11-17 DIAGNOSIS — D0512 Intraductal carcinoma in situ of left breast: Secondary | ICD-10-CM | POA: Diagnosis not present

## 2014-11-17 DIAGNOSIS — Z79811 Long term (current) use of aromatase inhibitors: Secondary | ICD-10-CM | POA: Diagnosis not present

## 2014-11-17 DIAGNOSIS — D0592 Unspecified type of carcinoma in situ of left breast: Secondary | ICD-10-CM

## 2014-11-17 DIAGNOSIS — D0590 Unspecified type of carcinoma in situ of unspecified breast: Secondary | ICD-10-CM

## 2014-11-17 LAB — CBC WITH DIFFERENTIAL/PLATELET
BASO%: 0.7 % (ref 0.0–2.0)
BASOS ABS: 0.1 10*3/uL (ref 0.0–0.1)
EOS%: 3.9 % (ref 0.0–7.0)
Eosinophils Absolute: 0.3 10*3/uL (ref 0.0–0.5)
HEMATOCRIT: 36.6 % (ref 34.8–46.6)
HEMOGLOBIN: 12 g/dL (ref 11.6–15.9)
LYMPH%: 16.7 % (ref 14.0–49.7)
MCH: 26.1 pg (ref 25.1–34.0)
MCHC: 32.8 g/dL (ref 31.5–36.0)
MCV: 79.8 fL (ref 79.5–101.0)
MONO#: 0.9 10*3/uL (ref 0.1–0.9)
MONO%: 10.9 % (ref 0.0–14.0)
NEUT#: 5.3 10*3/uL (ref 1.5–6.5)
NEUT%: 67.8 % (ref 38.4–76.8)
Platelets: 259 10*3/uL (ref 145–400)
RBC: 4.58 10*6/uL (ref 3.70–5.45)
RDW: 17 % — ABNORMAL HIGH (ref 11.2–14.5)
WBC: 7.9 10*3/uL (ref 3.9–10.3)
lymph#: 1.3 10*3/uL (ref 0.9–3.3)

## 2014-11-17 LAB — COMPREHENSIVE METABOLIC PANEL (CC13)
ALK PHOS: 129 U/L (ref 40–150)
ALT: 12 U/L (ref 0–55)
AST: 21 U/L (ref 5–34)
Albumin: 3.7 g/dL (ref 3.5–5.0)
Anion Gap: 8 mEq/L (ref 3–11)
BUN: 21.1 mg/dL (ref 7.0–26.0)
CO2: 28 mEq/L (ref 22–29)
CREATININE: 0.9 mg/dL (ref 0.6–1.1)
Calcium: 9.2 mg/dL (ref 8.4–10.4)
Chloride: 100 mEq/L (ref 98–109)
EGFR: 55 mL/min/{1.73_m2} — ABNORMAL LOW (ref >90)
Glucose: 88 mg/dl (ref 70–140)
Potassium: 4.3 mEq/L (ref 3.5–5.1)
Sodium: 136 mEq/L (ref 136–145)
Total Bilirubin: 0.38 mg/dL (ref 0.20–1.20)
Total Protein: 6.1 g/dL — ABNORMAL LOW (ref 6.4–8.3)

## 2014-11-17 MED ORDER — ANASTROZOLE 1 MG PO TABS: ORAL_TABLET | ORAL | Status: DC

## 2014-11-17 NOTE — Telephone Encounter (Signed)
Gave adn printed appt sched and avs for pt for June and NOV

## 2014-11-17 NOTE — Progress Notes (Signed)
[facility]  [phone] [phone]  OFFICE PROGRESS NOTE  PATIENT: Erin Charles   DOB: 23-Sep-1922  MR#: [id number]  HEN#:[id number]   REFERRAL :Gennette Pac, MD Bobbye Charleston, MD   CHIEF COMPLAINT: DCIS   PRIOR ONCOLOGIC HISTORY: 80 year old [place] woman with a history of left breast DCIS with comedonecrosis and calcifications diagnosed in 10/2011.  1.  Status post left breast needle core biopsy of the upper outer quadrant on 11/20/2011 which showed high-grade DCIS with comedonecrosis and calcifications, ER 100%, PR 54%, which measured 1.6 x 1.2 x 1.5 mm.    The patient has a history of using hormone replacement therapy for over 40 years with Premarin and Provera prior to DCIS diagnosis.  Patient has been on anastrozole since 09/2011. She was not a candidate for lumpectomy with her aortic stenosis history.    INTERVAL HISTORY: [name] is here for follow up of her DCIS in her left breast.  She continues to take Arimidex.  She is doing well overall. Mild intermittent hot flush, no joint pain or discomfort. She denies any pain, abdominal discomfort, dyspnea or other new symptoms. She lives with her husband at home, able to do self-care and daily activity, goes out for lunch or dinner some time.    PAST MEDICAL HISTORY: Past Medical History  Diagnosis Date  . HTN (hypertension)   . Dyslipidemia   . Severe aortic stenosis     s/p echo in 2010 with AVA of .77  . Complete heart block     Pacer  . RBBB (right bundle branch block)   . Advanced age   . OA (osteoarthritis)   . Aortic stenosis   . 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 Macular degeneration     PAST SURGICAL HISTORY: Past Surgical History  Procedure Laterality Date  . Insert / replace / remove pacemaker  08/30/10    DUAL CHAMBER/ST. JUDE  . Pilonidal cyst excision    . Hernia repair    . Eye surgery    . Pacemaker insertion     . Left and right heart catheterization with coronary angiogram N/A 08/09/2014    Procedure: LEFT AND RIGHT HEART CATHETERIZATION WITH CORONARY ANGIOGRAM;  Surgeon: Burnell Blanks, MD;  Location: [facility] CATH LAB;  Service: Cardiovascular;  Laterality: N/A;    FAMILY HISTORY: Family History  Problem Relation Age of Onset  . Cancer Mother     Bladder cancer  . Heart failure Father   . Heart attack Sister     SOCIAL HISTORY: History  Substance Use Topics  . Smoking status: Never Smoker   . Smokeless tobacco: Never Used  . Alcohol Use: No    ALLERGIES: Allergies  Allergen Reactions  . Penicillins Other (See Comments)    rash  . Tolectin [Tolmetin Sodium] Other (See Comments)    rash  . Macrodantin Other (See Comments)    rash     MEDICATIONS:  Current Outpatient Prescriptions  Medication Sig Dispense Refill  . acetaminophen-codeine (TYLENOL #3) 300-30 MG per tablet Take 1 tablet by mouth every 8 (eight) hours as needed for moderate pain. 90 tablet 2  . ALPRAZolam (XANAX) 0.25 MG tablet Take 0.25 mg by mouth every 8 (eight) hours as needed. For anxiety    . anastrozole (ARIMIDEX) 1 MG tablet TAKE 1 TABLET (1 MG TOTAL) BY MOUTH DAILY. 30 tablet 5  . aspirin 81 MG tablet Take 81 mg by mouth daily.    Marland Kitchen  ELIQUIS 2.5 MG TABS tablet TAKE 1 TABLET BY MOUTH TWICE A DAY 60 tablet 4  . hydrochlorothiazide 25 MG tablet Take 25 mg by mouth daily.     Marland Kitchen IRON-VITAMIN C PO Take 1 tablet by mouth daily.     Marland Kitchen KLOR-CON M10 10 MEQ tablet TAKE 1 TABLET BY MOUTH EVERY DAY 90 tablet 1  . LYRICA 75 MG capsule Take 75 mg by mouth 2 (two) times daily.    . metoprolol succinate (TOPROL-XL) 50 MG 24 hr tablet Take 1 tablet (50 mg total) by mouth daily. 90 tablet 3  . nortriptyline (PAMELOR) 10 MG capsule Take 10 mg by mouth at bedtime.    . quinapril (ACCUPRIL) 40 MG tablet Take 40 mg by mouth 2 (two) times daily.    Marland Kitchen senna (SENOKOT) 8.6 MG TABS tablet Take 2 tablets by mouth at bedtime.    .  simvastatin (ZOCOR) 40 MG tablet Take 40 mg by mouth at bedtime.     . Vitamin D, Ergocalciferol, (DRISDOL) 50000 UNITS CAPS capsule Take 50,000 Units by mouth every 7 (seven) days.     Marland Kitchen zolpidem (AMBIEN) 10 MG tablet Take 10 mg by mouth at bedtime.    . clindamycin (CLEOCIN) 150 MG capsule Take 150 mg by mouth. 4 capsules 1 hour prior to dental work  2  . nitroGLYCERIN (NITROSTAT) 0.4 MG SL tablet Place 1 tablet (0.4 mg total) under the tongue every 5 (five) minutes as needed for chest pain. (Patient not taking: Reported on 11/17/2014) 25 tablet 6   No current facility-administered medications for this visit.      REVIEW OF SYSTEMS: A 10 point review of systems was conducted and is otherwise negative except for what is noted above.    Health Maintenance  Mammogram: 12/07/2013 Bone Density Scan: 12/2011 normal Eye Exam: 05/2014 repeat    PHYSICAL EXAMINATION: BP 130/39 mmHg  Pulse 60  Temp(Src) 97.7 F (36.5 C) (Oral)  Resp 17  Ht 4\' 10"  (1.473 m)  Wt 131 lb 4.8 oz (59.557 kg)  BMI 27.45 kg/m2  SpO2 96%  GENERAL: Patient is a well appearing female in no acute distress HEENT:  Sclerae anicteric.  Oropharynx clear and moist. No ulcerations or evidence of oropharyngeal candidiasis. Neck is supple.  NODES:  No cervical, supraclavicular, or axillary lymphadenopathy palpated.  BREAST EXAM: Breast inspection showed them to be symmetrical with no nipple discharge. Palpation of the breasts and axilla revealed no obvious mass that I could appreciate. LUNGS:  Clear to auscultation bilaterally.  No wheezes or rhonchi. HEART:  + systolic murmur, regular rate, rhythm ABDOMEN:  Soft, nontender.  Positive, normoactive bowel sounds. No organomegaly palpated. MSK:  No focal spinal tenderness to palpation. Full range of motion bilaterally in the upper extremities. EXTREMITIES:  No peripheral edema.   SKIN:  Clear with no obvious rashes or skin changes. No nail dyscrasia. NEURO:  Nonfocal. Well  oriented.  Appropriate affect. ECOG: 1   CBC Latest Ref Rng 11/17/2014 08/09/2014 08/05/2014  WBC 3.9 - 10.3 10e3/uL 7.9 6.1 5.7  Hemoglobin 11.6 - 15.9 g/dL 12.0 11.0(L) 11.9(L)  Hematocrit 34.8 - 46.6 % 36.6 33.9(L) 35.5(L)  Platelets 145 - 400 10e3/uL 259 188 213.0    CMP Latest Ref Rng 11/17/2014 08/05/2014 05/18/2014  Glucose 70 - 140 mg/dl 88 93 88  BUN 7.0 - 26.0 mg/dL 21.1 18 20.4  Creatinine 0.6 - 1.1 mg/dL 0.9 0.87 0.9  Sodium 136 - 145 mEq/L 136 136 138  Potassium  3.5 - 5.1 mEq/L 4.3 4.1 4.5  Chloride 96 - 112 mEq/L - 99 -  CO2 22 - 29 mEq/L 28 31 30(H)  Calcium 8.4 - 10.4 mg/dL 9.2 9.7 9.8  Total Protein 6.4 - 8.3 g/dL 6.1(L) 6.3 6.1(L)  Total Bilirubin 0.20 - 1.20 mg/dL 0.38 0.4 0.33  Alkaline Phos 40 - 150 U/L 129 117 121  AST 5 - 34 U/L 21 22 23   ALT 0 - 55 U/L 12 10 10         RADIOGRAPHIC STUDIES: MAMMOGRAM 12/07/2013 IMPRESSION: Little overall change in size of the previously biopsied group of microcalcifications in the outer left breast, corresponding to known DCIS.   ASSESSMENT: 81 y.o. woman:  1.  Left  breast high-grade DCIS with comedonecrosis and calcifications, ER 100%, PR 54% -she was diagnosed in May 2013, the tumor measured 1.6 x 1.2 x 1.5 mm.    -Patient was not candidate for surgery due to AS -She doing very well clinically. Exam was unremarkable. Her last mammogram in July 2015 showed a stable left breast calcification. -Continue anastrozole indefinitely, she is tolerating well -Repeat mammogram X months -I encouraged her to continue calcium and vitamin D, eat healthy and try to be physically active. -Return to clinic in 6 months.  I spent 20 minutes counseling the patient face to face.  The total time spent in the appointment was 25 minutes.   Truitt Merle  11/17/2014

## 2014-11-21 ENCOUNTER — Ambulatory Visit (INDEPENDENT_AMBULATORY_CARE_PROVIDER_SITE_OTHER): Payer: Federal, State, Local not specified - PPO | Admitting: *Deleted

## 2014-11-21 ENCOUNTER — Telehealth: Payer: Self-pay | Admitting: *Deleted

## 2014-11-21 DIAGNOSIS — D0512 Intraductal carcinoma in situ of left breast: Secondary | ICD-10-CM | POA: Diagnosis not present

## 2014-11-21 NOTE — Telephone Encounter (Signed)
-----   Message from Thayer Headings, MD sent at 11/21/2014 12:49 PM EDT ----- We can dc the asa Thanks  ----- Message -----    From: Margretta Sidle, RN    Sent: 11/21/2014  12:32 PM      To: Thayer Headings, MD  Dr Thurston Hole pt in coumadin clinic today regarding her Eliquis and saw that she is still taking ASA 81mg  daily . Please advise  Thank you Elbert Ewings RN

## 2014-11-21 NOTE — Telephone Encounter (Signed)
Spoke with pt and instructed her to stop Aspirin and pt states understanding

## 2014-11-21 NOTE — Progress Notes (Signed)
Pt was started on Eliquis 2.5mg  bid  for Atrial Fib on October 2014.    Reviewed patients medication list.  Pt is not  currently on any combined P-gp and strong CYP3A4 inhibitors/inducers (ketoconazole, traconazole, ritonavir, carbamazepine, phenytoin, rifampin, St. John's wort).  Reviewed labs.  SCr 0.9 done on 11/17/2014, Weight  60.6 kg,   Dose is appropriate based on age and weight and SrCr.   Hgb 12.0 and HCT 36.6 done on 11/17/2014 A full discussion of the nature of anticoagulants has been carried out.  A benefit/risk analysis has been presented to the patient, so that they understand the justification for choosing anticoagulation with Eliquis at this time.  The need for compliance is stressed.  Pt is aware to take the medication twice daily.  Side effects of potential bleeding are discussed, including unusual colored urine or stools, coughing up blood or coffee ground emesis, nose bleeds or serious fall or head trauma.  Discussed signs and symptoms of stroke. The patient should avoid any OTC items containing aspirin or ibuprofen.  Avoid alcohol consumption.   Call if any signs of abnormal bleeding.  Discussed financial obligations and pt states is not having  any difficulty in obtaining medication.  Next lab test test in 6 months.  Pt states she is not having any bleeding or any sign or symptom of stroke and has not missed any doses of Eliquis. Reviewed Eliquis  pt has just had CBC and BMET 11/17/2014 Pt had injection trochanteric bursa on March 17th and she states this has helped her some Set up appt for pt to be rechecked in 6 months  Sent message to Dr Acie Fredrickson regarding her being  on ASA 81mg  daily Spoke with pt and instructed her to stop ASA 81 mg daily per Dr Elmarie Shiley order and she states understanding.

## 2014-11-21 NOTE — Telephone Encounter (Signed)
Left message to return call to coumadin clinic

## 2014-12-12 ENCOUNTER — Ambulatory Visit
Admission: RE | Admit: 2014-12-12 | Discharge: 2014-12-12 | Disposition: A | Discharge status: Home / Self Care | Payer: Medicare Other | Source: Ambulatory Visit | Attending: Hematology | Admitting: Hematology

## 2014-12-12 ENCOUNTER — Other Ambulatory Visit: Payer: Self-pay | Admitting: Hematology

## 2014-12-12 DIAGNOSIS — D0592 Unspecified type of carcinoma in situ of left breast: Secondary | ICD-10-CM

## 2015-01-22 ENCOUNTER — Other Ambulatory Visit: Payer: Self-pay | Admitting: Physical Medicine & Rehabilitation

## 2015-01-30 ENCOUNTER — Ambulatory Visit (INDEPENDENT_AMBULATORY_CARE_PROVIDER_SITE_OTHER): Payer: Federal, State, Local not specified - PPO | Admitting: *Deleted

## 2015-01-30 DIAGNOSIS — I442 Atrioventricular block, complete: Secondary | ICD-10-CM | POA: Diagnosis not present

## 2015-01-31 NOTE — Progress Notes (Signed)
Remote pacemaker transmission.   

## 2015-02-08 LAB — CUP PACEART REMOTE DEVICE CHECK
Battery Remaining Longevity: 79 mo
Brady Statistic AP VP Percent: 15 %
Brady Statistic AP VS Percent: 1 %
Brady Statistic RA Percent Paced: 13 %
Date Time Interrogation Session: 20160801064946
Lead Channel Impedance Value: 580 Ohm
Lead Channel Pacing Threshold Amplitude: 1 V
Lead Channel Pacing Threshold Amplitude: 1.25 V
Lead Channel Pacing Threshold Pulse Width: 0.5 ms
Lead Channel Pacing Threshold Pulse Width: 0.5 ms
Lead Channel Sensing Intrinsic Amplitude: 5.9 mV
Lead Channel Setting Pacing Amplitude: 1.5 V
Lead Channel Setting Pacing Amplitude: 2 V
Lead Channel Setting Pacing Pulse Width: 0.5 ms
MDC IDC MSMT BATTERY REMAINING PERCENTAGE: 73 %
MDC IDC MSMT BATTERY VOLTAGE: 2.92 V
MDC IDC MSMT LEADCHNL RA SENSING INTR AMPL: 1.8 mV
MDC IDC MSMT LEADCHNL RV IMPEDANCE VALUE: 290 Ohm
MDC IDC PG SERIAL: 7210946
MDC IDC SET LEADCHNL RV SENSING SENSITIVITY: 1.5 mV
MDC IDC STAT BRADY AS VP PERCENT: 85 %
MDC IDC STAT BRADY AS VS PERCENT: 1 %
MDC IDC STAT BRADY RV PERCENT PACED: 99 %
Pulse Gen Model: 2210

## 2015-02-10 ENCOUNTER — Other Ambulatory Visit: Payer: Self-pay | Admitting: Physical Medicine & Rehabilitation

## 2015-02-10 ENCOUNTER — Encounter: Payer: Self-pay | Admitting: Physical Medicine & Rehabilitation

## 2015-02-10 ENCOUNTER — Encounter: Payer: Federal, State, Local not specified - PPO | Attending: Physical Medicine & Rehabilitation

## 2015-02-10 ENCOUNTER — Ambulatory Visit (HOSPITAL_BASED_OUTPATIENT_CLINIC_OR_DEPARTMENT_OTHER): Payer: Federal, State, Local not specified - PPO | Admitting: Physical Medicine & Rehabilitation

## 2015-02-10 VITALS — BP 109/66 | HR 60

## 2015-02-10 DIAGNOSIS — M25551 Pain in right hip: Secondary | ICD-10-CM | POA: Diagnosis not present

## 2015-02-10 DIAGNOSIS — Z5181 Encounter for therapeutic drug level monitoring: Secondary | ICD-10-CM

## 2015-02-10 DIAGNOSIS — Z79899 Other long term (current) drug therapy: Secondary | ICD-10-CM | POA: Insufficient documentation

## 2015-02-10 DIAGNOSIS — G894 Chronic pain syndrome: Secondary | ICD-10-CM | POA: Diagnosis not present

## 2015-02-10 DIAGNOSIS — M7061 Trochanteric bursitis, right hip: Secondary | ICD-10-CM | POA: Diagnosis not present

## 2015-02-10 NOTE — Progress Notes (Signed)
Trochanteric bursa injection With  ultrasound guidance  Indication Trochanteric bursitis. Exam has tenderness over the greater trochanter of the hip. Pain has not responded to conservative care such as exercise therapy and oral medications. Pain interferes with sleep or with mobility,  Informed consent was obtained after describing risks and benefits of the procedure with the patient these include bleeding bruising and infection. Patient has signed written consent form. Patient placed in a lateral decubitus position with the affected hip superior. Point of maximal pain was palpated marked and prepped with Betadine and entered with a needle to bone contact,posterior facet targeted. Needle slightly withdrawn then 6mg  of betamethasone with 4 cc 1% lidocaine were injected. Patient tolerated procedure well. Post procedure instructions given.

## 2015-02-10 NOTE — Patient Instructions (Signed)
Joint Injection  Care After  Refer to this sheet in the next few days. These instructions provide you with information on caring for yourself after you have had a joint injection. Your caregiver also may give you more specific instructions. Your treatment has been planned according to current medical practices, but problems sometimes occur. Call your caregiver if you have any problems or questions after your procedure.  After any type of joint injection, it is not uncommon to experience:  · Soreness, swelling, or bruising around the injection site.  · Mild numbness, tingling, or weakness around the injection site caused by the numbing medicine used before or with the injection.  It also is possible to experience the following effects associated with the specific agent after injection:  · Iodine-based contrast agents:  ¨ Allergic reaction (itching, hives, widespread redness, and swelling beyond the injection site).  · Corticosteroids (These effects are rare.):  ¨ Allergic reaction.  ¨ Increased blood sugar levels (If you have diabetes and you notice that your blood sugar levels have increased, notify your caregiver).  ¨ Increased blood pressure levels.  ¨ Mood swings.  · Hyaluronic acid in the use of viscosupplementation.  ¨ Temporary heat or redness.  ¨ Temporary rash and itching.  ¨ Increased fluid accumulation in the injected joint.  These effects all should resolve within a day after your procedure.   HOME CARE INSTRUCTIONS  · Limit yourself to light activity the day of your procedure. Avoid lifting heavy objects, bending, stooping, or twisting.  · Take prescription or over-the-counter pain medication as directed by your caregiver.  · You may apply ice to your injection site to reduce pain and swelling the day of your procedure. Ice may be applied 03-04 times:  ¨ Put ice in a plastic bag.  ¨ Place a towel between your skin and the bag.  ¨ Leave the ice on for no longer than 15-20 minutes each time.  SEEK  IMMEDIATE MEDICAL CARE IF:   · Pain and swelling get worse rather than better or extend beyond the injection site.  · Numbness does not go away.  · Blood or fluid continues to leak from the injection site.  · You have chest pain.  · You have swelling of your face or tongue.  · You have trouble breathing or you become dizzy.  · You develop a fever, chills, or severe tenderness at the injection site that last longer than 1 day.  MAKE SURE YOU:  · Understand these instructions.  · Watch your condition.  · Get help right away if you are not doing well or if you get worse.  Document Released: 02/28/2011 Document Revised: 09/09/2011 Document Reviewed: 02/28/2011  ExitCare® Patient Information ©2015 [employer]. This information is not intended to replace advice given to you by your health care provider. Make sure you discuss any questions you have with your health care provider.

## 2015-02-11 LAB — PMP ALCOHOL METABOLITE (ETG): ETGU: NEGATIVE ng/mL

## 2015-02-17 ENCOUNTER — Other Ambulatory Visit: Payer: Self-pay | Admitting: Cardiovascular Disease

## 2015-02-17 LAB — PRESCRIPTION MONITORING PROFILE (SOLSTAS)
Amphetamine/Meth: NEGATIVE ng/mL
BENZODIAZEPINE SCREEN, URINE: NEGATIVE ng/mL
Barbiturate Screen, Urine: NEGATIVE ng/mL
Buprenorphine, Urine: NEGATIVE ng/mL
CREATININE, URINE: 92.07 mg/dL (ref >20.0)
Cannabinoid Scrn, Ur: NEGATIVE ng/mL
Carisoprodol, Urine: NEGATIVE ng/mL
Cocaine Metabolites: NEGATIVE ng/mL
FENTANYL URINE: NEGATIVE ng/mL
MDMA URINE: NEGATIVE ng/mL
Meperidine, Ur: NEGATIVE ng/mL
Methadone Screen, Urine: NEGATIVE ng/mL
Nitrites, Initial: NEGATIVE ug/mL
OXYCODONE SCRN UR: NEGATIVE ng/mL
PH URINE, INITIAL: 6 pH (ref 4.5–8.9)
Propoxyphene: NEGATIVE ng/mL
Tapentadol, urine: NEGATIVE ng/mL
Tramadol Scrn, Ur: NEGATIVE ng/mL

## 2015-02-17 LAB — OPIATES/OPIOIDS (LC/MS-MS)
CODEINE URINE: 9725 ng/mL (ref <50)
HYDROMORPHONE: NEGATIVE ng/mL (ref <50)
Hydrocodone: NEGATIVE ng/mL (ref <50)
MORPHINE: 2077 ng/mL (ref <50)
Norhydrocodone, Ur: 162 ng/mL — AB (ref <50)
Noroxycodone, Ur: NEGATIVE ng/mL (ref <50)
OXYCODONE, UR: NEGATIVE ng/mL (ref <50)
OXYMORPHONE, URINE: NEGATIVE ng/mL (ref <50)

## 2015-02-17 LAB — ZOLPIDEM (LC/MS-MS), URINE
Zolpidem (GC/LC/MS), Ur confirm: 43 ng/mL — AB (ref <5)
Zolpidem metabolite (GC/LC/MS) Ur, confirm: 1850 ng/mL — AB (ref <5)

## 2015-02-23 NOTE — Progress Notes (Signed)
Urine drug screen for this encounter is consistent for prescribed medication 

## 2015-03-01 ENCOUNTER — Emergency Department (HOSPITAL_COMMUNITY): Payer: Federal, State, Local not specified - PPO

## 2015-03-01 ENCOUNTER — Encounter: Payer: Self-pay | Admitting: Cardiology

## 2015-03-01 ENCOUNTER — Emergency Department (HOSPITAL_COMMUNITY)
Admission: EM | Admit: 2015-03-01 | Discharge: 2015-03-01 | Disposition: A | Discharge status: Home / Self Care | Payer: Federal, State, Local not specified - PPO | Attending: Emergency Medicine | Admitting: Emergency Medicine

## 2015-03-01 ENCOUNTER — Encounter (HOSPITAL_COMMUNITY): Payer: Self-pay | Admitting: Emergency Medicine

## 2015-03-01 DIAGNOSIS — Z9889 Other specified postprocedural states: Secondary | ICD-10-CM | POA: Insufficient documentation

## 2015-03-01 DIAGNOSIS — I10 Essential (primary) hypertension: Secondary | ICD-10-CM | POA: Diagnosis not present

## 2015-03-01 DIAGNOSIS — M1990 Unspecified osteoarthritis, unspecified site: Secondary | ICD-10-CM | POA: Insufficient documentation

## 2015-03-01 DIAGNOSIS — Z950 Presence of cardiac pacemaker: Secondary | ICD-10-CM | POA: Diagnosis not present

## 2015-03-01 DIAGNOSIS — Z8669 Personal history of other diseases of the nervous system and sense organs: Secondary | ICD-10-CM | POA: Insufficient documentation

## 2015-03-01 DIAGNOSIS — R1013 Epigastric pain: Secondary | ICD-10-CM | POA: Diagnosis present

## 2015-03-01 DIAGNOSIS — E785 Hyperlipidemia, unspecified: Secondary | ICD-10-CM | POA: Insufficient documentation

## 2015-03-01 DIAGNOSIS — Z79899 Other long term (current) drug therapy: Secondary | ICD-10-CM | POA: Diagnosis not present

## 2015-03-01 DIAGNOSIS — R140 Abdominal distension (gaseous): Secondary | ICD-10-CM | POA: Insufficient documentation

## 2015-03-01 DIAGNOSIS — Z880 Allergy status to penicillin: Secondary | ICD-10-CM | POA: Insufficient documentation

## 2015-03-01 DIAGNOSIS — Z86000 Personal history of in-situ neoplasm of breast: Secondary | ICD-10-CM | POA: Insufficient documentation

## 2015-03-01 LAB — URINALYSIS, ROUTINE W REFLEX MICROSCOPIC
BILIRUBIN URINE: NEGATIVE
GLUCOSE, UA: NEGATIVE mg/dL
Ketones, ur: 40 mg/dL — AB
Leukocytes, UA: NEGATIVE
Nitrite: NEGATIVE
PH: 6.5 (ref 5.0–8.0)
Protein, ur: NEGATIVE mg/dL
SPECIFIC GRAVITY, URINE: 1.015 (ref 1.005–1.030)
UROBILINOGEN UA: 0.2 mg/dL (ref 0.0–1.0)

## 2015-03-01 LAB — COMPREHENSIVE METABOLIC PANEL
ALBUMIN: 4.2 g/dL (ref 3.5–5.0)
ALT: 14 U/L (ref 14–54)
AST: 32 U/L (ref 15–41)
Alkaline Phosphatase: 100 U/L (ref 38–126)
Anion gap: 10 (ref 5–15)
BUN: 16 mg/dL (ref 6–20)
CHLORIDE: 96 mmol/L — AB (ref 101–111)
CO2: 28 mmol/L (ref 22–32)
CREATININE: 0.74 mg/dL (ref 0.44–1.00)
Calcium: 9.6 mg/dL (ref 8.9–10.3)
GFR calc non Af Amer: >60 mL/min (ref >60)
GLUCOSE: 124 mg/dL — AB (ref 65–99)
Potassium: 3.6 mmol/L (ref 3.5–5.1)
SODIUM: 134 mmol/L — AB (ref 135–145)
Total Bilirubin: 0.4 mg/dL (ref 0.3–1.2)
Total Protein: 6.7 g/dL (ref 6.5–8.1)

## 2015-03-01 LAB — CBC WITH DIFFERENTIAL/PLATELET
BASOS ABS: 0 10*3/uL (ref 0.0–0.1)
BASOS PCT: 0 % (ref 0–1)
EOS ABS: 0.1 10*3/uL (ref 0.0–0.7)
EOS PCT: 1 % (ref 0–5)
HCT: 36.2 % (ref 36.0–46.0)
HEMOGLOBIN: 12.2 g/dL (ref 12.0–15.0)
Lymphocytes Relative: 12 % (ref 12–46)
Lymphs Abs: 0.8 10*3/uL (ref 0.7–4.0)
MCH: 27.9 pg (ref 26.0–34.0)
MCHC: 33.7 g/dL (ref 30.0–36.0)
MCV: 82.8 fL (ref 78.0–100.0)
Monocytes Absolute: 0.6 10*3/uL (ref 0.1–1.0)
Monocytes Relative: 8 % (ref 3–12)
NEUTROS PCT: 79 % — AB (ref 43–77)
Neutro Abs: 5.2 10*3/uL (ref 1.7–7.7)
PLATELETS: 195 10*3/uL (ref 150–400)
RBC: 4.37 MIL/uL (ref 3.87–5.11)
RDW: 14.9 % (ref 11.5–15.5)
WBC: 6.7 10*3/uL (ref 4.0–10.5)

## 2015-03-01 LAB — URINE MICROSCOPIC-ADD ON

## 2015-03-01 LAB — LIPASE, BLOOD: Lipase: 12 U/L — ABNORMAL LOW (ref 22–51)

## 2015-03-01 LAB — TROPONIN I: TROPONIN I: 0.03 ng/mL (ref <0.031)

## 2015-03-01 MED ORDER — ONDANSETRON 8 MG PO TBDP: 8.0000 mg | ORAL_TABLET | Freq: Three times a day (TID) | ORAL | Status: DC | PRN

## 2015-03-01 MED ORDER — IOHEXOL 300 MG/ML  SOLN
100.0000 mL | Freq: Once | INTRAMUSCULAR | Status: AC | PRN
  Administered 2015-03-01: 80 mL via INTRAVENOUS

## 2015-03-01 MED ORDER — SODIUM CHLORIDE 0.9 % IV SOLN
INTRAVENOUS | Status: DC
  Administered 2015-03-01: 09:00:00 via INTRAVENOUS

## 2015-03-01 MED ORDER — IOHEXOL 300 MG/ML  SOLN
50.0000 mL | Freq: Once | INTRAMUSCULAR | Status: DC | PRN
  Administered 2015-03-01: 50 mL via ORAL
  Filled 2015-03-01: qty 50

## 2015-03-01 MED ORDER — ONDANSETRON HCL 4 MG/2ML IJ SOLN
4.0000 mg | Freq: Once | INTRAMUSCULAR | Status: AC
  Administered 2015-03-01: 4 mg via INTRAVENOUS
  Filled 2015-03-01: qty 2

## 2015-03-01 NOTE — ED Notes (Signed)
MD at bedside. 

## 2015-03-01 NOTE — Discharge Instructions (Signed)

## 2015-03-01 NOTE — ED Notes (Signed)
Pt comes in today with a c/o upper abdominal pain. Pt states this started 2 days ago. Pt has had nausea with no vomiting. Pt also has not had a BM for at least 2 days as well.

## 2015-03-01 NOTE — ED Notes (Signed)
Pt transported to CT with Rad Tech.

## 2015-03-01 NOTE — ED Provider Notes (Signed)
CSN: [id number]     Arrival date & time 03/01/15  0803 History   First MD Initiated Contact with Patient 03/01/15 [id number]     Chief Complaint  Patient presents with  . Abdominal Pain     (Consider location/radiation/quality/duration/timing/severity/associated sxs/prior Treatment) HPI Comments: Patient here with mid epigastric abdominal pain 2 days that has been persistent and associated with nausea no vomiting. Notes constipation and last bowel movement was 3 days ago. Denies any fever or chills. No diarrhea noted. Denies any anginal type symptoms. Took over-the-counter antiacids without relief. No prior history of same. Nothing makes her symptoms better or worse. Denies any urinary symptoms  Patient is a 81 y.o. female presenting with abdominal pain. The history is provided by the patient.  Abdominal Pain   Past Medical History  Diagnosis Date  . HTN (hypertension)   . Dyslipidemia   . Severe aortic stenosis     s/p echo in 2010 with AVA of .77  . Complete heart block     Pacer  . RBBB (right bundle branch block)   . Advanced age   . OA (osteoarthritis)   . Aortic stenosis   . 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 Macular degeneration    Past Surgical History  Procedure Laterality Date  . Insert / replace / remove pacemaker  08/30/10    DUAL CHAMBER/ST. JUDE  . Pilonidal cyst excision    . Hernia repair    . Eye surgery    . Pacemaker insertion    . Left and right heart catheterization with coronary angiogram N/A 08/09/2014    Procedure: LEFT AND RIGHT HEART CATHETERIZATION WITH CORONARY ANGIOGRAM;  Surgeon: Burnell Blanks, MD;  Location: [facility] CATH LAB;  Service: Cardiovascular;  Laterality: N/A;   Family History  Problem Relation Age of Onset  . Cancer Mother     Bladder cancer  . Heart failure Father   . Heart attack Sister    Social History  Substance Use Topics  . Smoking status: Never Smoker   . Smokeless tobacco: Never Used  . Alcohol  Use: No   OB History    No data available     Review of Systems  Gastrointestinal: Positive for abdominal pain.  All other systems reviewed and are negative.     Allergies  Penicillins; Tolectin; and Macrodantin  Home Medications   Prior to Admission medications   Medication Sig Start Date End Date Taking? Authorizing Provider  acetaminophen-codeine (TYLENOL #3) 300-30 MG per tablet TAKE 1 TABLET BY MOUTH EVERY 8 HOURS 01/23/15   Charlett Blake, MD  ALPRAZolam Duanne Moron) 0.25 MG tablet Take 0.25 mg by mouth every 8 (eight) hours as needed. For anxiety    Historical Provider, MD  anastrozole (ARIMIDEX) 1 MG tablet TAKE 1 TABLET (1 MG TOTAL) BY MOUTH DAILY. 11/17/14   Truitt Merle, MD  clindamycin (CLEOCIN) 150 MG capsule Take 150 mg by mouth. 4 capsules 1 hour prior to dental work 07/18/14   Historical Provider, MD  ELIQUIS 2.5 MG TABS tablet TAKE 1 TABLET BY MOUTH TWICE A DAY 10/14/14   Thayer Headings, MD  hydrochlorothiazide 25 MG tablet Take 25 mg by mouth daily.     Thayer Headings, MD  IRON-VITAMIN C PO Take 1 tablet by mouth daily.     Historical Provider, MD  KLOR-CON M10 10 MEQ tablet TAKE 1 TABLET BY MOUTH EVERY DAY 02/17/15   Thayer Headings, MD  LYRICA 75  MG capsule Take 75 mg by mouth 2 (two) times daily. 07/29/14   Historical Provider, MD  metoprolol succinate (TOPROL-XL) 50 MG 24 hr tablet Take 1 tablet (50 mg total) by mouth daily. 04/29/14   Thayer Headings, MD  nitroGLYCERIN (NITROSTAT) 0.4 MG SL tablet Place 1 tablet (0.4 mg total) under the tongue every 5 (five) minutes as needed for chest pain. 08/05/14   Thayer Headings, MD  nortriptyline (PAMELOR) 10 MG capsule Take 10 mg by mouth at bedtime.    Historical Provider, MD  quinapril (ACCUPRIL) 40 MG tablet Take 40 mg by mouth 2 (two) times daily.    Historical Provider, MD  senna (SENOKOT) 8.6 MG TABS tablet Take 2 tablets by mouth at bedtime.    Historical Provider, MD  simvastatin (ZOCOR) 40 MG tablet Take 40 mg by mouth  at bedtime.     Thayer Headings, MD  Vitamin D, Ergocalciferol, (DRISDOL) 50000 UNITS CAPS capsule Take 50,000 Units by mouth every 7 (seven) days.  07/07/13   Historical Provider, MD  zolpidem (AMBIEN) 10 MG tablet Take 10 mg by mouth at bedtime. 09/15/13   Historical Provider, MD   BP 153/48 mmHg  Pulse 63  Temp(Src) 97.8 F (36.6 C) (Oral)  Resp 20  Ht 4\' 11"  (1.499 m)  Wt 130 lb (58.968 kg)  BMI 26.24 kg/m2  SpO2 98% Physical Exam  Constitutional: She is oriented to person, place, and time. She appears well-developed and well-nourished.  Non-toxic appearance. No distress.  HENT:  Head: Normocephalic and atraumatic.  Eyes: Conjunctivae, EOM and lids are normal. Pupils are equal, round, and reactive to light.  Neck: Normal range of motion. Neck supple. No tracheal deviation present. No thyroid mass present.  Cardiovascular: Normal rate, regular rhythm and normal heart sounds.  Exam reveals no gallop.   No murmur heard. Pulmonary/Chest: Effort normal and breath sounds normal. No stridor. No respiratory distress. She has no decreased breath sounds. She has no wheezes. She has no rhonchi. She has no rales.  Abdominal: Soft. Normal appearance and bowel sounds are normal. She exhibits distension. There is tenderness in the epigastric area. There is no rigidity, no rebound, no guarding and no CVA tenderness.    Musculoskeletal: Normal range of motion. She exhibits no edema or tenderness.  Neurological: She is alert and oriented to person, place, and time. She has normal strength. No cranial nerve deficit or sensory deficit. GCS eye subscore is 4. GCS verbal subscore is 5. GCS motor subscore is 6.  Skin: Skin is warm and dry. No abrasion and no rash noted.  Psychiatric: She has a normal mood and affect. Her speech is normal and behavior is normal.  Nursing note and vitals reviewed.   ED Course  Procedures (including critical care time) Labs Review Labs Reviewed  CBC WITH  DIFFERENTIAL/PLATELET  COMPREHENSIVE METABOLIC PANEL  LIPASE, BLOOD  URINALYSIS, ROUTINE W REFLEX MICROSCOPIC (NOT AT [facility])    Imaging Review No results found. I have personally reviewed and evaluated these images and lab results as part of my medical decision-making.   EKG Interpretation None      MDM   Final diagnoses:  None    Patient given meds for nausea here feels better. She's also given IV fluids as well. She feels at her baseline at this time. No signs of obstruction. Repeat abdominal exam at time of discharge remained stable.    Lacretia Leigh, MD 03/01/15 [phone]

## 2015-03-01 NOTE — ED Notes (Signed)
Bed: KN39 Expected date:  Expected time:  Means of arrival:  Comments: EMS- 81yo F, abdominal pain/nausea

## 2015-03-01 NOTE — ED Notes (Signed)
Pt requesting more nausea medicine. Rudene Christians, RN aware

## 2015-03-07 ENCOUNTER — Encounter: Payer: Self-pay | Admitting: Internal Medicine

## 2015-03-28 ENCOUNTER — Other Ambulatory Visit: Payer: Self-pay | Admitting: Physical Medicine & Rehabilitation

## 2015-04-25 ENCOUNTER — Other Ambulatory Visit: Payer: Self-pay | Admitting: Physical Medicine & Rehabilitation

## 2015-04-25 ENCOUNTER — Other Ambulatory Visit: Payer: Self-pay | Admitting: Cardiovascular Disease

## 2015-04-27 ENCOUNTER — Ambulatory Visit (INDEPENDENT_AMBULATORY_CARE_PROVIDER_SITE_OTHER): Payer: Federal, State, Local not specified - PPO | Admitting: Internal Medicine

## 2015-04-27 ENCOUNTER — Encounter: Payer: Self-pay | Admitting: Internal Medicine

## 2015-04-27 VITALS — BP 138/58 | HR 76 | Ht <= 58 in | Wt 136.6 lb

## 2015-04-27 DIAGNOSIS — Z950 Presence of cardiac pacemaker: Secondary | ICD-10-CM

## 2015-04-27 DIAGNOSIS — I350 Nonrheumatic aortic (valve) stenosis: Secondary | ICD-10-CM

## 2015-04-27 DIAGNOSIS — I480 Paroxysmal atrial fibrillation: Secondary | ICD-10-CM

## 2015-04-27 DIAGNOSIS — I200 Unstable angina: Secondary | ICD-10-CM

## 2015-04-27 DIAGNOSIS — I442 Atrioventricular block, complete: Secondary | ICD-10-CM

## 2015-04-27 NOTE — Patient Instructions (Signed)
Medication Instructions:  Your physician recommends that you continue on your current medications as directed. Please refer to the Current Medication list given to you today.  Labwork: None ordered  Testing/Procedures: None ordered  Follow-Up: Remote monitoring is used to monitor your Pacemaker of ICD from home. This monitoring reduces the number of office visits required to check your device to one time per year. It allows us to keep an eye on the functioning of your device to ensure it is working properly. You are scheduled for a device check from home on 07/27/2015. You may send your transmission at any time that day. If you have a wireless device, the transmission will be sent automatically. After your physician reviews your transmission, you will receive a postcard with your next transmission date.  Your physician wants you to follow-up in: 1 year with Dr. Taylor.  You will receive a reminder letter in the mail two months in advance. If you don't receive a letter, please call our office to schedule the follow-up appointment.   Any Other Special Instructions Will Be Listed Below (If Applicable).  If you need a refill on your cardiac medications before your next appointment, please call your pharmacy.  Thank you for choosing [facility]!!         

## 2015-04-27 NOTE — Assessment & Plan Note (Signed)
She is not having any symptoms. Her device interogation demonstrates no recent atrial fib. She will continue her anti-coagulation.

## 2015-04-27 NOTE — Assessment & Plan Note (Signed)
Her valve is tight by exam but she is asymptomatic. Her husband just had a TAVR. She will undergo watchful waiting.

## 2015-04-27 NOTE — Progress Notes (Signed)
PCP: Gennette Pac, MD Primary Cardiologist: Nahser  Erin Charles is a 81 y.o. female who presents today for routine electrophysiology followup.  She has a h/o aortic stenosis which is minimally if at all symptomatic. Since last being seen in our clinic, the patient reports doing very well.  Today, she denies symptoms of palpitations, chest pain, shortness of breath,  lower extremity edema, dizziness, presyncope, or syncope.  She is limited in her activity by her arthritis. The patient is otherwise without complaint today. She has not required hospitalization since her last visit.   Past Medical History  Diagnosis Date  . HTN (hypertension)   . Dyslipidemia   . Severe aortic stenosis     s/p echo in 2010 with AVA of .77  . Complete heart block ([place])     Pacer  . RBBB (right bundle branch block)   . Advanced age   . OA (osteoarthritis)   . Aortic stenosis   . 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 Macular degeneration    Past Surgical History  Procedure Laterality Date  . Insert / replace / remove pacemaker  08/30/10    DUAL CHAMBER/ST. JUDE  . Pilonidal cyst excision    . Hernia repair    . Eye surgery    . Pacemaker insertion    . Left and right heart catheterization with coronary angiogram N/A 08/09/2014    Procedure: LEFT AND RIGHT HEART CATHETERIZATION WITH CORONARY ANGIOGRAM;  Surgeon: Burnell Blanks, MD;  Location: [facility] CATH LAB;  Service: Cardiovascular;  Laterality: N/A;    Current Outpatient Prescriptions  Medication Sig Dispense Refill  . ALPRAZolam (XANAX) 0.25 MG tablet Take 0.25 mg by mouth every 8 (eight) hours as needed. For anxiety      . anastrozole (ARIMIDEX) 1 MG tablet Take 1 mg by mouth daily.      Marland Kitchen aspirin 325 MG tablet Take 325 mg by mouth daily.        . hydrochlorothiazide 25 MG tablet Take 25 mg by mouth daily.       . hydrocodone-acetaminophen (LORCET-HD) 5-500 MG per capsule Take 3 capsules by mouth as needed. For  back pain      . IRON-VITAMIN C PO Take 1 tablet by mouth daily.       Marland Kitchen KLOR-CON M10 10 MEQ tablet TAKE 1 TABLET EVERY DAY  30 tablet  10  . metoprolol succinate (TOPROL-XL) 50 MG 24 hr tablet Take 1 tablet (50 mg total) by mouth daily. Take with or immediately following a meal.  90 tablet  3  . quinapril (ACCUPRIL) 40 MG tablet Take 40 mg by mouth 2 (two) times daily.      . simvastatin (ZOCOR) 40 MG tablet Take 40 mg by mouth at bedtime.       Marland Kitchen zolpidem (AMBIEN) 10 MG tablet Take 10 mg by mouth at bedtime as needed.          Physical Exam: Filed Vitals:   04/27/15 1425  BP: 138/58  Pulse: 76  Height: 4\' 10"  (1.473 m)  Weight: 136 lb 9.6 oz (61.961 kg)    GEN- The patient is well appearing elderly woman, alert and oriented x 3 today.   Head- normocephalic, atraumatic Eyes-  Sclera clear, conjunctiva pink Ears- hearing intact Oropharynx- clear Lungs- Clear to ausculation bilaterally, normal work of breathing Chest- pacemaker pocket is well healed Heart- Regular rate and rhythm,  murmurs, rubs or gallops, PMI not laterally displaced GI-  soft, NT, ND, + BS Extremities- no clubbing, cyanosis, or edema  Pacemaker interrogation- reviewed in detail today,  See PACEART report  Assessment and Plan:

## 2015-04-27 NOTE — Assessment & Plan Note (Signed)
Her St. Jude DDD PM is working normally. Will recheck in several months.  

## 2015-05-02 ENCOUNTER — Encounter: Payer: Self-pay | Admitting: Cardiovascular Disease

## 2015-05-02 ENCOUNTER — Ambulatory Visit (INDEPENDENT_AMBULATORY_CARE_PROVIDER_SITE_OTHER): Payer: Federal, State, Local not specified - PPO | Admitting: Cardiovascular Disease

## 2015-05-02 VITALS — BP 130/60 | HR 76 | Ht <= 58 in | Wt 137.2 lb

## 2015-05-02 DIAGNOSIS — I10 Essential (primary) hypertension: Secondary | ICD-10-CM

## 2015-05-02 DIAGNOSIS — I350 Nonrheumatic aortic (valve) stenosis: Secondary | ICD-10-CM

## 2015-05-02 LAB — CUP PACEART INCLINIC DEVICE CHECK
Battery Remaining Longevity: 75.6
Date Time Interrogation Session: 20161027181828
Implantable Lead Implant Date: 20120301
Implantable Lead Location: 753859
Lead Channel Pacing Threshold Amplitude: 1.5 V
Lead Channel Pacing Threshold Pulse Width: 0.5 ms
Lead Channel Pacing Threshold Pulse Width: 0.5 ms
Lead Channel Sensing Intrinsic Amplitude: 5.9 mV
Lead Channel Setting Pacing Amplitude: 1.75 V
Lead Channel Setting Pacing Amplitude: 2 V
Lead Channel Setting Sensing Sensitivity: 1.5 mV
MDC IDC LEAD IMPLANT DT: 20120301
MDC IDC LEAD LOCATION: 753860
MDC IDC MSMT BATTERY VOLTAGE: 2.92 V
MDC IDC MSMT LEADCHNL RA IMPEDANCE VALUE: 587.5 Ohm
MDC IDC MSMT LEADCHNL RA PACING THRESHOLD AMPLITUDE: 0.75 V
MDC IDC MSMT LEADCHNL RA SENSING INTR AMPL: 0.8 mV
MDC IDC MSMT LEADCHNL RV IMPEDANCE VALUE: 262.5 Ohm
MDC IDC PG SERIAL: 7210946
MDC IDC SET LEADCHNL RV PACING PULSEWIDTH: 0.5 ms
MDC IDC STAT BRADY RA PERCENT PACED: 15 %
MDC IDC STAT BRADY RV PERCENT PACED: 99.58 %

## 2015-05-02 NOTE — Progress Notes (Signed)
Cardiology Office Note   Date:  05/02/2015   ID:  Erin Charles, DOB July 16, 1922, MRN [id number]  PCP:  Gennette Pac, MD  Cardiologist:   Thayer Headings, MD   Chief Complaint  Patient presents with  . Follow-up    aortic stenosis   Problem List: 1. Hypertension 2. Aortic stenosis - severe , mean gradient of 42, peak gradient of 69. 3. Chest pain 4. Pre-canerous breast mass 5. Macular degeneration 6. Pacer -St. Jude dual- chamber pacemaker 08/29/10   History of Present Illness: Erin Charles is a 81 y.o. female who presents for evalualtion of some episodes of chest discomfort. Pt complains of CP - only with walking .  Better with rest.  For the past  Several weeks , worse over the past 3 days.   Last for several minutes.   Mid / left upper chest , radiates around to the back .  Not associated with dyspnea, no sweats, no pre-syncope  Nov 03, 2014: Erin Charles had a cardiac cath last month.  She has no significant CAD and has significant Aortic stenosis.   She has not wanted to go for TAVR - is here to discuss.   Nov. 1, 2016:  Erin Charles is seen today for follow up of aortic stenosis .  No CP or dyspnea.  Walking with a cane.  Limited by her arthritis. .   Past Medical History  Diagnosis Date  . HTN (hypertension)   . Dyslipidemia   . Severe aortic stenosis     s/p echo in 2010 with AVA of .77  . Complete heart block ([place])     Pacer  . RBBB (right bundle branch block)   . Advanced age   . OA (osteoarthritis)   . Aortic stenosis   . 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 Macular degeneration     Past Surgical History  Procedure Laterality Date  . Insert / replace / remove pacemaker  08/30/10    DUAL CHAMBER/ST. JUDE  . Pilonidal cyst excision    . Hernia repair    . Eye surgery    . Pacemaker insertion    . Left and right heart catheterization with coronary angiogram N/A 08/09/2014    Procedure: LEFT AND RIGHT HEART  CATHETERIZATION WITH CORONARY ANGIOGRAM;  Surgeon: Burnell Blanks, MD;  Location: [facility] CATH LAB;  Service: Cardiovascular;  Laterality: N/A;     Current Outpatient Prescriptions  Medication Sig Dispense Refill  . acetaminophen-codeine (TYLENOL #3) 300-30 MG tablet TAKE 1 TABLET BY MOUTH EVERY 8 HOURS AS NEEDED FOR PAIN 90 tablet 2  . ALPRAZolam (XANAX) 0.25 MG tablet Take 0.25 mg by mouth every 8 (eight) hours as needed for anxiety.     Marland Kitchen anastrozole (ARIMIDEX) 1 MG tablet TAKE 1 TABLET (1 MG TOTAL) BY MOUTH DAILY. 30 tablet 5  . clindamycin (CLEOCIN) 150 MG capsule Take by mouth as needed. 4 capsules 1 hour prior to dental work  2  . ELIQUIS 2.5 MG TABS tablet TAKE 1 TABLET BY MOUTH TWICE A DAY 60 tablet 5  . hydrochlorothiazide 25 MG tablet Take 25 mg by mouth daily.     Marland Kitchen IRON-VITAMIN C PO Take 1 tablet by mouth daily.     Marland Kitchen LYRICA 75 MG capsule Take 75 mg by mouth 2 (two) times daily.    . metoprolol succinate (TOPROL-XL) 50 MG 24 hr tablet Take 1 tablet (50 mg total) by mouth daily. 90 tablet 3  .  nitroGLYCERIN (NITROSTAT) 0.4 MG SL tablet Place 1 tablet (0.4 mg total) under the tongue every 5 (five) minutes as needed for chest pain. 25 tablet 6  . nortriptyline (PAMELOR) 10 MG capsule Take 10 mg by mouth at bedtime.    . ondansetron (ZOFRAN ODT) 8 MG disintegrating tablet Take 1 tablet (8 mg total) by mouth every 8 (eight) hours as needed for nausea or vomiting. 20 tablet 0  . potassium chloride (K-DUR,KLOR-CON) 10 MEQ tablet Take 10 mEq by mouth daily.    . quinapril (ACCUPRIL) 40 MG tablet Take 40 mg by mouth 2 (two) times daily.    Marland Kitchen senna (SENOKOT) 8.6 MG TABS tablet Take 2 tablets by mouth at bedtime.    . simvastatin (ZOCOR) 40 MG tablet Take 40 mg by mouth at bedtime.     . Vitamin D, Ergocalciferol, (DRISDOL) 50000 UNITS CAPS capsule Take 50,000 Units by mouth every 7 (seven) days.     Marland Kitchen zolpidem (AMBIEN) 10 MG tablet Take 10 mg by mouth at bedtime.     No current  facility-administered medications for this visit.    Allergies:   Penicillins; Tolectin; and Macrodantin    Social History:  The patient  reports that she has never smoked. She has never used smokeless tobacco. She reports that she does not drink alcohol or use illicit drugs.   Family History:  The patient's family history includes Cancer in her mother; Heart attack in her sister; Heart failure in her father.    ROS:  Please see the history of present illness.    Review of Systems: Constitutional:  denies fever, chills, diaphoresis, appetite change and fatigue.  HEENT: denies photophobia, eye pain, redness, hearing loss, ear pain, congestion, sore throat, rhinorrhea, sneezing, neck pain, neck stiffness and tinnitus.  Respiratory: denies SOB, DOE, cough, chest tightness, and wheezing.  Cardiovascular: admits to chest pain,   Gastrointestinal: denies nausea, vomiting, abdominal pain, diarrhea, constipation, blood in stool.  Genitourinary: denies dysuria, urgency, frequency, hematuria, flank pain and difficulty urinating.  Musculoskeletal: denies  myalgias, back pain, joint swelling, arthralgias and gait problem.   Skin: denies pallor, rash and wound.  Neurological: denies dizziness, seizures, syncope, weakness, light-headedness, numbness and headaches.   Hematological: denies adenopathy, easy bruising, personal or family bleeding history.  Psychiatric/ Behavioral: denies suicidal ideation, mood changes, confusion, nervousness, sleep disturbance and agitation.       All other systems are reviewed and negative.    PHYSICAL EXAM: VS:  BP 130/60 mmHg  Pulse 76  Ht 4\' 10"  (1.473 m)  Wt 137 lb 3.2 oz (62.234 kg)  BMI 28.68 kg/m2  SpO2 97% , BMI Body mass index is 28.68 kg/(m^2). GEN: Well nourished, well developed, in no acute distress, elderly  HEENT: normal Neck: no JVD,  Has bilateral carotid bruits,  Cardiac: RRR; 3/6 systolic  murmur, rubs, or gallops,no edema  Respiratory:   clear to auscultation bilaterally, normal work of breathing GI: soft, nontender, nondistended, + BS MS: no deformity or atrophy Skin: warm and dry, no rash Neuro:  Strength and sensation are intact Psych: normal   EKG:  EKG is ordered today. The ekg ordered today demonstrates AV pacing .    Recent Labs: 03/01/2015: ALT 14; BUN 16; Creatinine, Ser 0.74; Hemoglobin 12.2; Platelets 195; Potassium 3.6; Sodium 134*    Lipid Panel    Component Value Date/Time   CHOL 87 08/04/2011 0610   TRIG 157* 08/04/2011 0610   HDL 28* 08/04/2011 0610   CHOLHDL 3.1  08/04/2011 0610   VLDL 31 08/04/2011 0610   LDLCALC 28 08/04/2011 0610      Wt Readings from Last 3 Encounters:  05/02/15 137 lb 3.2 oz (62.234 kg)  04/27/15 136 lb 9.6 oz (61.961 kg)  03/01/15 130 lb (58.968 kg)      Other studies Reviewed: Additional studies/ records that were reviewed today include: . Review of the above records demonstrates:    ASSESSMENT AND PLAN:   1. Hypertension - BP is well controlled  2. Aortic stenosis - severe , mean gradient of 42, peak gradient of 69. - She has a history of moderate to severe aortic stenosis for many years.  Discussed TAVR.  Will wait and see if she has any symptoms.   she's currently not having any limitations that we can attribute to her aortic stenosis. She's now 81 years old. I suspect that she has mild dementia.   It's quite possible that are very conservative approach would be best for her but we will continue to evaluate her in subsequent  office visits.  3. Chest pain - no recent cp.  Seems to be doing well   4. Pre-canerous breast mass 5. Macular degeneration 6. Pacer -St. Jude dual- chamber pacemaker 08/29/10  Current medicines are reviewed at length with the patient today.  The patient does not have concerns regarding medicines.  The following changes have been made:  no change  Disposition:   FU with me in 3 months     Signed, Tanesha Arambula, Wonda Cheng, MD    05/02/2015 2:55 PM    [address], [place], [place]  [address] Phone: [phone]; Fax: [phone]

## 2015-05-02 NOTE — Patient Instructions (Signed)
Medication Instructions:  Your physician recommends that you continue on your current medications as directed. Please refer to the Current Medication list given to you today.   Labwork: None Ordered   Testing/Procedures: None Ordered   Follow-Up: Your physician recommends that you schedule a follow-up appointment in: 3 months with Dr. Nahser   If you need a refill on your cardiac medications before your next appointment, please call your pharmacy.   Thank you for choosing [facility]! Frank Pilger, RN [phone]    

## 2015-05-04 ENCOUNTER — Ambulatory Visit (HOSPITAL_BASED_OUTPATIENT_CLINIC_OR_DEPARTMENT_OTHER): Payer: Federal, State, Local not specified - PPO | Admitting: Physical Medicine & Rehabilitation

## 2015-05-04 ENCOUNTER — Encounter: Payer: Self-pay | Admitting: Physical Medicine & Rehabilitation

## 2015-05-04 ENCOUNTER — Encounter: Payer: Federal, State, Local not specified - PPO | Attending: Physical Medicine & Rehabilitation

## 2015-05-04 VITALS — BP 137/67 | HR 60 | Resp 14

## 2015-05-04 DIAGNOSIS — Z5181 Encounter for therapeutic drug level monitoring: Secondary | ICD-10-CM | POA: Diagnosis not present

## 2015-05-04 DIAGNOSIS — M7061 Trochanteric bursitis, right hip: Secondary | ICD-10-CM | POA: Diagnosis not present

## 2015-05-04 DIAGNOSIS — G894 Chronic pain syndrome: Secondary | ICD-10-CM | POA: Diagnosis not present

## 2015-05-04 DIAGNOSIS — Z79899 Other long term (current) drug therapy: Secondary | ICD-10-CM | POA: Diagnosis not present

## 2015-05-04 NOTE — Progress Notes (Signed)
Trochanteric bursa injection With  ultrasound guidance  Indication Trochanteric bursitis. Exam has tenderness over the greater trochanter of the hip. Pain has not responded to conservative care such as exercise therapy and oral medications. Pain interferes with sleep or with mobility,  Informed consent was obtained after describing risks and benefits of the procedure with the patient these include bleeding bruising and infection. Patient has signed written consent form. Patient placed in a lateral decubitus position with the affected hip superior. Point of maximal pain was palpated marked and prepped with Betadine and entered with a needle to bone contact,posterior facet targeted. Needle slightly withdrawn then 6mg of betamethasone with 4 cc 1% lidocaine were injected. Patient tolerated procedure well. Post procedure instructions given.  

## 2015-05-04 NOTE — Patient Instructions (Signed)
We did an ultrasound guided trochanteric bursa injection today. This is to relieve right hip pain. We injected Celestone and lidocaine. It may take 1-2 days for the medication to take full effect

## 2015-05-11 ENCOUNTER — Other Ambulatory Visit: Payer: Federal, State, Local not specified - PPO

## 2015-05-11 ENCOUNTER — Encounter: Payer: Federal, State, Local not specified - PPO | Admitting: Hematology

## 2015-05-11 ENCOUNTER — Encounter: Payer: Self-pay | Admitting: Hematology

## 2015-05-11 NOTE — Progress Notes (Signed)
No show  This encounter was created in error - please disregard.

## 2015-05-12 ENCOUNTER — Telehealth: Payer: Self-pay | Admitting: Hematology

## 2015-05-12 NOTE — Telephone Encounter (Signed)
Attempted to contact pt regarding appt phone line busy

## 2015-05-12 NOTE — Telephone Encounter (Signed)
Lvm asking pt to call us to r/s 11/10 missed appt.

## 2015-05-16 ENCOUNTER — Other Ambulatory Visit: Payer: Self-pay | Admitting: Physical Medicine & Rehabilitation

## 2015-06-10 ENCOUNTER — Other Ambulatory Visit: Payer: Self-pay | Admitting: Hematology

## 2015-06-10 ENCOUNTER — Other Ambulatory Visit: Payer: Self-pay | Admitting: Cardiovascular Disease

## 2015-06-12 ENCOUNTER — Other Ambulatory Visit: Payer: Self-pay | Admitting: *Deleted

## 2015-06-20 ENCOUNTER — Other Ambulatory Visit: Payer: Self-pay | Admitting: Physical Medicine & Rehabilitation

## 2015-07-01 ENCOUNTER — Other Ambulatory Visit: Payer: Self-pay | Admitting: Hematology

## 2015-07-05 ENCOUNTER — Other Ambulatory Visit: Payer: Self-pay | Admitting: *Deleted

## 2015-07-05 DIAGNOSIS — D0590 Unspecified type of carcinoma in situ of unspecified breast: Secondary | ICD-10-CM

## 2015-07-05 MED ORDER — ANASTROZOLE 1 MG PO TABS: 1.0000 mg | ORAL_TABLET | Freq: Every day | ORAL | Status: DC

## 2015-07-06 ENCOUNTER — Telehealth: Payer: Self-pay | Admitting: Hematology

## 2015-07-06 NOTE — Telephone Encounter (Signed)
Spoke with patient re lab/YF 1/20 @ 2 pm.

## 2015-07-13 ENCOUNTER — Other Ambulatory Visit: Payer: Self-pay | Admitting: Physical Medicine & Rehabilitation

## 2015-07-21 ENCOUNTER — Encounter: Payer: Self-pay | Admitting: Hematology

## 2015-07-21 ENCOUNTER — Ambulatory Visit (HOSPITAL_BASED_OUTPATIENT_CLINIC_OR_DEPARTMENT_OTHER): Payer: Federal, State, Local not specified - PPO | Admitting: Hematology

## 2015-07-21 ENCOUNTER — Other Ambulatory Visit (HOSPITAL_BASED_OUTPATIENT_CLINIC_OR_DEPARTMENT_OTHER): Payer: Federal, State, Local not specified - PPO

## 2015-07-21 ENCOUNTER — Telehealth: Payer: Self-pay | Admitting: Hematology

## 2015-07-21 VITALS — BP 140/41 | HR 79 | Temp 97.9°F | Resp 18 | Ht <= 58 in | Wt 138.2 lb

## 2015-07-21 DIAGNOSIS — D0512 Intraductal carcinoma in situ of left breast: Secondary | ICD-10-CM

## 2015-07-21 DIAGNOSIS — I10 Essential (primary) hypertension: Secondary | ICD-10-CM

## 2015-07-21 DIAGNOSIS — I350 Nonrheumatic aortic (valve) stenosis: Secondary | ICD-10-CM

## 2015-07-21 DIAGNOSIS — E2839 Other primary ovarian failure: Secondary | ICD-10-CM

## 2015-07-21 DIAGNOSIS — M160 Bilateral primary osteoarthritis of hip: Secondary | ICD-10-CM

## 2015-07-21 DIAGNOSIS — D0592 Unspecified type of carcinoma in situ of left breast: Secondary | ICD-10-CM

## 2015-07-21 LAB — CBC WITH DIFFERENTIAL/PLATELET
BASO%: 0.7 % (ref 0.0–2.0)
BASOS ABS: 0.1 10*3/uL (ref 0.0–0.1)
EOS ABS: 0.5 10*3/uL (ref 0.0–0.5)
EOS%: 5.6 % (ref 0.0–7.0)
HCT: 38.3 % (ref 34.8–46.6)
HEMOGLOBIN: 12.4 g/dL (ref 11.6–15.9)
LYMPH%: 18.5 % (ref 14.0–49.7)
MCH: 26.9 pg (ref 25.1–34.0)
MCHC: 32.5 g/dL (ref 31.5–36.0)
MCV: 83 fL (ref 79.5–101.0)
MONO#: 0.8 10*3/uL (ref 0.1–0.9)
MONO%: 9.4 % (ref 0.0–14.0)
NEUT#: 5.4 10*3/uL (ref 1.5–6.5)
NEUT%: 65.8 % (ref 38.4–76.8)
Platelets: 229 10*3/uL (ref 145–400)
RBC: 4.62 10*6/uL (ref 3.70–5.45)
RDW: 15.4 % — AB (ref 11.2–14.5)
WBC: 8.1 10*3/uL (ref 3.9–10.3)
lymph#: 1.5 10*3/uL (ref 0.9–3.3)

## 2015-07-21 LAB — COMPREHENSIVE METABOLIC PANEL
ALBUMIN: 4.1 g/dL (ref 3.5–5.0)
ALK PHOS: 138 U/L (ref 40–150)
ALT: 10 U/L (ref 0–55)
AST: 22 U/L (ref 5–34)
Anion Gap: 11 mEq/L (ref 3–11)
BUN: 16 mg/dL (ref 7.0–26.0)
CALCIUM: 9.8 mg/dL (ref 8.4–10.4)
CHLORIDE: 99 meq/L (ref 98–109)
CO2: 28 mEq/L (ref 22–29)
Creatinine: 1 mg/dL (ref 0.6–1.1)
EGFR: 52 mL/min/{1.73_m2} — AB (ref >90)
Glucose: 146 mg/dl — ABNORMAL HIGH (ref 70–140)
POTASSIUM: 4.2 meq/L (ref 3.5–5.1)
SODIUM: 138 meq/L (ref 136–145)
Total Bilirubin: 0.4 mg/dL (ref 0.20–1.20)
Total Protein: 6.8 g/dL (ref 6.4–8.3)

## 2015-07-21 NOTE — Progress Notes (Signed)
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  1.  Status post left breast needle core biopsy of the upper outer quadrant on 11/20/2011 which showed high-grade DCIS with comedonecrosis and calcifications, ER 100%, PR 54%, which measured 1.6 x 1.2 x 1.5 mm.    The patient has a history of using hormone replacement therapy for over 40 years with Premarin and Provera prior to DCIS diagnosis.  Patient has been on anastrozole since 09/2011. She was not a candidate for lumpectomy with her aortic stenosis history.    INTERVAL HISTORY: Erin Charles is here for follow up of her DCIS in her left breast.  She is accompanied by her husband to clinic today. She is compliant with Arimidex, takes every day. She is tolerating well , no significant hot flash positive side effects. She has chronic arthritis, especially right hip, she uses a cane. She is not very physically active, but able to take herself and tolerating routine activities. No other new complaints.   PAST MEDICAL HISTORY: Past Medical History  Diagnosis Date  . HTN (hypertension)   . Dyslipidemia   . Severe aortic stenosis     s/p echo in 2010 with AVA of .77  . Complete heart block ([place])     Pacer  . RBBB (right bundle branch block)   . Advanced age   . OA (osteoarthritis)   . Aortic stenosis   . 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 Macular degeneration     PAST SURGICAL HISTORY: Past Surgical History  Procedure Laterality Date  . Insert / replace / remove pacemaker  08/30/10    DUAL CHAMBER/ST. JUDE  . Pilonidal cyst excision    . Hernia repair    . Eye surgery    . Pacemaker insertion     . Left and right heart catheterization with coronary angiogram N/A 08/09/2014    Procedure: LEFT AND RIGHT HEART CATHETERIZATION WITH CORONARY ANGIOGRAM;  Surgeon: Burnell Blanks, MD;  Location: [facility] CATH LAB;  Service: Cardiovascular;  Laterality: N/A;    FAMILY HISTORY: Family History  Problem Relation Age of Onset  . Cancer Mother     Bladder cancer  . Heart failure Father   . Heart attack Sister     SOCIAL HISTORY: Social History  Substance Use Topics  . Smoking status: Never Smoker   . Smokeless tobacco: Never Used  . Alcohol Use: No    ALLERGIES: Allergies  Allergen Reactions  . Penicillins Other (See Comments)    rash  . Tolectin [Tolmetin Sodium] Other (See Comments)    rash  . Macrodantin Other (See Comments)    rash     MEDICATIONS:  Current Outpatient Prescriptions  Medication Sig Dispense Refill  . acetaminophen-codeine (TYLENOL #3) 300-30 MG tablet TAKE 1 TABLET BY MOUTH EVERY 8 HOURS AS NEEDED FOR PAIN 90 tablet 1  . ALPRAZolam (XANAX) 0.25 MG tablet Take 0.25 mg by mouth every 8 (eight) hours as needed for anxiety.     Marland Kitchen anastrozole (ARIMIDEX) 1 MG tablet Take 1 tablet (1 mg total) by mouth daily. 30 tablet 1  . clindamycin (CLEOCIN) 150 MG capsule Take by mouth as needed. 4 capsules 1 hour prior  to dental work  2  . ELIQUIS 2.5 MG TABS tablet TAKE 1 TABLET BY MOUTH TWICE A DAY 60 tablet 5  . hydrochlorothiazide 25 MG tablet Take 25 mg by mouth daily.     Marland Kitchen IRON-VITAMIN C PO Take 1 tablet by mouth daily.     Marland Kitchen LYRICA 75 MG capsule Take 75 mg by mouth 2 (two) times daily.    . metoprolol succinate (TOPROL-XL) 50 MG 24 hr tablet TAKE 1 TABLET BY MOUTH ONCE DAILY 90 tablet 3  . nortriptyline (PAMELOR) 10 MG capsule Take 10 mg by mouth at bedtime.    . ondansetron (ZOFRAN ODT) 8 MG disintegrating tablet Take 1 tablet (8 mg total) by mouth every 8 (eight) hours as needed for nausea or vomiting. 20 tablet 0  . potassium chloride (K-DUR,KLOR-CON) 10 MEQ  tablet Take 10 mEq by mouth daily.    . quinapril (ACCUPRIL) 40 MG tablet Take 40 mg by mouth 2 (two) times daily.    Marland Kitchen senna (SENOKOT) 8.6 MG TABS tablet Take 2 tablets by mouth at bedtime.    . simvastatin (ZOCOR) 40 MG tablet Take 40 mg by mouth at bedtime.     . Vitamin D, Ergocalciferol, (DRISDOL) 50000 UNITS CAPS capsule Take 50,000 Units by mouth every 7 (seven) days.     Marland Kitchen zolpidem (AMBIEN) 10 MG tablet Take 10 mg by mouth at bedtime.    . nitroGLYCERIN (NITROSTAT) 0.4 MG SL tablet Place 1 tablet (0.4 mg total) under the tongue every 5 (five) minutes as needed for chest pain. (Patient not taking: Reported on 07/21/2015) 25 tablet 6   No current facility-administered medications for this visit.      REVIEW OF SYSTEMS: A 10 point review of systems was conducted and is otherwise negative except for what is noted above.    Health Maintenance  Mammogram: 12/12/2014 Bone Density Scan: 12/2011 normal Eye Exam: 05/2014 repeat    PHYSICAL EXAMINATION: BP 140/41 mmHg  Pulse 79  Temp(Src) 97.9 F (36.6 C) (Oral)  Resp 18  Ht 4\' 10"  (1.473 m)  Wt 138 lb 3.2 oz (62.687 kg)  BMI 28.89 kg/m2  SpO2 98%  GENERAL: Patient is a well appearing female in no acute distress HEENT:  Sclerae anicteric.  Oropharynx clear and moist. No ulcerations or evidence of oropharyngeal candidiasis. Neck is supple.  NODES:  No cervical, supraclavicular, or axillary lymphadenopathy palpated.  BREAST EXAM: Breast inspection showed them to be symmetrical with no nipple discharge. Palpation of the breasts and axilla revealed no obvious mass that I could appreciate. LUNGS:  Clear to auscultation bilaterally.  No wheezes or rhonchi. HEART:  + systolic murmur, regular rate, rhythm ABDOMEN:  Soft, nontender.  Positive, normoactive bowel sounds. No organomegaly palpated. MSK:  No focal spinal tenderness to palpation. Full range of motion bilaterally in the upper extremities. EXTREMITIES:  No peripheral edema.   SKIN:   Clear with no obvious rashes or skin changes. No nail dyscrasia. NEURO:  Nonfocal. Well oriented.  Appropriate affect. ECOG: 1   CBC Latest Ref Rng 07/21/2015 03/01/2015 11/17/2014  WBC 3.9 - 10.3 10e3/uL 8.1 6.7 7.9  Hemoglobin 11.6 - 15.9 g/dL 12.4 12.2 12.0  Hematocrit 34.8 - 46.6 % 38.3 36.2 36.6  Platelets 145 - 400 10e3/uL 229 195 259    CMP Latest Ref Rng 07/21/2015 03/01/2015 11/17/2014  Glucose 70 - 140 mg/dl 146(H) 124(H) 88  BUN 7.0 - 26.0 mg/dL 16.0 16 21.1  Creatinine 0.6 - 1.1 mg/dL 1.0 0.74  0.9  Sodium 136 - 145 mEq/L 138 134(L) 136  Potassium 3.5 - 5.1 mEq/L 4.2 3.6 4.3  Chloride 101 - 111 mmol/L - 96(L) -  CO2 22 - 29 mEq/L 28 28 28   Calcium 8.4 - 10.4 mg/dL 9.8 9.6 9.2  Total Protein 6.4 - 8.3 g/dL 6.8 6.7 6.1(L)  Total Bilirubin 0.20 - 1.20 mg/dL 0.40 0.4 0.38  Alkaline Phos 40 - 150 U/L 138 100 129  AST 5 - 34 U/L 22 32 21  ALT 0 - 55 U/L 10 14 12      RADIOGRAPHIC STUDIES: MAMMOGRAM 12/12/2014 IMPRESSION: No significant change in biopsy proven DCIS. A noncalcified component may not be detectable mammographically but there is no significant mammographic difference since the prior exam to suggest local spread of disease.  RECOMMENDATION: Treatment plan  ASSESSMENT: 82 y.o. woman:  1.  Left  breast high-grade DCIS with comedonecrosis and calcifications, ER 100%, PR 54% -she was diagnosed in May 2013, the tumor measured 1.6 x 1.2 x 1.5 mm.    -Patient was not candidate for surgery due to AS -She doing very well clinically. Exam was unremarkable. Her last mammogram in July 2016 showed a stable left breast calcification. -Continue anastrozole indefinitely, she is tolerating well -Repeat mammogram in 11/2015  2. Bone health  -I encouraged her to continue calcium and vitamin D, eat healthy and try to be physically active. -her bone density scan was normal in 2013, will repeat one before next visit   3. AS, HTN -she will continue follow up with her  PCP  Follow up: -Return to clinic in 6 months with lab, mammo and DEXA before next visit    I spent 20 minutes counseling the patient face to face.  The total time spent in the appointment was 25 minutes.   Truitt Merle  07/21/2015

## 2015-07-21 NOTE — Telephone Encounter (Signed)
per pof to sch pt appt-gave pt copy of avs-sch mamma & DEXA

## 2015-07-27 ENCOUNTER — Ambulatory Visit (INDEPENDENT_AMBULATORY_CARE_PROVIDER_SITE_OTHER): Payer: Federal, State, Local not specified - PPO | Admitting: *Deleted

## 2015-07-27 DIAGNOSIS — I442 Atrioventricular block, complete: Secondary | ICD-10-CM | POA: Diagnosis not present

## 2015-07-27 NOTE — Progress Notes (Signed)
Remote pacemaker transmission.   

## 2015-08-01 ENCOUNTER — Ambulatory Visit (INDEPENDENT_AMBULATORY_CARE_PROVIDER_SITE_OTHER): Payer: Federal, State, Local not specified - PPO | Admitting: Cardiovascular Disease

## 2015-08-01 VITALS — BP 168/70 | HR 88 | Ht <= 58 in | Wt 139.4 lb

## 2015-08-01 DIAGNOSIS — I10 Essential (primary) hypertension: Secondary | ICD-10-CM

## 2015-08-01 DIAGNOSIS — I350 Nonrheumatic aortic (valve) stenosis: Secondary | ICD-10-CM

## 2015-08-01 NOTE — Patient Instructions (Signed)

## 2015-08-01 NOTE — Progress Notes (Signed)
Cardiology Office Note   Date:  08/01/2015   ID:  Erin Charles, DOB [birth year], MRN [id number]  PCP:  Gennette Pac, MD  Cardiologist:   Thayer Headings, MD   Chief Complaint  Patient presents with  . Follow-up    aortic stenosis   Problem List: 1. Hypertension 2. Aortic stenosis - severe , mean gradient of 42, peak gradient of 69. 3. Chest pain 4. Pre-canerous breast mass 5. Macular degeneration 6. Pacer -St. Jude dual- chamber pacemaker 08/29/10   History of Present Illness: Erin Charles is a 82 y.o. female who presents for evalualtion of some episodes of chest discomfort. Pt complains of CP - only with walking .  Better with rest.  For the past  Several weeks , worse over the past 3 days.   Last for several minutes.   Mid / left upper chest , radiates around to the back .  Not associated with dyspnea, no sweats, no pre-syncope  Nov 03, 2014: Jeneal had a cardiac cath last month.  She has no significant CAD and has significant Aortic stenosis.   She has not wanted to go for TAVR - is here to discuss.   Nov. 1, 2016:  Nico is seen today for follow up of aortic stenosis .  No CP or dyspnea.  Walking with a cane.  Limited by her arthritis. .  Jan. 31, 2017:  Doing ok. No CP or dyspnea.    BP is a bit higher today .  Has been better on previous months   Past Medical History  Diagnosis Date  . HTN (hypertension)   . Dyslipidemia   . Severe aortic stenosis     s/p echo in 2010 with AVA of .77  . Complete heart block ([place])     Pacer  . RBBB (right bundle branch block)   . Advanced age   . OA (osteoarthritis)   . Aortic stenosis   . 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 Macular degeneration     Past Surgical History  Procedure Laterality Date  . Insert / replace / remove pacemaker  08/30/10    DUAL CHAMBER/ST. JUDE  . Pilonidal cyst excision    . Hernia repair    . Eye surgery    . Pacemaker insertion    . Left  and right heart catheterization with coronary angiogram N/A 08/09/2014    Procedure: LEFT AND RIGHT HEART CATHETERIZATION WITH CORONARY ANGIOGRAM;  Surgeon: Burnell Blanks, MD;  Location[facility] C[facility] LAB;  Service: Cardiovascular;  Laterality: N/A;     Current Outpatient Prescriptions  Medication Sig Dispense Refill  . acetaminophen-codeine (TYLENOL #3) 300-30 MG tablet TAKE 1 TABLET BY MOUTH EVERY 8 HOURS AS NEEDED FOR PAIN 90 tablet 1  . ALPRAZolam (XANAX) 0.25 MG tablet Take 0.25 mg by mouth every 8 (eight) hours as needed for anxiety.     Marland Kitchen anastrozole (ARIMIDEX) 1 MG tablet Take 1 tablet (1 mg total) by mouth daily. 30 tablet 1  . clindamycin (CLEOCIN) 150 MG capsule Take by mouth as needed. 4 capsules 1 hour prior to dental work  2  . ELIQUIS 2.5 MG TABS tablet TAKE 1 TABLET BY MOUTH TWICE A DAY 60 tablet 5  . hydrochlorothiazide 25 MG tablet Take 25 mg by mouth daily.     Marland Kitchen IRON-VITAMIN C PO Take 1 tablet by mouth daily.     Marland Kitchen LYRICA 75 MG capsule Take 75 mg by mouth  2 (two) times daily.    . metoprolol succinate (TOPROL-XL) 50 MG 24 hr tablet TAKE 1 TABLET BY MOUTH ONCE DAILY 90 tablet 3  . nitroGLYCERIN (NITROSTAT) 0.4 MG SL tablet Place 1 tablet (0.4 mg total) under the tongue every 5 (five) minutes as needed for chest pain. 25 tablet 6  . nortriptyline (PAMELOR) 10 MG capsule Take 10 mg by mouth at bedtime.    . ondansetron (ZOFRAN ODT) 8 MG disintegrating tablet Take 1 tablet (8 mg total) by mouth every 8 (eight) hours as needed for nausea or vomiting. 20 tablet 0  . potassium chloride (K-DUR,KLOR-CON) 10 MEQ tablet Take 10 mEq by mouth daily.    . quinapril (ACCUPRIL) 40 MG tablet Take 40 mg by mouth 2 (two) times daily.    Marland Kitchen senna (SENOKOT) 8.6 MG TABS tablet Take 2 tablets by mouth at bedtime.    . simvastatin (ZOCOR) 40 MG tablet Take 40 mg by mouth at bedtime.     . Vitamin D, Ergocalciferol, (DRISDOL) 50000 UNITS CAPS capsule Take 50,000 Units by mouth every 7 (seven) days.      Marland Kitchen zolpidem (AMBIEN) 10 MG tablet Take 10 mg by mouth at bedtime.     No current facility-administered medications for this visit.    Allergies:   Penicillins; Tolectin; and Macrodantin    Social History:  The patient  reports that she has never smoked. She has never used smokeless tobacco. She reports that she does not drink alcohol or use illicit drugs.   Family History:  The patient's family history includes Cancer in her mother; Heart attack in her sister; Heart failure in her father.    ROS:  Please see the history of present illness.    Review of Systems: Constitutional:  denies fever, chills, diaphoresis, appetite change and fatigue.  HEENT: denies photophobia, eye pain, redness, hearing loss, ear pain, congestion, sore throat, rhinorrhea, sneezing, neck pain, neck stiffness and tinnitus.  Respiratory: denies SOB, DOE, cough, chest tightness, and wheezing.  Cardiovascular: admits to chest pain,   Gastrointestinal: denies nausea, vomiting, abdominal pain, diarrhea, constipation, blood in stool.  Genitourinary: denies dysuria, urgency, frequency, hematuria, flank pain and difficulty urinating.  Musculoskeletal: denies  myalgias, back pain, joint swelling, arthralgias and gait problem.   Skin: denies pallor, rash and wound.  Neurological: denies dizziness, seizures, syncope, weakness, light-headedness, numbness and headaches.   Hematological: denies adenopathy, easy bruising, personal or family bleeding history.  Psychiatric/ Behavioral: denies suicidal ideation, mood changes, confusion, nervousness, sleep disturbance and agitation.       All other systems are reviewed and negative.    PHYSICAL EXAM: VS:  BP 168/70 mmHg  Pulse 88  Ht 4\' 10"  (1.473 m)  Wt 139 lb 6.4 oz (63.231 kg)  BMI 29.14 kg/m2 , BMI Body mass index is 29.14 kg/(m^2). GEN: Well nourished, well developed, in no acute distress, elderly  HEENT: normal Neck: no JVD,  Has bilateral carotid bruits,    Cardiac: RRR; 3/6 systolic  murmur, rubs, or gallops,no edema  Respiratory:  clear to auscultation bilaterally, normal work of breathing GI: soft, nontender, nondistended, + BS MS: no deformity or atrophy Skin: warm and dry, no rash Neuro:  Strength and sensation are intact Psych: normal   EKG:  EKG is ordered today. The ekg ordered today demonstrates AV pacing .    Recent Labs: 07/21/2015: ALT 10; BUN 16.0; Creatinine 1.0; HGB 12.4; Platelets 229; Potassium 4.2; Sodium 138    Lipid Panel  Component Value Date/Time   CHOL 87 08/04/2011 0610   TRIG 157* 08/04/2011 0610   HDL 28* 08/04/2011 0610   CHOLHDL 3.1 08/04/2011 0610   VLDL 31 08/04/2011 0610   LDLCALC 28 08/04/2011 0610      Wt Readings from Last 3 Encounters:  08/01/15 139 lb 6.4 oz (63.231 kg)  07/21/15 138 lb 3.2 oz (62.687 kg)  05/02/15 137 lb 3.2 oz (62.234 kg)      Other studies Reviewed: Additional studies/ records that were reviewed today include: . Review of the above records demonstrates:    ASSESSMENT AND PLAN:   1. Hypertension - BP is well controlled typically.   Its a bit higher today . She will watch her salt intake .  2. Aortic stenosis - severe , mean gradient of 42, peak gradient of 69. - She has a history of moderate to severe aortic stenosis for many years.   She still has no symptoms .  Discussed TAVR again today .     she's currently not having any limitations that we can attribute to her aortic stenosis. She's now 82 years old. I suspect that she has mild dementia.   It's quite possible that are very conservative approach would be best for her but we will continue to evaluate her in subsequent  office visits.     Will wait and see if she has any symptoms.  3. Chest pain - no recent cp.  Seems to be doing well   4. Pre-canerous breast mass 5. Macular degeneration 6. Pacer -St. Jude dual- chamber pacemaker 08/29/10  Current medicines are reviewed at length with the patient today.   The patient does not have concerns regarding medicines.  The following changes have been made:  no change  Disposition:   FU with me in 6 months     Signed, Adahlia Stembridge, Wonda Cheng, MD  08/01/2015 4:42 PM    [facility] [place], [place], [place]  [address] Phone: [phone]; Fax: [phone]

## 2015-08-03 ENCOUNTER — Ambulatory Visit: Payer: Federal, State, Local not specified - PPO | Admitting: Cardiovascular Disease

## 2015-08-08 ENCOUNTER — Ambulatory Visit (HOSPITAL_BASED_OUTPATIENT_CLINIC_OR_DEPARTMENT_OTHER): Payer: Federal, State, Local not specified - PPO | Admitting: Physical Medicine & Rehabilitation

## 2015-08-08 ENCOUNTER — Encounter: Payer: Federal, State, Local not specified - PPO | Attending: Physical Medicine & Rehabilitation

## 2015-08-08 ENCOUNTER — Encounter: Payer: Self-pay | Admitting: Physical Medicine & Rehabilitation

## 2015-08-08 VITALS — BP 162/74 | HR 68 | Resp 70

## 2015-08-08 DIAGNOSIS — M4806 Spinal stenosis, lumbar region: Secondary | ICD-10-CM

## 2015-08-08 DIAGNOSIS — Z5181 Encounter for therapeutic drug level monitoring: Secondary | ICD-10-CM | POA: Diagnosis not present

## 2015-08-08 DIAGNOSIS — Z79899 Other long term (current) drug therapy: Secondary | ICD-10-CM | POA: Diagnosis not present

## 2015-08-08 DIAGNOSIS — M7061 Trochanteric bursitis, right hip: Secondary | ICD-10-CM | POA: Insufficient documentation

## 2015-08-08 DIAGNOSIS — G894 Chronic pain syndrome: Secondary | ICD-10-CM | POA: Diagnosis not present

## 2015-08-08 DIAGNOSIS — M48061 Spinal stenosis, lumbar region without neurogenic claudication: Secondary | ICD-10-CM

## 2015-08-08 MED ORDER — ACETAMINOPHEN-CODEINE #3 300-30 MG PO TABS: 1.0000 | ORAL_TABLET | Freq: Three times a day (TID) | ORAL | Status: DC | PRN

## 2015-08-08 NOTE — Patient Instructions (Signed)
You had an ultrasound-guided right hip bursa injection. Celestone and lidocaine were injected. We can repeat this in 3 months.

## 2015-08-08 NOTE — Progress Notes (Signed)
Right Trochanteric bursa injection With  ultrasound guidance  Indication Trochanteric bursitis. Exam has tenderness over the greater trochanter of the hip. Pain has not responded to conservative care such as exercise therapy and oral medications. Pain interferes with sleep or with mobility, Ultrasound guided injections have been more effective in this patient and has enabled the targeting of the posterior facet Informed consent was obtained after describing risks and benefits of the procedure with the patient these include bleeding bruising and infection. Patient has signed written consent form. Patient placed in a lateral decubitus position with the affected hip superior. Point of maximal pain was palpated marked and prepped with Betadine and entered with a needle to bone contact,posterior facet targeted. Needle slightly withdrawn then 6mg  of betamethasone with 4 cc 1% lidocaine were injected. Patient tolerated procedure well. Post procedure instructions given.

## 2015-08-09 LAB — CUP PACEART REMOTE DEVICE CHECK
Battery Remaining Longevity: 66 mo
Battery Remaining Percentage: 65 %
Battery Voltage: 2.9 V
Brady Statistic AP VP Percent: 12 %
Brady Statistic RA Percent Paced: 12 %
Brady Statistic RV Percent Paced: 99 %
Implantable Lead Implant Date: 20120301
Implantable Lead Location: 753860
Lead Channel Impedance Value: 550 Ohm
Lead Channel Pacing Threshold Pulse Width: 0.5 ms
Lead Channel Sensing Intrinsic Amplitude: 1.1 mV
Lead Channel Setting Pacing Amplitude: 2 V
Lead Channel Setting Pacing Pulse Width: 0.5 ms
MDC IDC LEAD IMPLANT DT: 20120301
MDC IDC LEAD LOCATION: 753859
MDC IDC MSMT LEADCHNL RV IMPEDANCE VALUE: 250 Ohm
MDC IDC MSMT LEADCHNL RV PACING THRESHOLD AMPLITUDE: 1.75 V
MDC IDC PG SERIAL: 7210946
MDC IDC SESS DTM: 20170126074908
MDC IDC SET LEADCHNL RA PACING AMPLITUDE: 2 V
MDC IDC SET LEADCHNL RV SENSING SENSITIVITY: 1.5 mV
MDC IDC STAT BRADY AP VS PERCENT: 1 %
MDC IDC STAT BRADY AS VP PERCENT: 87 %
MDC IDC STAT BRADY AS VS PERCENT: 1 %

## 2015-08-10 ENCOUNTER — Ambulatory Visit: Payer: Federal, State, Local not specified - PPO | Admitting: Physical Medicine & Rehabilitation

## 2015-08-11 ENCOUNTER — Encounter: Payer: Self-pay | Admitting: Cardiology

## 2015-08-16 ENCOUNTER — Other Ambulatory Visit: Payer: Self-pay | Admitting: Physical Medicine & Rehabilitation

## 2015-09-25 ENCOUNTER — Other Ambulatory Visit: Payer: Self-pay | Admitting: Hematology

## 2015-09-25 ENCOUNTER — Other Ambulatory Visit: Payer: Self-pay | Admitting: Cardiovascular Disease

## 2015-09-25 DIAGNOSIS — D0512 Intraductal carcinoma in situ of left breast: Secondary | ICD-10-CM

## 2015-09-29 ENCOUNTER — Other Ambulatory Visit: Payer: Self-pay | Admitting: Cardiovascular Disease

## 2015-10-26 ENCOUNTER — Ambulatory Visit (INDEPENDENT_AMBULATORY_CARE_PROVIDER_SITE_OTHER): Payer: Federal, State, Local not specified - PPO | Admitting: *Deleted

## 2015-10-26 DIAGNOSIS — Z950 Presence of cardiac pacemaker: Secondary | ICD-10-CM | POA: Diagnosis not present

## 2015-10-26 DIAGNOSIS — I442 Atrioventricular block, complete: Secondary | ICD-10-CM

## 2015-10-26 NOTE — Progress Notes (Signed)
Remote pacemaker transmission.   

## 2015-10-30 ENCOUNTER — Ambulatory Visit: Payer: Federal, State, Local not specified - PPO | Admitting: Physical Medicine & Rehabilitation

## 2015-10-30 ENCOUNTER — Encounter (HOSPITAL_COMMUNITY): Payer: Self-pay

## 2015-10-30 ENCOUNTER — Encounter: Payer: Federal, State, Local not specified - PPO | Attending: Physical Medicine & Rehabilitation

## 2015-10-30 ENCOUNTER — Emergency Department (HOSPITAL_COMMUNITY): Payer: Federal, State, Local not specified - PPO

## 2015-10-30 ENCOUNTER — Emergency Department (HOSPITAL_COMMUNITY)
Admission: EM | Admit: 2015-10-30 | Discharge: 2015-10-30 | Disposition: A | Discharge status: Home / Self Care | Payer: Federal, State, Local not specified - PPO | Attending: Emergency Medicine | Admitting: Emergency Medicine

## 2015-10-30 DIAGNOSIS — Z5181 Encounter for therapeutic drug level monitoring: Secondary | ICD-10-CM | POA: Insufficient documentation

## 2015-10-30 DIAGNOSIS — G894 Chronic pain syndrome: Secondary | ICD-10-CM | POA: Insufficient documentation

## 2015-10-30 DIAGNOSIS — E785 Hyperlipidemia, unspecified: Secondary | ICD-10-CM | POA: Insufficient documentation

## 2015-10-30 DIAGNOSIS — R011 Cardiac murmur, unspecified: Secondary | ICD-10-CM | POA: Insufficient documentation

## 2015-10-30 DIAGNOSIS — Z79899 Other long term (current) drug therapy: Secondary | ICD-10-CM | POA: Insufficient documentation

## 2015-10-30 DIAGNOSIS — Z8669 Personal history of other diseases of the nervous system and sense organs: Secondary | ICD-10-CM | POA: Diagnosis not present

## 2015-10-30 DIAGNOSIS — M1990 Unspecified osteoarthritis, unspecified site: Secondary | ICD-10-CM | POA: Insufficient documentation

## 2015-10-30 DIAGNOSIS — I10 Essential (primary) hypertension: Secondary | ICD-10-CM | POA: Insufficient documentation

## 2015-10-30 DIAGNOSIS — Z86018 Personal history of other benign neoplasm: Secondary | ICD-10-CM | POA: Insufficient documentation

## 2015-10-30 DIAGNOSIS — R1013 Epigastric pain: Secondary | ICD-10-CM | POA: Diagnosis present

## 2015-10-30 DIAGNOSIS — M7061 Trochanteric bursitis, right hip: Secondary | ICD-10-CM | POA: Insufficient documentation

## 2015-10-30 DIAGNOSIS — Z880 Allergy status to penicillin: Secondary | ICD-10-CM | POA: Insufficient documentation

## 2015-10-30 DIAGNOSIS — R079 Chest pain, unspecified: Secondary | ICD-10-CM | POA: Diagnosis not present

## 2015-10-30 DIAGNOSIS — Z9889 Other specified postprocedural states: Secondary | ICD-10-CM | POA: Diagnosis not present

## 2015-10-30 LAB — COMPREHENSIVE METABOLIC PANEL
ALT: 14 U/L (ref 14–54)
AST: 29 U/L (ref 15–41)
Albumin: 4 g/dL (ref 3.5–5.0)
Alkaline Phosphatase: 99 U/L (ref 38–126)
Anion gap: 14 (ref 5–15)
BUN: 12 mg/dL (ref 6–20)
CHLORIDE: 96 mmol/L — AB (ref 101–111)
CO2: 24 mmol/L (ref 22–32)
Calcium: 9.9 mg/dL (ref 8.9–10.3)
Creatinine, Ser: 0.85 mg/dL (ref 0.44–1.00)
GFR calc Af Amer: >60 mL/min (ref >60)
GFR, EST NON AFRICAN AMERICAN: 58 mL/min — AB (ref >60)
Glucose, Bld: 112 mg/dL — ABNORMAL HIGH (ref 65–99)
POTASSIUM: 3.8 mmol/L (ref 3.5–5.1)
Sodium: 134 mmol/L — ABNORMAL LOW (ref 135–145)
Total Bilirubin: 0.8 mg/dL (ref 0.3–1.2)
Total Protein: 6.5 g/dL (ref 6.5–8.1)

## 2015-10-30 LAB — CBC
HCT: 36.7 % (ref 36.0–46.0)
Hemoglobin: 12.1 g/dL (ref 12.0–15.0)
MCH: 26.9 pg (ref 26.0–34.0)
MCHC: 33 g/dL (ref 30.0–36.0)
MCV: 81.6 fL (ref 78.0–100.0)
PLATELETS: 223 10*3/uL (ref 150–400)
RBC: 4.5 MIL/uL (ref 3.87–5.11)
RDW: 14.9 % (ref 11.5–15.5)
WBC: 6.1 10*3/uL (ref 4.0–10.5)

## 2015-10-30 LAB — URINALYSIS, ROUTINE W REFLEX MICROSCOPIC
Bilirubin Urine: NEGATIVE
GLUCOSE, UA: NEGATIVE mg/dL
KETONES UR: 40 mg/dL — AB
LEUKOCYTES UA: NEGATIVE
Nitrite: NEGATIVE
PH: 7 (ref 5.0–8.0)
Protein, ur: NEGATIVE mg/dL
SPECIFIC GRAVITY, URINE: 1.014 (ref 1.005–1.030)

## 2015-10-30 LAB — URINE MICROSCOPIC-ADD ON
Bacteria, UA: NONE SEEN
WBC, UA: NONE SEEN WBC/hpf (ref 0–5)

## 2015-10-30 LAB — I-STAT TROPONIN, ED
TROPONIN I, POC: 0.05 ng/mL (ref 0.00–0.08)
TROPONIN I, POC: 0.05 ng/mL (ref 0.00–0.08)

## 2015-10-30 LAB — LIPASE, BLOOD: LIPASE: 20 U/L (ref 11–51)

## 2015-10-30 MED ORDER — ACETAMINOPHEN 500 MG PO TABS
500.0000 mg | ORAL_TABLET | Freq: Once | ORAL | Status: AC
  Administered 2015-10-30: 500 mg via ORAL
  Filled 2015-10-30: qty 1

## 2015-10-30 MED ORDER — GI COCKTAIL ~~LOC~~
30.0000 mL | Freq: Once | ORAL | Status: AC
  Administered 2015-10-30: 30 mL via ORAL
  Filled 2015-10-30: qty 30

## 2015-10-30 MED ORDER — FAMOTIDINE 20 MG PO TABS: 20.0000 mg | ORAL_TABLET | Freq: Two times a day (BID) | ORAL | Status: DC

## 2015-10-30 MED ORDER — IOPAMIDOL (ISOVUE-300) INJECTION 61%
INTRAVENOUS | Status: AC
  Filled 2015-10-30: qty 100

## 2015-10-30 MED ORDER — PANTOPRAZOLE SODIUM 40 MG IV SOLR
40.0000 mg | Freq: Once | INTRAVENOUS | Status: AC
  Administered 2015-10-30: 40 mg via INTRAVENOUS
  Filled 2015-10-30: qty 40

## 2015-10-30 NOTE — ED Provider Notes (Signed)
Care assumed from previous provider PA Ovid Curd, case discussed, plan agreed upon. Patient is a 82 y.o. female who presents with nonradiating epigastric pain 36 hours. Significant cardiac history. Labs and CT chest/abd/pelvis pending at shift change. Will follow up on labs and imaging. Will also obtain delta troponin. Protonix and GI cocktail given - will re-evaluate patient after medication to reassess.   Labs: Troponin 0.05, CBC and lipase wdl; CMP and UA reviewed and reassuring  Gen: afebrile, VSS, NAD HEENT: EOMI, MMM Resp: no resp distress CV: RRR, no MRG Abd: soft, ND - mild epigastric TTP.  MsK: moving all extremities well Neuro: A&O x4  6:38 PM - patient reevaluated. Non-surgical abdomen. Meds improved symptoms. She states that she does not want to have the CT performed today. Significant amount of time was taken to discuss the risks of not obtaining CT scans, however patient still declines. I have discussed my concerns as a provider and the possibility that this may worsen. I have specifically discussed that without further evaluation I cannot guarantee there is not a life threatening event occuring. Daughter is at bedside and agrees with the patient, stating that if she does not want to have the scan, she feels comfortable with discharge to home and can observe her. Patient states she has a primary care provider that she can follow-up with this week. Stressed abdominal return precautions. Will cancel scans since patient is refusing for them to be performed. I informed patient that it is still very important to get second troponin, and patient agrees to stay for this lab draw.  7:07 PM - 2nd troponin also 0.05. Patient informed of results. Again informed patient that I recommended her to stay for imaging, which she again declined. Abdominal pain does appear to be more GERD vs. Gastritis related, however we again discussed abdominal return precautions and importance of PCP follow-up this week.  Agent and daughter at bedside verbalized agreement with plan as dictated above.  [facility] Landon Truax, PA-C 10/30/15 [place], MD 10/30/15 [id number]

## 2015-10-30 NOTE — ED Notes (Addendum)
Per EMS - pt c/o abd pain radiating up to chest. Reports pressure-like pain in chest. Nausea, no vomiting, headache. All symptoms began Sunday 0300 and have been constant. Cardiac hx. Pacemaker. Denies shortness of breath/dizziness.   Given 8mg  zofran w/ some relief

## 2015-10-30 NOTE — ED Provider Notes (Signed)
CSN: [id number]     Arrival date & time 10/30/15  1341 History   First MD Initiated Contact with Patient 10/30/15 1358     Chief Complaint  Patient presents with  . Abdominal Pain  . Chest Pain   HPI Erin Charles is a 82 y.o. female PMH significant for severe aortic stenosis, complete heart block, right bundle branch block, pacemaker, hypertension, hyperlipidemia, ductal carcinoma in situ of left breast presenting with a 2 day history of epigastric abdominal pain. She describes the pain as nonradiating, constant and worse with palpation, nonexertional, 3 out of 10 pain scale, pressure. She endorses nausea without emesis, headache. No fevers, chills, shortness of breath, urinary complaints or changes in bowel or bladder habits.  Past Medical History  Diagnosis Date  . HTN (hypertension)   . Dyslipidemia   . Severe aortic stenosis     s/p echo in 2010 with AVA of .77  . Complete heart block ([place])     Pacer  . RBBB (right bundle branch block)   . Advanced age   . OA (osteoarthritis)   . Aortic stenosis   . 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 Macular degeneration    Past Surgical History  Procedure Laterality Date  . Insert / replace / remove pacemaker  08/30/10    DUAL CHAMBER/[facility]  . Pilonidal cyst excision    . Hernia repair    . Eye surgery    . Pacemaker insertion    . Left and right heart catheterization with coronary angiogram N/A 08/09/2014    Procedure: LEFT AND RIGHT HEART CATHETERIZATION WITH CORONARY ANGIOGRAM;  Surgeon: Burnell Blanks, MD;  Location: [facility] CATH LAB;  Service: Cardiovascular;  Laterality: N/A;   Family History  Problem Relation Age of Onset  . Cancer Mother     Bladder cancer  . Heart failure Father   . Heart attack Sister    Social History  Substance Use Topics  . Smoking status: Never Smoker   . Smokeless tobacco: Never Used  . Alcohol Use: No   OB History    No data available     Review of  Systems  Ten systems are reviewed and are negative for acute change except as noted in the HPI  Allergies  Penicillins; Tolectin; and Macrodantin  Home Medications   Prior to Admission medications   Medication Sig Start Date End Date Taking? Authorizing Provider  acetaminophen-codeine (TYLENOL #3) 300-30 MG tablet Take 1 tablet by mouth every 8 (eight) hours as needed. for pain 08/08/15  Yes Charlett Blake, MD  ALPRAZolam Duanne Moron) 0.25 MG tablet Take 0.25 mg by mouth every 8 (eight) hours as needed for anxiety.    Yes Historical Provider, MD  anastrozole (ARIMIDEX) 1 MG tablet Take 1 tablet (1 mg total) by mouth daily. 09/25/15  Yes Truitt Merle, MD  ELIQUIS 2.5 MG TABS tablet TAKE 1 TABLET BY MOUTH TWICE A DAY 04/25/15  Yes Thayer Headings, MD  hydrochlorothiazide 25 MG tablet Take 25 mg by mouth daily.    Yes Thayer Headings, MD  KLOR-CON M10 10 MEQ tablet TAKE 1 TABLET BY MOUTH EVERY DAY 09/25/15  Yes Thayer Headings, MD  LYRICA 75 MG capsule Take 75 mg by mouth 2 (two) times daily. 07/29/14  Yes Historical Provider, MD  metoprolol succinate (TOPROL-XL) 50 MG 24 hr tablet TAKE 1 TABLET BY MOUTH ONCE DAILY 06/12/15  Yes Thayer Headings, MD  nitroGLYCERIN (NITROSTAT)  0.4 MG SL tablet Place 1 tablet (0.4 mg total) under the tongue every 5 (five) minutes as needed for chest pain. 08/05/14  Yes Thayer Headings, MD  nortriptyline (PAMELOR) 10 MG capsule Take 10 mg by mouth at bedtime.   Yes Historical Provider, MD  quinapril (ACCUPRIL) 40 MG tablet Take 40 mg by mouth 2 (two) times daily.   Yes Historical Provider, MD  senna (SENOKOT) 8.6 MG TABS tablet Take 2 tablets by mouth at bedtime.   Yes Historical Provider, MD  simvastatin (ZOCOR) 40 MG tablet Take 40 mg by mouth at bedtime.    Yes Thayer Headings, MD  Vitamin D, Ergocalciferol, (DRISDOL) 50000 UNITS CAPS capsule Take 50,000 Units by mouth every 7 (seven) days.  07/07/13  Yes Historical Provider, MD  zolpidem (AMBIEN) 10 MG tablet Take 10 mg by  mouth at bedtime. 09/15/13  Yes Historical Provider, MD  clindamycin (CLEOCIN) 150 MG capsule Take by mouth as needed. 4 capsules 1 hour prior to dental work 07/18/14   Historical Provider, MD  IRON-VITAMIN C PO Take 1 tablet by mouth daily.     Historical Provider, MD  KLOR-CON M10 10 MEQ tablet TAKE 1 TABLET BY MOUTH EVERY DAY 10/02/15   Thayer Headings, MD  ondansetron (ZOFRAN ODT) 8 MG disintegrating tablet Take 1 tablet (8 mg total) by mouth every 8 (eight) hours as needed for nausea or vomiting. 03/01/15   Lacretia Leigh, MD  potassium chloride (K-DUR,KLOR-CON) 10 MEQ tablet Take 10 mEq by mouth daily.    Historical Provider, MD   BP 149/88 mmHg  Pulse 59  Temp(Src) 98.1 F (36.7 C) (Rectal)  Resp 18  SpO2 98% Physical Exam  Constitutional: She is oriented to person, place, and time. She appears well-developed and well-nourished. No distress.  Elderly, frail appearing.  HENT:  Head: Normocephalic and atraumatic.  Mouth/Throat: Oropharynx is clear and moist. No oropharyngeal exudate.  Eyes: Conjunctivae are normal. Pupils are equal, round, and reactive to light. Right eye exhibits no discharge. Left eye exhibits no discharge. No scleral icterus.  Neck: No tracheal deviation present.  Cardiovascular: Normal rate, regular rhythm and intact distal pulses.  Exam reveals no gallop and no friction rub.   Murmur heard. Holosystolic murmur.  Pulmonary/Chest: Effort normal and breath sounds normal. No respiratory distress. She has no wheezes. She has no rales. She exhibits no tenderness.  Abdominal: Soft. Bowel sounds are normal. She exhibits no distension and no mass. There is tenderness. There is no rebound and no guarding.  Minimal, distractible epigastric tenderness. No pulsating mass.  Musculoskeletal: Normal range of motion. She exhibits no edema.  Lymphadenopathy:    She has no cervical adenopathy.  Neurological: She is alert and oriented to person, place, and time. Coordination normal.   Skin: Skin is warm and dry. No rash noted. She is not diaphoretic. No erythema.  Psychiatric: She has a normal mood and affect. Her behavior is normal.  Nursing note and vitals reviewed.   ED Course  Procedures  Labs Review Labs Reviewed  CBC  COMPREHENSIVE METABOLIC PANEL  LIPASE, BLOOD  URINALYSIS, ROUTINE W REFLEX MICROSCOPIC (NOT AT [facility])  I-STAT TROPOININ, ED    Imaging Review Dg Chest 2 View  10/30/2015  CLINICAL DATA:  Mid chest pain, difficulty breathing, symptoms at onset today, history hypertension, severe aortic stenosis, atrial fibrillation EXAM: CHEST  2 VIEW COMPARISON:  08/03/2011 FINDINGS: LEFT subclavian transvenous pacemaker leads project at RIGHT atrium and RIGHT ventricle. Enlargement of cardiac silhouette.  Mediastinal contours and pulmonary vascularity normal. Calcification and elongation of thoracic aorta again noted. Emphysematous and bronchitic changes compatible with COPD. Question nodular density RIGHT lung base 17 mm diameter new since previous exam. Remaining lungs clear. No pleural effusion or pneumothorax. Bones demineralized with degenerative disc and facet disease changes of the cervical and thoracic spine. Chronic RIGHT rotator cuff tear with RIGHT glenohumeral and AC joint degenerative changes. IMPRESSION: COPD changes with question new nodular density at RIGHT lung base ; CT chest recommended to exclude pulmonary nodule. Enlargement of cardiac silhouette post pacemaker. Electronically Signed   By: Lavonia Dana M.D.   On: 10/30/2015 15:13   I have personally reviewed and evaluated these images and lab results as part of my medical decision-making.   EKG Interpretation   Date/Time:  Monday Oct 30 2015 13:45:57 EDT Ventricular Rate:  68 PR Interval:  231 QRS Duration: 153 QT Interval:  458 QTC Calculation: 487 R Axis:   -80 Text Interpretation:  A-V dual-paced rhythm with some inhibition No  further analysis attempted due to paced rhythm Confirmed by  Alvino Chapel  MD,  Ovid Curd [phone]) on 10/30/2015 2:25:11 PM      MDM   Final diagnoses:  Epigastric pain   Patient nontoxic appearing, VSS. Based on patient history and physical exam, most likely etiologies are gastritis/PUD vs GERD. Less likely etiologies include pancreatitis.  CXR demonstrates new nodular density at right lung base with a recommendation to exclude pulmonary nodule on CT chest. Given that patient's age (risk for AAA), will CT CAP today.  UA with small hgb and 40 ketones, otherwise unremarkable.  Lipase, CMP, CBC, troponin, EKG unremarkable for acute change. Shift change handoff to [facility], PA-C: Patient pending CT and 2nd troponin. If negative, patient may be safely discharged with PCP follow-up.  Dr. Alvino Chapel also evaluated patient and agrees with above plan.   [place], PA-C 11/06/15 1349  Davonna Belling, MD 11/08/15 [phone]

## 2015-10-30 NOTE — Discharge Instructions (Signed)
It is very important that you follow-up with your primary care provider this week in regards to today's visit.  Please seek immediate care if you develop any of the following symptoms: The pain does not go away.  You have a fever.  You keep throwing up (vomiting).  You pass bloody or black tarry stools.  There is bright red blood in the stool.  There is belly (abdominal) or rectal pain.  You do not seem to be getting better.  You have any questions or concerns.

## 2015-10-30 NOTE — ED Notes (Signed)
Pt transported to CT ?

## 2015-11-07 ENCOUNTER — Ambulatory Visit (HOSPITAL_BASED_OUTPATIENT_CLINIC_OR_DEPARTMENT_OTHER): Payer: Federal, State, Local not specified - PPO | Admitting: Physical Medicine & Rehabilitation

## 2015-11-07 ENCOUNTER — Encounter: Payer: Self-pay | Admitting: Physical Medicine & Rehabilitation

## 2015-11-07 VITALS — BP 133/64 | HR 65 | Resp 14

## 2015-11-07 DIAGNOSIS — G894 Chronic pain syndrome: Secondary | ICD-10-CM | POA: Diagnosis not present

## 2015-11-07 DIAGNOSIS — Z5181 Encounter for therapeutic drug level monitoring: Secondary | ICD-10-CM | POA: Diagnosis not present

## 2015-11-07 DIAGNOSIS — Z79899 Other long term (current) drug therapy: Secondary | ICD-10-CM

## 2015-11-07 DIAGNOSIS — M7061 Trochanteric bursitis, right hip: Secondary | ICD-10-CM | POA: Diagnosis not present

## 2015-11-07 NOTE — Progress Notes (Signed)
Right Trochanteric bursa injection With  ultrasound guidance  Indication Trochanteric bursitis. Exam has tenderness over the greater trochanter of the hip. Pain has not responded to conservative care such as exercise therapy and oral medications. Pain interferes with sleep or with mobility, Ultrasound guided injections have been more effective in this patient and has enabled the targeting of the posterior facet Informed consent was obtained after describing risks and benefits of the procedure with the patient these include bleeding bruising and infection. Patient has signed written consent form. Patient placed in a lateral decubitus position with the affected hip superior. Point of maximal pain was palpated marked and prepped with Betadine and entered with a needle to bone contact,posterior facet targeted. Needle slightly withdrawn then 6mg  of betamethasone with 4 cc 1% lidocaine were injected. Patient tolerated procedure well. Post procedure instructions given.

## 2015-11-07 NOTE — Patient Instructions (Signed)
Hip Bursitis Bursitis is a swelling and soreness (inflammation) of a fluid-filled sac (bursa). This sac overlies and protects the joints.  CAUSES   Injury.  Overuse of the muscles surrounding the joint.  Arthritis.  Gout.  Infection.  Cold weather.  Inadequate warm-up and conditioning prior to activities. The cause may not be known.  SYMPTOMS   Mild to severe irritation.  Tenderness and swelling over the outside of the hip.  Pain with motion of the hip.  If the bursa becomes infected, a fever may be present. Redness, tenderness, and warmth will develop over the hip. Symptoms usually lessen in 3 to 4 weeks with treatment, but can come back. TREATMENT If conservative treatment does not work, your caregiver may advise draining the bursa and injecting cortisone into the area. This may speed up the healing process. This may also be used as an initial treatment of choice. HOME CARE INSTRUCTIONS   Apply ice to the affected area for 15-20 minutes every 3 to 4 hours while awake for the first 2 days. Put the ice in a plastic bag and place a towel between the bag of ice and your skin.  Rest the painful joint as much as possible, but continue to put the joint through a normal range of motion at least 4 times per day. When the pain lessens, begin normal, slow movements and usual activities to help prevent stiffness of the hip.  Only take over-the-counter or prescription medicines for pain, discomfort, or fever as directed by your caregiver.  Use crutches to limit weight bearing on the hip joint, if advised.  Elevate your painful hip to reduce swelling. Use pillows for propping and cushioning your legs and hips.  Gentle massage may provide comfort and decrease swelling. SEEK IMMEDIATE MEDICAL CARE IF:   Your pain increases even during treatment, or you are not improving.  You have a fever.  You have heat and inflammation over the involved bursa.  You have any other questions or  concerns. MAKE SURE YOU:   Understand these instructions.  Will watch your condition.  Will get help right away if you are not doing well or get worse.   This information is not intended to replace advice given to you by your health care provider. Make sure you discuss any questions you have with your health care provider.   Document Released: 12/07/2001 Document Revised: 09/09/2011 Document Reviewed: 01/17/2015 Elsevier Interactive Patient Education 2016 Elsevier Inc.  

## 2015-11-08 ENCOUNTER — Other Ambulatory Visit: Payer: Self-pay | Admitting: Physical Medicine & Rehabilitation

## 2015-11-15 ENCOUNTER — Other Ambulatory Visit: Payer: Self-pay | Admitting: Cardiovascular Disease

## 2015-12-06 ENCOUNTER — Encounter: Payer: Self-pay | Admitting: Cardiology

## 2015-12-07 LAB — CUP PACEART REMOTE DEVICE CHECK
Battery Remaining Percentage: 65 %
Brady Statistic AP VS Percent: 1 %
Brady Statistic AS VP Percent: 84 %
Brady Statistic AS VS Percent: 1 %
Brady Statistic RA Percent Paced: 16 %
Brady Statistic RV Percent Paced: 99 %
Date Time Interrogation Session: 20170427060014
Implantable Lead Implant Date: 20120301
Implantable Lead Location: 753859
Implantable Lead Location: 753860
Lead Channel Impedance Value: 250 Ohm
Lead Channel Impedance Value: 580 Ohm
Lead Channel Pacing Threshold Amplitude: 1.25 V
Lead Channel Pacing Threshold Pulse Width: 0.5 ms
Lead Channel Sensing Intrinsic Amplitude: 0.9 mV
Lead Channel Setting Sensing Sensitivity: 1.5 mV
MDC IDC LEAD IMPLANT DT: 20120301
MDC IDC MSMT BATTERY REMAINING LONGEVITY: 67 mo
MDC IDC MSMT BATTERY VOLTAGE: 2.9 V
MDC IDC PG SERIAL: 7210946
MDC IDC SET LEADCHNL RA PACING AMPLITUDE: 2 V
MDC IDC SET LEADCHNL RV PACING AMPLITUDE: 1.5 V
MDC IDC SET LEADCHNL RV PACING PULSEWIDTH: 0.5 ms
MDC IDC STAT BRADY AP VP PERCENT: 16 %
Pulse Gen Model: 2210

## 2015-12-18 ENCOUNTER — Other Ambulatory Visit: Payer: Medicare Other

## 2015-12-20 ENCOUNTER — Other Ambulatory Visit: Payer: Self-pay | Admitting: *Deleted

## 2015-12-20 DIAGNOSIS — D0512 Intraductal carcinoma in situ of left breast: Secondary | ICD-10-CM

## 2015-12-20 MED ORDER — ANASTROZOLE 1 MG PO TABS: 1.0000 mg | ORAL_TABLET | Freq: Every day | ORAL | Status: DC

## 2016-01-22 ENCOUNTER — Encounter: Payer: Self-pay | Admitting: Hematology

## 2016-01-22 ENCOUNTER — Ambulatory Visit (HOSPITAL_BASED_OUTPATIENT_CLINIC_OR_DEPARTMENT_OTHER): Payer: Federal, State, Local not specified - PPO | Admitting: Hematology

## 2016-01-22 ENCOUNTER — Telehealth: Payer: Self-pay | Admitting: Hematology

## 2016-01-22 ENCOUNTER — Other Ambulatory Visit (HOSPITAL_BASED_OUTPATIENT_CLINIC_OR_DEPARTMENT_OTHER): Payer: Federal, State, Local not specified - PPO

## 2016-01-22 VITALS — BP 137/37 | HR 60 | Temp 97.6°F | Resp 17 | Ht <= 58 in | Wt 136.7 lb

## 2016-01-22 DIAGNOSIS — I10 Essential (primary) hypertension: Secondary | ICD-10-CM

## 2016-01-22 DIAGNOSIS — E2839 Other primary ovarian failure: Secondary | ICD-10-CM

## 2016-01-22 DIAGNOSIS — D0512 Intraductal carcinoma in situ of left breast: Secondary | ICD-10-CM

## 2016-01-22 DIAGNOSIS — D0592 Unspecified type of carcinoma in situ of left breast: Secondary | ICD-10-CM

## 2016-01-22 DIAGNOSIS — I350 Nonrheumatic aortic (valve) stenosis: Secondary | ICD-10-CM

## 2016-01-22 DIAGNOSIS — M160 Bilateral primary osteoarthritis of hip: Secondary | ICD-10-CM

## 2016-01-22 LAB — CBC WITH DIFFERENTIAL/PLATELET
BASO%: 1.4 % (ref 0.0–2.0)
Basophils Absolute: 0.1 10*3/uL (ref 0.0–0.1)
EOS%: 11.5 % — AB (ref 0.0–7.0)
Eosinophils Absolute: 0.7 10*3/uL — ABNORMAL HIGH (ref 0.0–0.5)
HEMATOCRIT: 38.7 % (ref 34.8–46.6)
HEMOGLOBIN: 12.6 g/dL (ref 11.6–15.9)
LYMPH#: 1.2 10*3/uL (ref 0.9–3.3)
LYMPH%: 18.8 % (ref 14.0–49.7)
MCH: 26.5 pg (ref 25.1–34.0)
MCHC: 32.6 g/dL (ref 31.5–36.0)
MCV: 81.4 fL (ref 79.5–101.0)
MONO#: 0.7 10*3/uL (ref 0.1–0.9)
MONO%: 11.2 % (ref 0.0–14.0)
NEUT%: 57.1 % (ref 38.4–76.8)
NEUTROS ABS: 3.7 10*3/uL (ref 1.5–6.5)
Platelets: 217 10*3/uL (ref 145–400)
RBC: 4.76 10*6/uL (ref 3.70–5.45)
RDW: 16 % — ABNORMAL HIGH (ref 11.2–14.5)
WBC: 6.4 10*3/uL (ref 3.9–10.3)

## 2016-01-22 LAB — COMPREHENSIVE METABOLIC PANEL
ALBUMIN: 4.1 g/dL (ref 3.5–5.0)
ALT: 11 U/L (ref 0–55)
AST: 25 U/L (ref 5–34)
Alkaline Phosphatase: 122 U/L (ref 40–150)
Anion Gap: 10 mEq/L (ref 3–11)
BILIRUBIN TOTAL: 0.5 mg/dL (ref 0.20–1.20)
BUN: 20.9 mg/dL (ref 7.0–26.0)
CALCIUM: 9.9 mg/dL (ref 8.4–10.4)
CHLORIDE: 101 meq/L (ref 98–109)
CO2: 28 mEq/L (ref 22–29)
CREATININE: 1.1 mg/dL (ref 0.6–1.1)
EGFR: 43 mL/min/{1.73_m2} — ABNORMAL LOW (ref >90)
Glucose: 110 mg/dl (ref 70–140)
Potassium: 4.3 mEq/L (ref 3.5–5.1)
Sodium: 139 mEq/L (ref 136–145)
TOTAL PROTEIN: 6.8 g/dL (ref 6.4–8.3)

## 2016-01-22 NOTE — Progress Notes (Signed)
[place]  [phone] [phone]) [phone]  OFFICE PROGRESS NOTE  PATIENT: Erin Charles   DOB: Aug 28, 1922  MR#: [id number]  [id number]  CHIEF COMPLAINT: follow up DCIS   PRIOR ONCOLOGIC HISTORY: 82 year old [place] woman with a history of left breast DCIS with comedonecrosis and calcifications diagnosed in 10/2011.  1.  Status post left breast needle core biopsy of the upper outer quadrant on 11/20/2011 which showed high-grade DCIS with comedonecrosis and calcifications, ER 100%, PR 54%, which measured 1.6 x 1.2 x 1.5 mm.    The patient has a history of using hormone replacement therapy for over 40 years with Premarin and Provera prior to DCIS diagnosis.  Patient has been on anastrozole since 09/2011. She was not a candidate for lumpectomy with her aortic stenosis history.    CURRENT THERAPY: Anastrozole 1 mg once daily started in 09/2011  INTERVAL HISTORY: Erin Charles is here for follow up of her DCIS in her left breast.  She is accompanied by her husband to clinic today. She is doing well overall. She has chronic arthritis of the hips, able to walk with a cane, but not very active. She has been compliant with anastrozole, tolerated well, denies any hot flashes or other side effects. She has been following up with her cardiologist also, no other new changes since her last visit 6 months ago.  PAST MEDICAL HISTORY: Past Medical History:  Diagnosis Date  . Advanced age   . Aortic stenosis   . Complete heart block ([place])    Pacer  . 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 Dyslipidemia   . HTN (hypertension)   . Macular degeneration   . OA (osteoarthritis)   . RBBB (right bundle branch block)   . Severe aortic stenosis    s/p echo in 2010 with AVA of .77    PAST SURGICAL HISTORY: Past Surgical History:  Procedure Laterality Date  . EYE SURGERY    . HERNIA REPAIR    . INSERT / REPLACE / REMOVE PACEMAKER   08/30/10   DUAL CHAMBER/[facility]UDE  . LEFT AND RIGHT HEART CATHETERIZATION WITH CORONARY ANGIOGRAM N/A 08/09/2014   Procedure: LEFT AND RIGHT HEART CATHETERIZATION WITH CORONARY ANGIOGRAM;  Surgeon: Burnell Blanks, MD;  Location: [facility] CATH LAB;  Service: Cardiovascular;  Laterality: N/A;  . PACEMAKER INSERTION    . PILONIDAL CYST EXCISION      FAMILY HISTORY: Family History  Problem Relation Age of Onset  . Cancer Mother     Bladder cancer  . Heart failure Father   . Heart attack Sister     SOCIAL HISTORY: Social History  Substance Use Topics  . Smoking status: Never Smoker  . Smokeless tobacco: Never Used  . Alcohol use No    ALLERGIES: Allergies  Allergen Reactions  . Macrodantin Other (See Comments)    rash  . Penicillins Rash and Other (See Comments)  . Tolectin [Tolmetin Sodium] Rash     MEDICATIONS:  Current Outpatient Prescriptions  Medication Sig Dispense Refill  . acetaminophen-codeine (TYLENOL #3) 300-30 MG tablet TAKE 1 TABLET BY MOUTH EVERY 8 HOURS AS NEEDED FOR PAIN 90 tablet 2  . ALPRAZolam (XANAX) 0.25 MG tablet Take 0.25 mg by mouth every 8 (eight) hours as needed for anxiety.     Marland Kitchen anastrozole (ARIMIDEX) 1 MG tablet Take 1 tablet (1 mg total) by mouth daily. 90 tablet 3  . clindamycin (CLEOCIN) 150 MG capsule Take by mouth as needed.  4 capsules 1 hour prior to dental work  2  . ELIQUIS 2.5 MG TABS tablet TAKE 1 TABLET BY MOUTH TWICE A DAY 180 tablet 2  . famotidine (PEPCID) 20 MG tablet Take 1 tablet (20 mg total) by mouth 2 (two) times daily. 10 tablet 0  . hydrochlorothiazide 25 MG tablet Take 25 mg by mouth daily.     Marland Kitchen KLOR-CON M10 10 MEQ tablet TAKE 1 TABLET BY MOUTH EVERY DAY 90 tablet 1  . KLOR-CON M10 10 MEQ tablet TAKE 1 TABLET BY MOUTH EVERY DAY 90 tablet 1  . LYRICA 75 MG capsule Take 75 mg by mouth 2 (two) times daily.    . metoprolol succinate (TOPROL-XL) 50 MG 24 hr tablet TAKE 1 TABLET BY MOUTH ONCE DAILY 90 tablet 3  . nitroGLYCERIN  (NITROSTAT) 0.4 MG SL tablet Place 1 tablet (0.4 mg total) under the tongue every 5 (five) minutes as needed for chest pain. 25 tablet 6  . nortriptyline (PAMELOR) 10 MG capsule Take 10 mg by mouth at bedtime.    . ondansetron (ZOFRAN ODT) 8 MG disintegrating tablet Take 1 tablet (8 mg total) by mouth every 8 (eight) hours as needed for nausea or vomiting. 20 tablet 0  . quinapril (ACCUPRIL) 40 MG tablet Take 40 mg by mouth 2 (two) times daily.    Marland Kitchen senna (SENOKOT) 8.6 MG TABS tablet Take 2 tablets by mouth at bedtime.    . simvastatin (ZOCOR) 40 MG tablet Take 40 mg by mouth at bedtime.     . Vitamin D, Ergocalciferol, (DRISDOL) 50000 UNITS CAPS capsule Take 50,000 Units by mouth every 7 (seven) days.     Marland Kitchen zolpidem (AMBIEN) 10 MG tablet Take 10 mg by mouth at bedtime.     No current facility-administered medications for this visit.       REVIEW OF SYSTEMS: A 10 point review of systems was conducted and is otherwise negative except for what is noted above.    Health Maintenance  Mammogram: 12/12/2014 Bone Density Scan: 12/2011 normal Eye Exam: 05/2014 repeat    PHYSICAL EXAMINATION: BP (!) 137/37 (BP Location: Left Arm, Patient Position: Sitting) Comment: made the nurse Thu aware of low BP  Pulse 60   Temp 97.6 F (36.4 C) (Oral)   Resp 17   Ht 4\' 10"  (1.473 m)   Wt 136 lb 11.2 oz (62 kg)   SpO2 100%   BMI 28.57 kg/m   GENERAL: Patient is a well appearing female in no acute distress HEENT:  Sclerae anicteric.  Oropharynx clear and moist. No ulcerations or evidence of oropharyngeal candidiasis. Neck is supple.  NODES:  No cervical, supraclavicular, or axillary lymphadenopathy palpated.  BREAST EXAM: Breast inspection showed them to be symmetrical with no nipple discharge. Palpation of the breasts and axilla revealed no obvious mass that I could appreciate. LUNGS:  Clear to auscultation bilaterally.  No wheezes or rhonchi. HEART:  + systolic murmur, regular rate, rhythm ABDOMEN:   Soft, nontender.  Positive, normoactive bowel sounds. No organomegaly palpated. MSK:  No focal spinal tenderness to palpation. Full range of motion bilaterally in the upper extremities. EXTREMITIES:  No peripheral edema.   SKIN:  Clear with no obvious rashes or skin changes. No nail dyscrasia. NEURO:  Nonfocal. Well oriented.  Appropriate affect. ECOG: 2   CBC Latest Ref Rng & Units 01/22/2016 10/30/2015 07/21/2015  WBC 3.9 - 10.3 10e3/uL 6.4 6.1 8.1  Hemoglobin 11.6 - 15.9 g/dL 12.6 12.1 12.4  Hematocrit 34.8 -  46.6 % 38.7 36.7 38.3  Platelets 145 - 400 10e3/uL 217 223 229    CMP Latest Ref Rng & Units 01/22/2016 10/30/2015 07/21/2015  Glucose 70 - 140 mg/dl 110 112(H) 146(H)  BUN 7.0 - 26.0 mg/dL 20.9 12 16.0  Creatinine 0.6 - 1.1 mg/dL 1.1 0.85 1.0  Sodium 136 - 145 mEq/L 139 134(L) 138  Potassium 3.5 - 5.1 mEq/L 4.3 3.8 4.2  Chloride 101 - 111 mmol/L - 96(L) -  CO2 22 - 29 mEq/L 28 24 28   Calcium 8.4 - 10.4 mg/dL 9.9 9.9 9.8  Total Protein 6.4 - 8.3 g/dL 6.8 6.5 6.8  Total Bilirubin 0.20 - 1.20 mg/dL 0.50 0.8 0.40  Alkaline Phos 40 - 150 U/L 122 99 138  AST 5 - 34 U/L 25 29 22   ALT 0 - 55 U/L 11 14 10      RADIOGRAPHIC STUDIES: MAMMOGRAM 12/12/2014 IMPRESSION: No significant change in biopsy proven DCIS. A noncalcified component may not be detectable mammographically but there is no significant mammographic difference since the prior exam to suggest local spread of disease.  RECOMMENDATION: Treatment plan  ASSESSMENT: 82 y.o. woman:  1.  Left  breast high-grade DCIS with comedonecrosis and calcifications, ER 100%, PR 54% -she was diagnosed in May 2013, the tumor measured 1.6 x 1.2 x 1.5 mm.    -Patient was not candidate for surgery due to AS -She doing very well clinically. Exam was unremarkable. Her last mammogram in July 2016 showed a stable left breast calcification. -Continue anastrozole indefinitely, she is tolerating well -I encouraged her to repeat a mammogram once  a year, she is overdue, she is agreeable to have it done next month  2. Bone health  -I encouraged her to continue calcium and vitamin D, eat healthy and try to be physically active. -her bone density scan was normal in 2013, will repeat one in the next month   3. AS, HTN -she will continue follow up with her PCP  Follow up: -Return to clinic in 6 months with lab, mammo and DEXA within next month at breast center.   I spent 20 minutes counseling the patient face to face.  The total time spent in the appointment was 25 minutes.   Truitt Merle  01/22/2016

## 2016-01-22 NOTE — Addendum Note (Signed)
Addended by: Truitt Merle on: 01/22/2016 11:42 AM   Modules accepted: Orders

## 2016-01-22 NOTE — Telephone Encounter (Signed)
Gave pt cal & avs, no pof put in

## 2016-01-25 ENCOUNTER — Ambulatory Visit (INDEPENDENT_AMBULATORY_CARE_PROVIDER_SITE_OTHER): Payer: Federal, State, Local not specified - PPO | Admitting: *Deleted

## 2016-01-25 DIAGNOSIS — I442 Atrioventricular block, complete: Secondary | ICD-10-CM | POA: Diagnosis not present

## 2016-01-25 NOTE — Progress Notes (Signed)
Remote pacemaker transmission.   

## 2016-01-26 ENCOUNTER — Encounter: Payer: Self-pay | Admitting: Cardiology

## 2016-02-01 LAB — CUP PACEART REMOTE DEVICE CHECK
Battery Remaining Longevity: 66 mo
Battery Remaining Percentage: 65 %
Battery Voltage: 2.9 V
Brady Statistic AP VS Percent: 1 %
Brady Statistic RA Percent Paced: 23 %
Brady Statistic RV Percent Paced: 99 %
Implantable Lead Implant Date: 20120301
Implantable Lead Location: 753860
Lead Channel Impedance Value: 590 Ohm
Lead Channel Pacing Threshold Amplitude: 1.625 V
Lead Channel Sensing Intrinsic Amplitude: 1.1 mV
Lead Channel Setting Pacing Amplitude: 1.875
MDC IDC LEAD IMPLANT DT: 20120301
MDC IDC LEAD LOCATION: 753859
MDC IDC MSMT LEADCHNL RV IMPEDANCE VALUE: 250 Ohm
MDC IDC MSMT LEADCHNL RV PACING THRESHOLD PULSEWIDTH: 0.5 ms
MDC IDC MSMT LEADCHNL RV SENSING INTR AMPL: 7.5 mV
MDC IDC PG SERIAL: 7210946
MDC IDC SESS DTM: 20170727065237
MDC IDC SET LEADCHNL RA PACING AMPLITUDE: 2 V
MDC IDC SET LEADCHNL RV PACING PULSEWIDTH: 0.5 ms
MDC IDC SET LEADCHNL RV SENSING SENSITIVITY: 1.5 mV
MDC IDC STAT BRADY AP VP PERCENT: 24 %
MDC IDC STAT BRADY AS VP PERCENT: 76 %
MDC IDC STAT BRADY AS VS PERCENT: 1 %

## 2016-02-02 ENCOUNTER — Other Ambulatory Visit: Payer: Medicare Other

## 2016-02-13 ENCOUNTER — Ambulatory Visit: Payer: Medicare Other | Admitting: Physical Medicine & Rehabilitation

## 2016-02-13 ENCOUNTER — Telehealth: Payer: Self-pay | Admitting: Physical Medicine & Rehabilitation

## 2016-02-13 NOTE — Telephone Encounter (Signed)
AK is out.Marland KitchenPlease advise on refill?

## 2016-02-13 NOTE — Telephone Encounter (Signed)
Patient is needing a refill on Tylenol #3

## 2016-02-14 ENCOUNTER — Encounter: Payer: Self-pay | Admitting: Cardiovascular Disease

## 2016-02-14 ENCOUNTER — Ambulatory Visit (INDEPENDENT_AMBULATORY_CARE_PROVIDER_SITE_OTHER): Payer: Federal, State, Local not specified - PPO | Admitting: Cardiovascular Disease

## 2016-02-14 VITALS — BP 144/56 | HR 68 | Ht <= 58 in | Wt 138.4 lb

## 2016-02-14 DIAGNOSIS — I350 Nonrheumatic aortic (valve) stenosis: Secondary | ICD-10-CM | POA: Diagnosis not present

## 2016-02-14 MED ORDER — ACETAMINOPHEN-CODEINE #3 300-30 MG PO TABS: 1.0000 | ORAL_TABLET | Freq: Three times a day (TID) | ORAL | 2 refills | Status: DC | PRN

## 2016-02-14 NOTE — Patient Instructions (Signed)
Medication Instructions:  Your physician recommends that you continue on your current medications as directed. Please refer to the Current Medication list given to you today.   Labwork: None Ordered   Testing/Procedures: Your physician has requested that you have an echocardiogram. Echocardiography is a painless test that uses sound waves to create images of your heart. It provides your doctor with information about the size and shape of your heart and how well your heart's chambers and valves are working. This procedure takes approximately one hour. There are no restrictions for this procedure.   Follow-Up Your physician wants you to follow-up in: 6 months with Dr. Nahser.  You will receive a reminder letter in the mail two months in advance. If you don't receive a letter, please call our office to schedule the follow-up appointment.   If you need a refill on your cardiac medications before your next appointment, please call your pharmacy.   Thank you for choosing [facility]! Ednah Hammock, RN [phone]    

## 2016-02-14 NOTE — Telephone Encounter (Signed)
Return Erin Charles call, Tylenol #3 last filled 01/15/16 according to [place]. Tylenol #3  ordered. Placed a call to Erin Charles she verbalizes understanding. She has an appointment with Dr. Letta Pate 02/23/2016

## 2016-02-14 NOTE — Progress Notes (Signed)
Cardiology Office Note   Date:  02/14/2016   ID:  Erin Charles, DOB [birth year], MRN [id number]  PCP:  Gennette Pac, MD  Cardiologist:   Mertie Moores, MD   No chief complaint on file.  Problem List: 1. Hypertension 2. Aortic stenosis - severe , mean gradient of 42, peak gradient of 69. 3. Chest pain 4. Pre-canerous breast mass 5. Macular degeneration 6. Pacer -St. Jude dual- chamber pacemaker 08/29/10   History of Present Illness: Erin Charles is a 82 y.o. female who presents for evalualtion of some episodes of chest discomfort. Pt complains of CP - only with walking .  Better with rest.  For the past  Several weeks , worse over the past 3 days.   Last for several minutes.   Mid / left upper chest , radiates around to the back .  Not associated with dyspnea, no sweats, no pre-syncope  Nov 03, 2014: Erin Charles had a cardiac cath last month.  She has no significant CAD and has significant Aortic stenosis.   She has not wanted to go for TAVR - is here to discuss.   Nov. 1, 2016:  Erin Charles is seen today for follow up of aortic stenosis .  No CP or dyspnea.  Walking with a cane.  Limited by her arthritis. .  Jan. 31, 2017:  Doing ok. No CP or dyspnea.    BP is a bit higher today .  Has been better on previous months   Aug . 16, 2017:  Doing well from a cardiac standpoint.  Has a UTI. breathig is good. No CP .     Past Medical History:  Diagnosis Date  . Advanced age   . Aortic stenosis   . Complete heart block ([place])    Pacer  . 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 Dyslipidemia   . HTN (hypertension)   . Macular degeneration   . OA (osteoarthritis)   . RBBB (right bundle branch block)   . Severe aortic stenosis    s/p echo in 2010 with AVA of .77    Past Surgical History:  Procedure Laterality Date  . EYE SURGERY    . HERNIA REPAIR    . INSERT / REPLACE / REMOVE PACEMAKER  08/30/10   DUAL CHAMBER/ST. JUDE  . LEFT  AND RIGHT HEART CATHETERIZATION WITH CORONARY ANGIOGRAM N/A 08/09/2014   Procedure: LEFT AND RIGHT HEART CATHETERIZATION WITH CORONARY ANGIOGRAM;  Surgeon: Burnell Blanks, MD;  Location[facility] CATH LAB;  Service: Cardiovascular;  Laterality: N/A;  . PACEMAKER INSERTION    . PILONIDAL CYST EXCISION       Current Outpatient Prescriptions  Medication Sig Dispense Refill  . acetaminophen-codeine (TYLENOL #3) 300-30 MG tablet Take 1 tablet by mouth every 8 (eight) hours as needed. for pain 90 tablet 2  . ALPRAZolam (XANAX) 0.25 MG tablet Take 0.25 mg by mouth every 8 (eight) hours as needed for anxiety.     Marland Kitchen anastrozole (ARIMIDEX) 1 MG tablet Take 1 tablet (1 mg total) by mouth daily. 90 tablet 3  . clindamycin (CLEOCIN) 150 MG capsule Take by mouth as needed. 4 capsules 1 hour prior to dental work  2  . ELIQUIS 2.5 MG TABS tablet TAKE 1 TABLET BY MOUTH TWICE A DAY 180 tablet 2  . famotidine (PEPCID) 20 MG tablet Take 1 tablet (20 mg total) by mouth 2 (two) times daily. 10 tablet 0  . hydrochlorothiazide 25 MG tablet Take  25 mg by mouth daily.     Marland Kitchen KLOR-CON M10 10 MEQ tablet TAKE 1 TABLET BY MOUTH EVERY DAY 90 tablet 1  . LYRICA 75 MG capsule Take 75 mg by mouth 2 (two) times daily.    . metoprolol succinate (TOPROL-XL) 50 MG 24 hr tablet TAKE 1 TABLET BY MOUTH ONCE DAILY 90 tablet 3  . nitroGLYCERIN (NITROSTAT) 0.4 MG SL tablet Place 1 tablet (0.4 mg total) under the tongue every 5 (five) minutes as needed for chest pain. 25 tablet 6  . nortriptyline (PAMELOR) 10 MG capsule Take 10 mg by mouth at bedtime.    . ondansetron (ZOFRAN ODT) 8 MG disintegrating tablet Take 1 tablet (8 mg total) by mouth every 8 (eight) hours as needed for nausea or vomiting. 20 tablet 0  . quinapril (ACCUPRIL) 40 MG tablet Take 40 mg by mouth 2 (two) times daily.    Marland Kitchen senna (SENOKOT) 8.6 MG TABS tablet Take 2 tablets by mouth at bedtime.    . simvastatin (ZOCOR) 40 MG tablet Take 40 mg by mouth at bedtime.     .  Vitamin D, Ergocalciferol, (DRISDOL) 50000 UNITS CAPS capsule Take 50,000 Units by mouth every 7 (seven) days.     Marland Kitchen zolpidem (AMBIEN) 10 MG tablet Take 10 mg by mouth at bedtime.     No current facility-administered medications for this visit.     Allergies:   Macrodantin; Penicillins; and Tolectin [tolmetin sodium]    Social History:  The patient  reports that she has never smoked. She has never used smokeless tobacco. She reports that she does not drink alcohol or use drugs.   Family History:  The patient's family history includes Cancer in her mother; Heart attack in her sister; Heart failure in her father.    ROS:  Please see the history of present illness.    Review of Systems: Constitutional:  denies fever, chills, diaphoresis, appetite change and fatigue.  HEENT: denies photophobia, eye pain, redness, hearing loss, ear pain, congestion, sore throat, rhinorrhea, sneezing, neck pain, neck stiffness and tinnitus.  Respiratory: denies SOB, DOE, cough, chest tightness, and wheezing.  Cardiovascular: admits to chest pain,   Gastrointestinal: denies nausea, vomiting, abdominal pain, diarrhea, constipation, blood in stool.  Genitourinary: denies dysuria, urgency, frequency, hematuria, flank pain and difficulty urinating.  Musculoskeletal: denies  myalgias, back pain, joint swelling, arthralgias and gait problem.   Skin: denies pallor, rash and wound.  Neurological: denies dizziness, seizures, syncope, weakness, light-headedness, numbness and headaches.   Hematological: denies adenopathy, easy bruising, personal or family bleeding history.  Psychiatric/ Behavioral: denies suicidal ideation, mood changes, confusion, nervousness, sleep disturbance and agitation.       All other systems are reviewed and negative.    PHYSICAL EXAM: VS:  BP (!) 144/56 (BP Location: Right Arm, Patient Position: Sitting, Cuff Size: Normal)   Pulse 68   Ht 4\' 10"  (1.473 m)   Wt 138 lb 6.4 oz (62.8 kg)    BMI 28.93 kg/m  , BMI Body mass index is 28.93 kg/m. GEN: Well nourished, well developed, in no acute distress , elderly  HEENT: normal  Neck: no JVD,  Has bilateral carotid bruits,  Cardiac: RRR; 3/6 systolic  murmur, rubs, or gallops,no edema  Respiratory:  clear to auscultation bilaterally, normal work of breathing GI: soft, nontender, nondistended, + BS MS: no deformity or atrophy  Skin: warm and dry, no rash Neuro:  Strength and sensation are intact Psych: normal   EKG:  EKG  is ordered today. The ekg ordered today demonstrates AV pacing .    Recent Labs: 01/22/2016: ALT 11; BUN 20.9; Creatinine 1.1; HGB 12.6; Platelets 217; Potassium 4.3; Sodium 139    Lipid Panel    Component Value Date/Time   CHOL 87 08/04/2011 0610   TRIG 157 (H) 08/04/2011 0610   HDL 28 (L) 08/04/2011 0610   CHOLHDL 3.1 08/04/2011 0610   VLDL 31 08/04/2011 0610   LDLCALC 28 08/04/2011 0610      Wt Readings from Last 3 Encounters:  02/14/16 138 lb 6.4 oz (62.8 kg)  01/22/16 136 lb 11.2 oz (62 kg)  08/01/15 139 lb 6.4 oz (63.2 kg)      Other studies Reviewed: Additional studies/ records that were reviewed today include: . Review of the above records demonstrates:    ASSESSMENT AND PLAN:   1. Hypertension - BP is well controlled typically.   Its a bit higher today . She will watch her salt intake .  2. Aortic stenosis - severe , mean gradient of 42, peak gradient of 69. - She has a history of moderate to severe aortic stenosis for many years.   She still has no symptoms .  Discussed TAVR again today .    Will get an echo ( previous one was in 2014. Given her age, we may still elect conservative therapy and not refer for TAVR but we will make that decision at that time. .  3. Chest pain - no recent cp.  Seems to be doing well   4. Pre-canerous breast mass 5. Macular degeneration 6. Pacer -St. Jude dual- chamber pacemaker 08/29/10  Current medicines are reviewed at length with the  patient today.  The patient does not have concerns regarding medicines.  The following changes have been made:  no change  Disposition:   FU with me in 6 months     Signed, Mertie Moores, MD  02/14/2016 2:40 PM    [facility] [address], [place], [place]  [address] Phone: [phone]; Fax: [phone]

## 2016-02-23 ENCOUNTER — Encounter: Payer: Self-pay | Admitting: Physical Medicine & Rehabilitation

## 2016-02-23 ENCOUNTER — Encounter: Payer: Federal, State, Local not specified - PPO | Attending: Physical Medicine & Rehabilitation

## 2016-02-23 ENCOUNTER — Ambulatory Visit (HOSPITAL_BASED_OUTPATIENT_CLINIC_OR_DEPARTMENT_OTHER): Payer: Federal, State, Local not specified - PPO | Admitting: Physical Medicine & Rehabilitation

## 2016-02-23 VITALS — BP 158/62 | HR 69 | Resp 16

## 2016-02-23 DIAGNOSIS — M25551 Pain in right hip: Secondary | ICD-10-CM | POA: Diagnosis present

## 2016-02-23 DIAGNOSIS — M7061 Trochanteric bursitis, right hip: Secondary | ICD-10-CM | POA: Insufficient documentation

## 2016-02-23 NOTE — Patient Instructions (Signed)
Cortisone injection of the right hip today with Celestone and lidocaine. We'll see back in 3 months, repeat

## 2016-02-23 NOTE — Progress Notes (Signed)
Right Trochanteric bursa injection With  ultrasound guidance  Indication Trochanteric bursitis. Exam has tenderness over the greater trochanter of the hip. Pain has not responded to conservative care such as exercise therapy and oral medications. Pain interferes with sleep or with mobility, Ultrasound guided injections have been more effective in this patient and has enabled the targeting of the posterior facet Informed consent was obtained after describing risks and benefits of the procedure with the patient these include bleeding bruising and infection. Patient has signed written consent form. Patient placed in a lateral decubitus position with the affected hip superior. Point of maximal pain was palpated marked and prepped with Betadine and entered with a needle to bone contact,posterior facet targeted. Needle slightly withdrawn then 6mg  of betamethasone with 4 cc 1% lidocaine were injected. Patient tolerated procedure well. Post procedure instructions given.

## 2016-04-05 ENCOUNTER — Other Ambulatory Visit: Payer: Self-pay

## 2016-04-05 ENCOUNTER — Ambulatory Visit (HOSPITAL_COMMUNITY): Payer: Federal, State, Local not specified - PPO | Attending: Internal Medicine

## 2016-04-05 DIAGNOSIS — I340 Nonrheumatic mitral (valve) insufficiency: Secondary | ICD-10-CM | POA: Diagnosis not present

## 2016-04-05 DIAGNOSIS — I371 Nonrheumatic pulmonary valve insufficiency: Secondary | ICD-10-CM | POA: Diagnosis not present

## 2016-04-05 DIAGNOSIS — I350 Nonrheumatic aortic (valve) stenosis: Secondary | ICD-10-CM | POA: Diagnosis not present

## 2016-04-16 MED ORDER — FENTANYL CITRATE (PF) 100 MCG/2ML IJ SOLN
INTRAMUSCULAR | Status: AC
  Filled 2016-04-16: qty 2

## 2016-04-27 ENCOUNTER — Other Ambulatory Visit: Payer: Self-pay | Admitting: Cardiovascular Disease

## 2016-04-27 DIAGNOSIS — I25110 Atherosclerotic heart disease of native coronary artery with unstable angina pectoris: Secondary | ICD-10-CM

## 2016-05-21 ENCOUNTER — Ambulatory Visit: Payer: Federal, State, Local not specified - PPO | Admitting: Physical Medicine & Rehabilitation

## 2016-05-21 ENCOUNTER — Encounter: Payer: Federal, State, Local not specified - PPO | Attending: Physical Medicine & Rehabilitation

## 2016-06-06 ENCOUNTER — Other Ambulatory Visit: Payer: Self-pay | Admitting: Cardiovascular Disease

## 2016-06-06 NOTE — Telephone Encounter (Signed)
Pt overdue for appt with Dr. Acie Fredrickson; please call the office to make appt.

## 2016-06-10 ENCOUNTER — Other Ambulatory Visit (INDEPENDENT_AMBULATORY_CARE_PROVIDER_SITE_OTHER): Payer: Self-pay | Admitting: Orthopaedic Surgery

## 2016-06-13 ENCOUNTER — Telehealth (INDEPENDENT_AMBULATORY_CARE_PROVIDER_SITE_OTHER): Payer: Self-pay | Admitting: Orthopaedic Surgery

## 2016-06-13 NOTE — Telephone Encounter (Signed)
Would you like to fill Rx? Please advise

## 2016-06-13 NOTE — Telephone Encounter (Signed)
See other message. Pending on Dr Phoebe Sharps response

## 2016-06-13 NOTE — Telephone Encounter (Signed)
[facility] called to see of we received request RX refill for tylenol [phone]

## 2016-06-14 ENCOUNTER — Telehealth: Payer: Self-pay | Admitting: *Deleted

## 2016-06-14 ENCOUNTER — Telehealth (INDEPENDENT_AMBULATORY_CARE_PROVIDER_SITE_OTHER): Payer: Self-pay | Admitting: Orthopaedic Surgery

## 2016-06-14 MED ORDER — ACETAMINOPHEN-CODEINE #3 300-30 MG PO TABS: 1.0000 | ORAL_TABLET | Freq: Three times a day (TID) | ORAL | 0 refills | Status: DC | PRN

## 2016-06-14 NOTE — Telephone Encounter (Signed)
Please advise 

## 2016-06-14 NOTE — Telephone Encounter (Signed)
Ok thanks.  I wasn't aware

## 2016-06-14 NOTE — Telephone Encounter (Signed)
Ariday called for a refill on her Tyl #3.  It looks like she had called Dr Erlinda Hong office for refill and an RX was printed but I have verified with pharmacy and [place] that it was not filled. I spoke with Reeves Forth and she has her (old)bottle with Dr Phoebe Sharps name on it.  It looks like by Vivian that the last fill was off of an RX given to her in June 2017.  I have explained to Aasia that Dr Letta Pate is to be the only prescriber for her pain medication and she understands.  She has an appt 07/18/16 with Dr Letta Pate and she is to keep the appt.  I have called in one month refill on her Tyl #3 (90 tabs) to [facility], and notified Dr Phoebe Sharps office that she is under contract with Dr Letta Pate.

## 2016-07-12 ENCOUNTER — Other Ambulatory Visit (INDEPENDENT_AMBULATORY_CARE_PROVIDER_SITE_OTHER): Payer: Self-pay | Admitting: Orthopaedic Surgery

## 2016-07-18 ENCOUNTER — Encounter: Payer: Federal, State, Local not specified - PPO | Attending: Physical Medicine & Rehabilitation

## 2016-07-18 ENCOUNTER — Other Ambulatory Visit: Payer: Self-pay | Admitting: Physical Medicine & Rehabilitation

## 2016-07-18 ENCOUNTER — Ambulatory Visit: Payer: Federal, State, Local not specified - PPO | Admitting: Physical Medicine & Rehabilitation

## 2016-07-18 DIAGNOSIS — M7061 Trochanteric bursitis, right hip: Secondary | ICD-10-CM | POA: Insufficient documentation

## 2016-07-18 NOTE — Telephone Encounter (Signed)
Acetaminophen w codeine 3 - needs refill

## 2016-07-19 MED ORDER — ACETAMINOPHEN-CODEINE #3 300-30 MG PO TABS: 1.0000 | ORAL_TABLET | Freq: Three times a day (TID) | ORAL | 0 refills | Status: DC | PRN

## 2016-07-19 NOTE — Telephone Encounter (Signed)
Continue make sure she did not fill the prescription from Dr. Erlinda Hong We can give her a one-month supply of her Tylenol with Codeine, but this would be the last one without appointment

## 2016-07-19 NOTE — Telephone Encounter (Signed)
Verified and called Rx to pharmacy. Notified Mrs Fendt.

## 2016-07-19 NOTE — Telephone Encounter (Signed)
Erin Charles has not been seen since August. We gave her a refill in December and she was supposed to be seen 07/18/16 but moved to 07/26/16.  Do you want to give her another refill? (last filled 06/14/16)

## 2016-07-24 ENCOUNTER — Other Ambulatory Visit: Payer: Medicare Other

## 2016-07-24 ENCOUNTER — Ambulatory Visit: Payer: Medicare Other | Admitting: Hematology

## 2016-07-26 ENCOUNTER — Ambulatory Visit (HOSPITAL_BASED_OUTPATIENT_CLINIC_OR_DEPARTMENT_OTHER): Payer: Federal, State, Local not specified - PPO | Admitting: Physical Medicine & Rehabilitation

## 2016-07-26 DIAGNOSIS — M7061 Trochanteric bursitis, right hip: Secondary | ICD-10-CM | POA: Diagnosis not present

## 2016-07-26 MED ORDER — ACETAMINOPHEN-CODEINE #3 300-30 MG PO TABS: 1.0000 | ORAL_TABLET | Freq: Three times a day (TID) | ORAL | 1 refills | Status: DC | PRN

## 2016-07-26 NOTE — Patient Instructions (Signed)
You need to get all of your Tylenol with Codeine prescriptions from our office. Do not have other doctors get to this medicine. This is to prevent you from taking too much Call our office if you are running low on medication Repeat hip injection in 3 months

## 2016-07-26 NOTE — Progress Notes (Signed)
Right Trochanteric bursa injection With or without ultrasound guidance  Indication Trochanteric bursitis. Exam has tenderness over the greater trochanter of the hip. Pain has not responded to conservative care such as exercise therapy and oral medications. Pain interferes with sleep or with mobility Informed consent was obtained after describing risks and benefits of the procedure with the patient these include bleeding bruising and infection. Patient has signed written consent form. Patient placed in a lateral decubitus position with the affected hip superior. Point of maximal pain was palpated marked and prepped with Betadine and entered with a needle to bone contact. Needle slightly withdrawn then 6mg  of betamethasone with 4 cc 1% lidocaine were injected. Patient tolerated procedure well. Post procedure instructions given.

## 2016-07-29 ENCOUNTER — Other Ambulatory Visit: Payer: Self-pay | Admitting: Cardiovascular Disease

## 2016-08-08 ENCOUNTER — Encounter: Payer: Self-pay | Admitting: Cardiovascular Disease

## 2016-08-13 ENCOUNTER — Ambulatory Visit (INDEPENDENT_AMBULATORY_CARE_PROVIDER_SITE_OTHER): Payer: Federal, State, Local not specified - PPO | Admitting: Cardiovascular Disease

## 2016-08-13 ENCOUNTER — Encounter: Payer: Self-pay | Admitting: Cardiovascular Disease

## 2016-08-13 VITALS — BP 136/60 | HR 70 | Ht <= 58 in | Wt 126.0 lb

## 2016-08-13 DIAGNOSIS — I350 Nonrheumatic aortic (valve) stenosis: Secondary | ICD-10-CM | POA: Diagnosis not present

## 2016-08-13 DIAGNOSIS — E782 Mixed hyperlipidemia: Secondary | ICD-10-CM | POA: Insufficient documentation

## 2016-08-13 LAB — COMPREHENSIVE METABOLIC PANEL
A/G RATIO: 2 (ref 1.2–2.2)
ALBUMIN: 4.5 g/dL (ref 3.2–4.6)
ALK PHOS: 101 IU/L (ref 39–117)
ALT: 18 IU/L (ref 0–32)
AST: 27 IU/L (ref 0–40)
BILIRUBIN TOTAL: 0.5 mg/dL (ref 0.0–1.2)
BUN/Creatinine Ratio: 16 (ref 12–28)
BUN: 19 mg/dL (ref 10–36)
CO2: 24 mmol/L (ref 18–29)
CREATININE: 1.19 mg/dL — AB (ref 0.57–1.00)
Calcium: 10.3 mg/dL (ref 8.7–10.3)
Chloride: 95 mmol/L — ABNORMAL LOW (ref 96–106)
GFR calc Af Amer: 46 mL/min/{1.73_m2} — ABNORMAL LOW (ref >59)
GFR calc non Af Amer: 40 mL/min/{1.73_m2} — ABNORMAL LOW (ref >59)
GLOBULIN, TOTAL: 2.3 g/dL (ref 1.5–4.5)
Glucose: 124 mg/dL — ABNORMAL HIGH (ref 65–99)
POTASSIUM: 4 mmol/L (ref 3.5–5.2)
SODIUM: 140 mmol/L (ref 134–144)
Total Protein: 6.8 g/dL (ref 6.0–8.5)

## 2016-08-13 LAB — LIPID PANEL
CHOLESTEROL TOTAL: 131 mg/dL (ref 100–199)
Chol/HDL Ratio: 2.5 ratio units (ref 0.0–4.4)
HDL: 52 mg/dL (ref >39)
LDL CALC: 50 mg/dL (ref 0–99)
TRIGLYCERIDES: 147 mg/dL (ref 0–149)
VLDL CHOLESTEROL CAL: 29 mg/dL (ref 5–40)

## 2016-08-13 NOTE — Patient Instructions (Signed)
Medication Instructions:  Your physician recommends that you continue on your current medications as directed. Please refer to the Current Medication list given to you today.   Labwork: TODAY - cholesterol, complete metabolic panel   Testing/Procedures: None Ordered   Follow-Up: Your physician recommends that you schedule a follow-up appointment in: 3 months with Dr. Nahser   If you need a refill on your cardiac medications before your next appointment, please call your pharmacy.   Thank you for choosing [facility]! Catalaya Garr, RN [phone]    

## 2016-08-13 NOTE — Progress Notes (Signed)
Cardiology Office Note   Date:  08/13/2016   ID:  [name], DOB December 14, 1922, MRN [id number]  PCP:  Erin Pac, MD  Cardiologist:   Mertie Moores, MD   Chief Complaint  Patient presents with  . Follow-up   Problem List: 1. Hypertension 2. Aortic stenosis - severe , mean gradient of 42, peak gradient of 69. 3. Chest pain 4. Pre-canerous breast mass 5. Macular degeneration 6. Pacer -St. Jude dual- chamber pacemaker 08/29/10   History of Present Illness: Erin Charles is a 83 y.o. female who presents for evalualtion of some episodes of chest discomfort. Pt complains of CP - only with walking .  Better with rest.  For the past  Several weeks , worse over the past 3 days.   Last for several minutes.   Mid / left upper chest , radiates around to the back .  Not associated with dyspnea, no sweats, no pre-syncope  Nov 03, 2014: Erin Charles had a cardiac cath last month.  She has no significant CAD and has significant Aortic stenosis.   She has not wanted to go for TAVR - is here to discuss.   Nov. 1, 2016:  Erin Charles is seen today for follow up of aortic stenosis .  No CP or dyspnea.  Walking with a cane.  Limited by her arthritis. .  Jan. 31, 2017:  Doing ok. No CP or dyspnea.    BP is a bit higher today .  Has been better on previous months   Aug . 16, 2017:  Doing well from a cardiac standpoint.  Has a UTI. breathig is good. No CP .    Feb. 13, 2018 Doing well Had a GI bug for the past several weeks .  Seems to be getting over it now   Past Medical History:  Diagnosis Date  . Advanced age   . Aortic stenosis   . Complete heart block ([place])    Pacer  . 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 Dyslipidemia   . HTN (hypertension)   . Macular degeneration   . OA (osteoarthritis)   . RBBB (right bundle branch block)   . Severe aortic stenosis    s/p echo in 2010 with AVA of .77    Past Surgical History:  Procedure  Laterality Date  . EYE SURGERY    . HERNIA REPAIR    . INSERT / REPLACE / REMOVE PACEMAKER  08/30/10   DUAL CHAMBER/ST. JUDE  . LEFT AND RIGHT HEART CATHETERIZATION WITH CORONARY ANGIOGRAM N/A 08/09/2014   Procedure: LEFT AND RIGHT HEART CATHETERIZATION WITH CORONARY ANGIOGRAM;  Surgeon: Burnell Blanks, MD;  Location: [facility] CATH LAB;  Service: Cardiovascular;  Laterality: N/A;  . PACEMAKER INSERTION    . PILONIDAL CYST EXCISION       Current Outpatient Prescriptions  Medication Sig Dispense Refill  . acetaminophen-codeine (TYLENOL #3) 300-30 MG tablet Take 1 tablet by mouth every 8 (eight) hours as needed. 90 tablet 1  . ALPRAZolam (XANAX) 0.25 MG tablet Take 0.25 mg by mouth every 8 (eight) hours as needed for anxiety.     Erin Charles anastrozole (ARIMIDEX) 1 MG tablet Take 1 tablet (1 mg total) by mouth daily. 90 tablet 3  . clindamycin (CLEOCIN) 150 MG capsule Take by mouth as needed. 4 capsules 1 hour prior to dental work  2  . ELIQUIS 2.5 MG TABS tablet TAKE 1 TABLET BY MOUTH TWICE A DAY 180 tablet 2  .  famotidine (PEPCID) 20 MG tablet Take 1 tablet (20 mg total) by mouth 2 (two) times daily. 10 tablet 0  . hydrochlorothiazide 25 MG tablet Take 25 mg by mouth daily.     Erin Charles KLOR-CON M10 10 MEQ tablet TAKE 1 TABLET BY MOUTH EVERY DAY 90 tablet 1  . LYRICA 75 MG capsule Take 75 mg by mouth 2 (two) times daily.    . metoprolol succinate (TOPROL-XL) 50 MG 24 hr tablet TAKE 1 TABLET BY MOUTH DAILY 30 tablet 5  . NITROSTAT 0.4 MG SL tablet PLACE 1 TABLET (0.4 MG TOTAL) UNDER THE TONGUE EVERY 5 (FIVE) MINUTES AS NEEDED FOR CHEST PAIN. 25 tablet 3  . nortriptyline (PAMELOR) 10 MG capsule Take 10 mg by mouth at bedtime.    . ondansetron (ZOFRAN ODT) 8 MG disintegrating tablet Take 1 tablet (8 mg total) by mouth every 8 (eight) hours as needed for nausea or vomiting. 20 tablet 0  . quinapril (ACCUPRIL) 40 MG tablet Take 40 mg by mouth 2 (two) times daily.    Erin Charles senna (SENOKOT) 8.6 MG TABS tablet Take 2  tablets by mouth at bedtime.    . simvastatin (ZOCOR) 40 MG tablet Take 40 mg by mouth at bedtime.     . Vitamin D, Ergocalciferol, (DRISDOL) 50000 UNITS CAPS capsule Take 50,000 Units by mouth every 7 (seven) days.     Erin Charles zolpidem (AMBIEN) 10 MG tablet Take 10 mg by mouth at bedtime.     No current facility-administered medications for this visit.     Allergies:   Macrodantin; Penicillins; and Tolectin [tolmetin sodium]    Social History:  The patient  reports that she has never smoked. She has never used smokeless tobacco. She reports that she does not drink alcohol or use drugs.   Family History:  The patient's family history includes Cancer in her mother; Heart attack in her sister; Heart failure in her father.    ROS:  Please see the history of present illness.    Review of Systems: Constitutional:  denies fever, chills, diaphoresis, appetite change and fatigue.  HEENT: denies photophobia, eye pain, redness, hearing loss, ear pain, congestion, sore throat, rhinorrhea, sneezing, neck pain, neck stiffness and tinnitus.  Respiratory: denies SOB, DOE, cough, chest tightness, and wheezing.  Cardiovascular: admits to chest pain,   Gastrointestinal: denies nausea, vomiting, abdominal pain, diarrhea, constipation, blood in stool.  Genitourinary: denies dysuria, urgency, frequency, hematuria, flank pain and difficulty urinating.  Musculoskeletal: denies  myalgias, back pain, joint swelling, arthralgias and gait problem.   Skin: denies pallor, rash and wound.  Neurological: denies dizziness, seizures, syncope, weakness, light-headedness, numbness and headaches.   Hematological: denies adenopathy, easy bruising, personal or family bleeding history.  Psychiatric/ Behavioral: denies suicidal ideation, mood changes, confusion, nervousness, sleep disturbance and agitation.       All other systems are reviewed and negative.    PHYSICAL EXAM: VS:  BP 136/60 (BP Location: Right Arm, Patient  Position: Sitting, Cuff Size: Normal)   Pulse 70   Ht 4\' 10"  (1.473 m)   Wt 126 lb (57.2 kg)   BMI 26.33 kg/m  , BMI Body mass index is 26.33 kg/m. GEN: Well nourished, well developed, in no acute distress , elderly  HEENT: normal  Neck: no JVD,  Has bilateral carotid bruits,  Cardiac: RRR; 3/6 systolic  murmur, rubs, or gallops,no edema  Respiratory:  clear to auscultation bilaterally, normal work of breathing GI: soft, nontender, nondistended, + BS MS: no deformity or  atrophy  Skin: warm and dry, no rash Neuro:  Strength and sensation are intact Psych: normal   EKG:  EKG is ordered today. The ekg ordered today demonstrates AV pacing .    Recent Labs: 01/22/2016: ALT 11; BUN 20.9; Creatinine 1.1; HGB 12.6; Platelets 217; Potassium 4.3; Sodium 139    Lipid Panel    Component Value Date/Time   CHOL 87 08/04/2011 0610   TRIG 157 (H) 08/04/2011 0610   HDL 28 (L) 08/04/2011 0610   CHOLHDL 3.1 08/04/2011 0610   VLDL 31 08/04/2011 0610   LDLCALC 28 08/04/2011 0610      Wt Readings from Last 3 Encounters:  08/13/16 126 lb (57.2 kg)  02/14/16 138 lb 6.4 oz (62.8 kg)  01/22/16 136 lb 11.2 oz (62 kg)      Other studies Reviewed: Additional studies/ records that were reviewed today include: . Review of the above records demonstrates:    ASSESSMENT AND PLAN:   1. Hypertension - BP is well controlled typically.   Its a bit higher today . She will watch her salt intake .  2. Aortic stenosis - severe , mean gradient of 42, peak gradient of 69. - She has a history of moderate to severe aortic stenosis for many years.   She still has no symptoms .  Discussed TAVR again today .    We all agree that even TAVR would probably be to extensive of a procedure for her      3. Chest pain - no recent cp.  Seems to be doing well   4. Pre-canerous breast mass 5. Macular degeneration 6. Pacer -St. Jude dual- chamber pacemaker 08/29/10  Current medicines are reviewed at length with  the patient today.  The patient does not have concerns regarding medicines.  The following changes have been made:  no change  Disposition:   FU with me in 3 months     Signed, Mertie Moores, MD  08/13/2016 11:04 AM    [facility] [place], [place], [place]  [address] Phone: [phone]; Fax: [phone]

## 2016-08-17 ENCOUNTER — Other Ambulatory Visit: Payer: Self-pay | Admitting: Cardiovascular Disease

## 2016-08-19 ENCOUNTER — Telehealth: Payer: Self-pay | Admitting: Hematology

## 2016-08-19 NOTE — Telephone Encounter (Signed)
dtr called to cxl appts for 2/20. Pt not feeling well. Will r/s at later date

## 2016-08-19 NOTE — Telephone Encounter (Signed)
Pt saw Dr Acie Fredrickson on 08/13/16, labs done that day Creat 1.19, Age 83 y/o, weight 57.2 kg, based on specified criteria Eliquis 2.5mg  BID is the appropriate dosage.

## 2016-08-20 ENCOUNTER — Ambulatory Visit: Payer: Self-pay | Admitting: Hematology

## 2016-08-20 ENCOUNTER — Other Ambulatory Visit: Payer: Federal, State, Local not specified - PPO

## 2016-09-06 ENCOUNTER — Encounter: Payer: Federal, State, Local not specified - PPO | Attending: Physical Medicine & Rehabilitation

## 2016-09-06 ENCOUNTER — Ambulatory Visit: Payer: Federal, State, Local not specified - PPO | Admitting: Physical Medicine & Rehabilitation

## 2016-09-06 DIAGNOSIS — M7061 Trochanteric bursitis, right hip: Secondary | ICD-10-CM | POA: Insufficient documentation

## 2016-09-08 ENCOUNTER — Other Ambulatory Visit: Payer: Self-pay | Admitting: Cardiovascular Disease

## 2016-10-24 ENCOUNTER — Encounter: Payer: Federal, State, Local not specified - PPO | Attending: Physical Medicine & Rehabilitation

## 2016-10-24 ENCOUNTER — Ambulatory Visit (HOSPITAL_BASED_OUTPATIENT_CLINIC_OR_DEPARTMENT_OTHER): Payer: Federal, State, Local not specified - PPO | Admitting: Physical Medicine & Rehabilitation

## 2016-10-24 ENCOUNTER — Encounter: Payer: Self-pay | Admitting: Physical Medicine & Rehabilitation

## 2016-10-24 VITALS — BP 138/67 | HR 70

## 2016-10-24 DIAGNOSIS — M7061 Trochanteric bursitis, right hip: Secondary | ICD-10-CM | POA: Insufficient documentation

## 2016-10-24 DIAGNOSIS — G894 Chronic pain syndrome: Secondary | ICD-10-CM

## 2016-10-24 DIAGNOSIS — Z5181 Encounter for therapeutic drug level monitoring: Secondary | ICD-10-CM

## 2016-10-24 DIAGNOSIS — Z79899 Other long term (current) drug therapy: Secondary | ICD-10-CM

## 2016-10-24 MED ORDER — ACETAMINOPHEN-CODEINE #3 300-30 MG PO TABS: 1.0000 | ORAL_TABLET | Freq: Three times a day (TID) | ORAL | 2 refills | Status: DC | PRN

## 2016-10-24 NOTE — Progress Notes (Signed)
Trochanteric bursa injection With Right Trochanteric bursa injection With  ultrasound guidance  Indication Trochanteric bursitis. Exam has tenderness over the greater trochanter of the hip. Pain has not responded to conservative care such as exercise therapy and oral medications. Pain interferes with sleep or with mobility, Ultrasound guided injections have been more effective in this patient and has enabled the targeting of the posterior facet Informed consent was obtained after describing risks and benefits of the procedure with the patient these include bleeding bruising and infection. Patient has signed written consent form. Patient placed in a lateral decubitus position with the affected hip superior. Point of maximal pain was palpated marked and prepped with Betadine and entered with a needle to bone contact,posterior facet targeted. Needle slightly withdrawn then 6mg  of betamethasone with 4 cc 1% lidocaine were injected. Patient tolerated procedure well. Post procedure instructions given. ultrasound guidance

## 2016-11-05 ENCOUNTER — Encounter: Payer: Self-pay | Admitting: Cardiovascular Disease

## 2016-11-22 ENCOUNTER — Encounter: Payer: Self-pay | Admitting: Cardiovascular Disease

## 2016-11-22 ENCOUNTER — Ambulatory Visit (INDEPENDENT_AMBULATORY_CARE_PROVIDER_SITE_OTHER): Payer: Federal, State, Local not specified - PPO | Admitting: Cardiovascular Disease

## 2016-11-22 VITALS — BP 140/68 | HR 70 | Ht 59.0 in | Wt 115.8 lb

## 2016-11-22 DIAGNOSIS — I350 Nonrheumatic aortic (valve) stenosis: Secondary | ICD-10-CM | POA: Diagnosis not present

## 2016-11-22 DIAGNOSIS — I10 Essential (primary) hypertension: Secondary | ICD-10-CM | POA: Diagnosis not present

## 2016-11-22 NOTE — Progress Notes (Signed)
Cardiology Office Note   Date:  11/22/2016   ID:  Erin Charles, DOB 02/24/1923, MRN [id number]  PCP:  Hulan Fess, MD  Cardiologist:   Mertie Moores, MD   Chief Complaint  Patient presents with  . Follow-up    atoric stenosis   Problem List: 1. Hypertension 2. Aortic stenosis - severe , mean gradient of 42, peak gradient of 69. 3. Chest pain 4. Pre-canerous breast mass 5. Macular degeneration 6. Pacer -St. Jude dual- chamber pacemaker 08/29/10 7. Atrial fib / flutter    History of Present Illness: Erin Charles is a 83 y.o. female who presents for evalualtion of some episodes of chest discomfort. Pt complains of CP - only with walking .  Better with rest.  For the past  Several weeks , worse over the past 3 days.   Last for several minutes.   Mid / left upper chest , radiates around to the back .  Not associated with dyspnea, no sweats, no pre-syncope  Nov 03, 2014: Erin Charles had a cardiac cath last month.  She has no significant CAD and has significant Aortic stenosis.   She has not wanted to go for TAVR - is here to discuss.   Nov. 1, 2016:  Erin Charles is seen today for follow up of aortic stenosis .  No CP or dyspnea.  Walking with a cane.  Limited by her arthritis. .  Jan. 31, 2017:  Doing ok. No CP or dyspnea.    BP is a bit higher today .  Has been better on previous months   Aug . 16, 2017:  Doing well from a cardiac standpoint.  Has a UTI. breathig is good. No CP .    Feb. 13, 2018 Doing well Had a GI bug for the past several weeks .  Seems to be getting over it now   Nov 22, 2016: Doing well Is in atrial flutter  - cannot tell  ,  no palpitations Complains of right thigh pain due to bursitis.  No CP or dyspnea    Past Medical History:  Diagnosis Date  . Advanced age   . Aortic stenosis   . Atrial flutter ([place])   . Complete heart block ([place])    Pacer  . 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 Dyslipidemia     . HTN (hypertension)   . Macular degeneration   . OA (osteoarthritis)   . RBBB (right bundle branch block)   . Severe aortic stenosis    s/p echo in 2010 with AVA of .77    Past Surgical History:  Procedure Laterality Date  . EYE SURGERY    . HERNIA REPAIR    . INSERT / REPLACE / REMOVE PACEMAKER  08/30/10   DUAL CHAMBER/ST. JUDE  . LEFT AND RIGHT HEART CATHETERIZATION WITH CORONARY ANGIOGRAM N/A 08/09/2014   Procedure: LEFT AND RIGHT HEART CATHETERIZATION WITH CORONARY ANGIOGRAM;  Surgeon: Burnell Blanks, MD;  [facility] CATH LAB;  Service: Cardiovascular;  Laterality: N/A;  . PACEMAKER INSERTION    . PILONIDAL CYST EXCISION       Current Outpatient Prescriptions  Medication Sig Dispense Refill  . acetaminophen-codeine (TYLENOL #3) 300-30 MG tablet Take 1 tablet by mouth every 8 (eight) hours as needed. 90 tablet 2  . ALPRAZolam (XANAX) 0.25 MG tablet Take 0.25 mg by mouth every 8 (eight) hours as needed for anxiety.     Marland Kitchen anastrozole (ARIMIDEX) 1 MG tablet Take 1  tablet (1 mg total) by mouth daily. 90 tablet 3  . clindamycin (CLEOCIN) 150 MG capsule Take by mouth as needed. 4 capsules 1 hour prior to dental work  2  . ELIQUIS 2.5 MG TABS tablet TAKE 1 TABLET BY MOUTH TWICE A DAY 180 tablet 1  . famotidine (PEPCID) 20 MG tablet Take 1 tablet (20 mg total) by mouth 2 (two) times daily. 10 tablet 0  . fluticasone (FLONASE) 50 MCG/ACT nasal spray Place 1 spray into both nostrils daily as needed.    . hydrochlorothiazide 25 MG tablet Take 25 mg by mouth daily.     Marland Kitchen KLOR-CON M10 10 MEQ tablet TAKE 1 TABLET BY MOUTH EVERY DAY 90 tablet 1  . KLOR-CON M10 10 MEQ tablet TAKE 1 TABLET BY MOUTH EVERY DAY 90 tablet 3  . LYRICA 75 MG capsule Take 75 mg by mouth 2 (two) times daily.    . metoprolol succinate (TOPROL-XL) 50 MG 24 hr tablet TAKE 1 TABLET BY MOUTH DAILY 30 tablet 5  . NITROSTAT 0.4 MG SL tablet PLACE 1 TABLET (0.4 MG TOTAL) UNDER THE TONGUE EVERY 5 (FIVE) MINUTES AS  NEEDED FOR CHEST PAIN. 25 tablet 3  . nortriptyline (PAMELOR) 10 MG capsule Take 10 mg by mouth at bedtime.    . ondansetron (ZOFRAN ODT) 8 MG disintegrating tablet Take 1 tablet (8 mg total) by mouth every 8 (eight) hours as needed for nausea or vomiting. 20 tablet 0  . quinapril (ACCUPRIL) 40 MG tablet Take 40 mg by mouth 2 (two) times daily.    Marland Kitchen senna (SENOKOT) 8.6 MG TABS tablet Take 2 tablets by mouth at bedtime.    . simvastatin (ZOCOR) 40 MG tablet Take 40 mg by mouth at bedtime.     . Vitamin D, Ergocalciferol, (DRISDOL) 50000 UNITS CAPS capsule Take 50,000 Units by mouth every 7 (seven) days.     Marland Kitchen zolpidem (AMBIEN) 10 MG tablet Take 10 mg by mouth at bedtime.     No current facility-administered medications for this visit.     Allergies:   Macrodantin; Penicillins; and Tolectin [tolmetin sodium]    Social History:  The patient  reports that she has never smoked. She has never used smokeless tobacco. She reports that she does not drink alcohol or use drugs.   Family History:  The patient's family history includes Cancer in her mother; Heart attack in her sister; Heart failure in her father.    ROS:  Please see the history of present illness.    Review of Systems: Constitutional:  denies fever, chills, diaphoresis, appetite change and fatigue.  HEENT: denies photophobia, eye pain, redness, hearing loss, ear pain, congestion, sore throat, rhinorrhea, sneezing, neck pain, neck stiffness and tinnitus.  Respiratory: denies SOB, DOE, cough, chest tightness, and wheezing.  Cardiovascular: admits to chest pain,   Gastrointestinal: denies nausea, vomiting, abdominal pain, diarrhea, constipation, blood in stool.  Genitourinary: denies dysuria, urgency, frequency, hematuria, flank pain and difficulty urinating.  Musculoskeletal: denies  myalgias, back pain, joint swelling, arthralgias and gait problem.   Skin: denies pallor, rash and wound.  Neurological: denies dizziness, seizures,  syncope, weakness, light-headedness, numbness and headaches.   Hematological: denies adenopathy, easy bruising, personal or family bleeding history.  Psychiatric/ Behavioral: denies suicidal ideation, mood changes, confusion, nervousness, sleep disturbance and agitation.       All other systems are reviewed and negative.    PHYSICAL EXAM: VS:  BP 140/68   Pulse 70   Ht 4\' 11"  (  1.499 m)   Wt 115 lb 12.8 oz (52.5 kg)   SpO2 98%   BMI 23.39 kg/m  , BMI Body mass index is 23.39 kg/m. GEN: Well nourished, well developed, in no acute distress , elderly  HEENT: normal  Neck: no JVD,  Has bilateral carotid bruits,  Cardiac: RRR; 3/6 systolic  murmur, rubs, or gallops,no edema  Respiratory:  clear to auscultation bilaterally, normal work of breathing GI: soft, nontender, nondistended, + BS MS: no deformity or atrophy  Skin: warm and dry, no rash Neuro:  Strength and sensation are intact Psych: normal   EKG:  EKG is ordered today. Nov 22, 2016:  Atrial flutter with V pacing  .    Recent Labs: 01/22/2016: HGB 12.6; Platelets 217 08/13/2016: ALT 18; BUN 19; Creatinine, Ser 1.19; Potassium 4.0; Sodium 140    Lipid Panel    Component Value Date/Time   CHOL 131 08/13/2016 1140   TRIG 147 08/13/2016 1140   HDL 52 08/13/2016 1140   CHOLHDL 2.5 08/13/2016 1140   CHOLHDL 3.1 08/04/2011 0610   VLDL 31 08/04/2011 0610   LDLCALC 50 08/13/2016 1140      Wt Readings from Last 3 Encounters:  11/22/16 115 lb 12.8 oz (52.5 kg)  08/13/16 126 lb (57.2 kg)  02/14/16 138 lb 6.4 oz (62.8 kg)      Other studies Reviewed: Additional studies/ records that were reviewed today include: . Review of the above records demonstrates:    ASSESSMENT AND PLAN:   1. Hypertension - BP is well controlled typically.    She will watch her salt intake .  2. Aortic stenosis - severe , mean gradient of 42, peak gradient of 69. - She has a history of moderate to severe aortic stenosis for many years.    She still has no symptoms .  Discussed TAVR again today .    We all agree that even TAVR would probably be to extensive of a procedure for her     3.  Atrial flutter :   She is pacing.  Rate is well controlled.   4. Pre-canerous breast mass 5. Macular degeneration 6. Pacer -St. Jude dual- chamber pacemaker 08/29/10  Current medicines are reviewed at length with the patient today.  The patient does not have concerns regarding medicines.  The following changes have been made:  no change  Disposition:   FU with me in 3 months     Signed, Mertie Moores, MD  11/22/2016 12:08 PM    [address], [place], [place]  [address] Phone: [phone]; Fax: [phone]

## 2016-11-22 NOTE — Patient Instructions (Signed)
Medication Instructions:    Your physician recommends that you continue on your current medications as directed. Please refer to the Current Medication list given to you today.  - If you need a refill on your cardiac medications before your next appointment, please call your pharmacy.   Labwork:  None ordered  Testing/Procedures:  None ordered  Follow-Up:  Your physician recommends that you schedule a follow-up appointment in: 3 months with Dr. Acie Fredrickson.  Thank you for choosing [facility]!!

## 2016-12-03 ENCOUNTER — Telehealth: Payer: Self-pay

## 2016-12-03 NOTE — Telephone Encounter (Signed)
Daughter Arbie Cookey has called stating "Erin Charles has had increasing pain. Would like mom to be seen before her July appointment or any alternatives that she could do to help ease  mom pain".  Arbie Cookey would like a call back.  Please advise

## 2016-12-04 ENCOUNTER — Telehealth: Payer: Self-pay

## 2016-12-04 NOTE — Telephone Encounter (Signed)
Patient has been scheduled  with NP on 12/05/2016 @ 1pm

## 2016-12-04 NOTE — Telephone Encounter (Signed)
Its her chronic hip pain

## 2016-12-04 NOTE — Telephone Encounter (Signed)
May schedule with either myself or Erin Charles to eval, is this her chronic hip pain or something new?

## 2016-12-05 ENCOUNTER — Telehealth: Payer: Self-pay | Admitting: Registered Nurse

## 2016-12-05 ENCOUNTER — Encounter
Payer: Federal, State, Local not specified - PPO | Attending: Physical Medicine & Rehabilitation | Admitting: Registered Nurse

## 2016-12-05 ENCOUNTER — Encounter: Payer: Self-pay | Admitting: Registered Nurse

## 2016-12-05 VITALS — BP 144/70 | HR 70 | Resp 14

## 2016-12-05 DIAGNOSIS — Z5181 Encounter for therapeutic drug level monitoring: Secondary | ICD-10-CM | POA: Diagnosis not present

## 2016-12-05 DIAGNOSIS — M48061 Spinal stenosis, lumbar region without neurogenic claudication: Secondary | ICD-10-CM

## 2016-12-05 DIAGNOSIS — M7061 Trochanteric bursitis, right hip: Secondary | ICD-10-CM | POA: Diagnosis not present

## 2016-12-05 DIAGNOSIS — Z79899 Other long term (current) drug therapy: Secondary | ICD-10-CM

## 2016-12-05 DIAGNOSIS — G894 Chronic pain syndrome: Secondary | ICD-10-CM

## 2016-12-05 MED ORDER — ACETAMINOPHEN-CODEINE #3 300-30 MG PO TABS: 1.0000 | ORAL_TABLET | Freq: Three times a day (TID) | ORAL | 2 refills | Status: DC | PRN

## 2016-12-05 NOTE — Telephone Encounter (Signed)
On 12/05/2016 The  [place] was reviewed no conflict was seen on the [place] with multiple prescribers. Erin Charles has a signed narcotic contract with our office. If there were any discrepancies this would have been reported to her physician.

## 2016-12-05 NOTE — Progress Notes (Signed)
Subjective:    Patient ID: Erin Charles, female    DOB: [birth year], 83 y.o.   MRN: [id number]  HPI: Ms. Erin Charles is a 83 year old female who returns for follow up appointment for chronic pain. She states her pain is in her lower back radiating in her right hip. She rates her pain 4. Her current exercise regime is walking.  Ms. Erin Charles daughter called office stating her mom was having lower back pain, Ms. Erin Charles states it's her usual chronic pain. She denies falling, she refuses X-rays. She's scheduled to have right hip injection on 01/23/2017 with Dr. Letta Pate. She would like to keep her current appointment and not to be moved up early.    Pain Inventory Average Pain 7 Pain Right Now 4 My pain is sharp  In the last 24 hours, has pain interfered with the following? General activity 0 Relation with others 0 Enjoyment of life 0 What TIME of day is your pain at its worst? evening Sleep (in general) Good  Pain is worse with: walking Pain improves with: heat/ice and medication Relief from Meds: 7  Mobility walk with assistance use a cane  Function retired  Neuro/Psych trouble walking anxiety  Prior Studies Any changes since last visit?  no  Physicians involved in your care Any changes since last visit?  no   Family History  Problem Relation Age of Onset  . Cancer Mother        Bladder cancer  . Heart failure Father   . Heart attack Sister    Social History   Social History  . Marital status: Married    Spouse name: N/A  . Number of children: N/A  . Years of education: N/A   Social History Main Topics  . Smoking status: Never Smoker  . Smokeless tobacco: Never Used  . Alcohol use No  . Drug use: No  . Sexual activity: Not Currently    Birth control/ protection: Post-menopausal   Other Topics Concern  . None   Social History Narrative  . None   Past Surgical History:  Procedure Laterality Date  . EYE SURGERY    . HERNIA REPAIR    .  INSERT / REPLACE / REMOVE PACEMAKER  08/30/10   DUAL CHAMBER/ST. JUDE  . LEFT AND RIGHT HEART CATHETERIZATION WITH CORONARY ANGIOGRAM N/A 08/09/2014   Procedure: LEFT AND RIGHT HEART CATHETERIZATION WITH CORONARY ANGIOGRAM;  Surgeon: Burnell Blanks, MD;  Location: [facility] CATH LAB;  Service: Cardiovascular;  Laterality: N/A;  . PACEMAKER INSERTION    . PILONIDAL CYST EXCISION     Past Medical History:  Diagnosis Date  . Advanced age   . Aortic stenosis   . Atrial flutter ([place])   . Complete heart block ([place])    Pacer  . 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 Dyslipidemia   . HTN (hypertension)   . Macular degeneration   . OA (osteoarthritis)   . RBBB (right bundle branch block)   . Severe aortic stenosis    s/p echo in 2010 with AVA of .77   BP (!) 144/70 (BP Location: Right Arm, Patient Position: Sitting, Cuff Size: Normal)   Pulse 70   Resp 14   SpO2 97%   Opioid Risk Score:   Fall Risk Score:  `1  Depression screen PHQ 2/9  Depression screen [facility] 2/9 02/10/2015 09/15/2014  Decreased Interest 0 0  Down, Depressed, Hopeless 0 0  PHQ - 2 Score 0  0  Altered sleeping - 0  Tired, decreased energy - 1  Change in appetite - 0  Feeling bad or failure about yourself  - 0  Trouble concentrating - 0  Moving slowly or fidgety/restless - 0  Suicidal thoughts - 0  PHQ-9 Score - 1    Review of Systems  Constitutional: Negative.   HENT: Positive for hearing loss.   Eyes: Negative.   Respiratory: Negative.   Cardiovascular: Negative.   Gastrointestinal: Negative.   Endocrine: Negative.   Genitourinary: Negative.   Musculoskeletal: Positive for arthralgias, back pain and gait problem.  Skin: Negative.   Allergic/Immunologic: Negative.   Hematological: Negative.   Psychiatric/Behavioral: The patient is nervous/anxious.   All other systems reviewed and are negative.      Objective:   Physical Exam  Constitutional: She is oriented to person, place, and  time. She appears well-developed and well-nourished.  HENT:  Head: Normocephalic and atraumatic.  Cardiovascular: Normal rate and regular rhythm.   Pulmonary/Chest: Effort normal and breath sounds normal.  Musculoskeletal:  Normal Muscle Bulk and Muscle Testing Reveals: Upper Extremities: Right: Decreased ROM 90 Degrees and Muscle Strength 4/5 Left: Full ROM and Muscle Strength 5/5 Lumbar Paraspinal Tenderness: L-3-L-5 Right Greater Trochanter Tenderness Lower Extremities: Full ROM and Muscle Strength 5/5 Right Lower Extremity Flexion Produces Pain into Right Lower Extremity Arises from Table slowly using straight cane for support Antalgic Gait   Neurological: She is alert and oriented to person, place, and time.  Skin: Skin is warm and dry.  Psychiatric: She has a normal mood and affect.  Nursing note and vitals reviewed.         Assessment & Plan:  1. Lumbar spinal stenosis and chronic pain: Continue HEP as tolerated.  Refilled: Tylenol #3, one tablet  3 times per day #90. 2. Right Greater Trochanteric Bursitis: Scheduled for Right Hip Injection with Dr. Letta Pate, on 01/23/2017. We will continue the opioid monitoring program, this consists of regular clinic visits, examinations, urine drug screen,pill counts as well as use of [place] Controlled Substance Reporting System.   20 minutes of face to face patient care time was spent during this visit. All questions were encouraged and answered.   F/U in 1 month

## 2017-01-19 ENCOUNTER — Other Ambulatory Visit: Payer: Self-pay | Admitting: Cardiovascular Disease

## 2017-01-23 ENCOUNTER — Ambulatory Visit (HOSPITAL_BASED_OUTPATIENT_CLINIC_OR_DEPARTMENT_OTHER): Payer: Federal, State, Local not specified - PPO | Admitting: Physical Medicine & Rehabilitation

## 2017-01-23 ENCOUNTER — Encounter: Payer: Self-pay | Admitting: Physical Medicine & Rehabilitation

## 2017-01-23 ENCOUNTER — Encounter: Payer: Federal, State, Local not specified - PPO | Attending: Physical Medicine & Rehabilitation

## 2017-01-23 VITALS — BP 147/74 | HR 70 | Resp 14

## 2017-01-23 DIAGNOSIS — M7061 Trochanteric bursitis, right hip: Secondary | ICD-10-CM | POA: Diagnosis present

## 2017-01-23 DIAGNOSIS — Z5181 Encounter for therapeutic drug level monitoring: Secondary | ICD-10-CM

## 2017-01-23 DIAGNOSIS — G894 Chronic pain syndrome: Secondary | ICD-10-CM

## 2017-01-23 DIAGNOSIS — Z79899 Other long term (current) drug therapy: Secondary | ICD-10-CM

## 2017-01-23 NOTE — Patient Instructions (Signed)
Right trochanteric bursa injection under ultrasound. Celestone and lidocaine were injected

## 2017-01-23 NOTE — Progress Notes (Signed)
Right Trochanteric bursa injection With ultrasound guidance  Indication Trochanteric bursitis. Exam has tenderness over the greater trochanter of the hip. Pain has not responded to conservative care such as exercise therapy and oral medications. Pain interferes with sleep or with mobility Informed consent was obtained after describing risks and benefits of the procedure with the patient these include bleeding bruising and infection. Patient has signed written consent form. Patient placed in a lateral decubitus position with the affected hip superior. Point of maximal pain was palpated marked and prepped with Betadine and entered with a needle to bone contact. Needle slightly withdrawn then 6mg of betamethasone with 4 cc 1% lidocaine were injected. Patient tolerated procedure well. Post procedure instructions given.  

## 2017-01-24 ENCOUNTER — Other Ambulatory Visit: Payer: Self-pay | Admitting: Hematology

## 2017-01-24 ENCOUNTER — Other Ambulatory Visit: Payer: Self-pay | Admitting: Cardiovascular Disease

## 2017-01-24 DIAGNOSIS — D0512 Intraductal carcinoma in situ of left breast: Secondary | ICD-10-CM

## 2017-01-24 MED ORDER — ANASTROZOLE 1 MG PO TABS: 1.0000 mg | ORAL_TABLET | Freq: Every day | ORAL | 0 refills | Status: DC

## 2017-01-27 ENCOUNTER — Telehealth: Payer: Self-pay | Admitting: *Deleted

## 2017-01-27 NOTE — Telephone Encounter (Signed)
Today, this nurse received refill for anastrozole signed b Dr. Burr Medico to refill this time plus no additional until F/U.  Patient has not been seen since July 2017.  Will call patient to notify her of this order.  Order was previously sent to [facility] 01-24-2017.

## 2017-01-28 NOTE — Telephone Encounter (Signed)
Message left on voicemail requesting return call in reference to refills, need to schedule F/U with Dr. Burr Medico.

## 2017-01-28 NOTE — Telephone Encounter (Signed)
Spoke with patient to confirm receipt of today's message.  Answered questions "What type of doctor is this?  How do you spell the name?  Where are you located?"  Oh, now I remember"  Declined offer for phone number.  Thanks, I need to call to schedule appointment."

## 2017-01-29 ENCOUNTER — Telehealth: Payer: Self-pay | Admitting: Hematology

## 2017-01-29 LAB — DRUG TOX MONITOR 1 W/CONF, ORAL FLD
Amphetamines: NEGATIVE ng/mL (ref <10)
BENZODIAZEPINES: NEGATIVE ng/mL (ref <0.50)
BUPRENORPHINE: NEGATIVE ng/mL (ref <0.025)
Barbiturates: NEGATIVE ng/mL (ref <10)
Cocaine: NEGATIVE ng/mL (ref <2.5)
Codeine: 206.4 ng/mL — ABNORMAL HIGH (ref <2.5)
DIHYDROCODEINE: NEGATIVE ng/mL (ref <2.5)
FENTANYL: NEGATIVE ng/mL (ref <0.10)
HEROIN METABOLITE: NEGATIVE ng/mL (ref <1.0)
HYDROCODONE: NEGATIVE ng/mL (ref <2.5)
Hydromorphone: NEGATIVE ng/mL (ref <2.5)
MARIJUANA: NEGATIVE ng/mL (ref <2.5)
MDMA: NEGATIVE ng/mL (ref <10)
MEPROBAMATE: NEGATIVE ng/mL (ref <2.5)
MORPHINE: NEGATIVE ng/mL (ref <2.5)
Meperidine: NEGATIVE ng/mL (ref <5.0)
Methadone: NEGATIVE ng/mL (ref <5.0)
NORHYDROCODONE: NEGATIVE ng/mL (ref <2.5)
Nicotine Metabolite: NEGATIVE ng/mL (ref <5.0)
Noroxycodone: NEGATIVE ng/mL (ref <2.5)
Opiates: POSITIVE ng/mL — AB (ref <2.5)
Oxycodone: NEGATIVE ng/mL (ref <2.5)
Oxymorphone: NEGATIVE ng/mL (ref <2.5)
PHENCYCLIDINE: NEGATIVE ng/mL (ref <10)
PROPOXYPHENE: NEGATIVE ng/mL (ref <5.0)
TRAMADOL: NEGATIVE ng/mL (ref <5.0)
Tapentadol: NEGATIVE ng/mL (ref <5.0)
Zolpidem: 7.1 ng/mL — ABNORMAL HIGH (ref <5.0)
Zolpidem: POSITIVE ng/mL — AB (ref <5.0)

## 2017-01-29 LAB — DRUG TOX ALC METAB W/CON, ORAL FLD: Alcohol Metabolite: NEGATIVE ng/mL (ref <25)

## 2017-01-29 LAB — DRUG TOX METHYLPHEN W/CONF,ORAL FLD: METHYLPHENIDATE: NEGATIVE ng/mL (ref <1.0)

## 2017-01-29 NOTE — Telephone Encounter (Signed)
Scheduled appt per 7/27 sch message - patient is aware of appt time and date - reminder letter sent in the mail.

## 2017-01-30 ENCOUNTER — Telehealth: Payer: Self-pay | Admitting: *Deleted

## 2017-01-30 NOTE — Telephone Encounter (Signed)
Oral swab drug screen was consistent for prescribed medications.  ?

## 2017-02-23 ENCOUNTER — Other Ambulatory Visit: Payer: Self-pay | Admitting: Cardiovascular Disease

## 2017-02-25 ENCOUNTER — Ambulatory Visit: Payer: Federal, State, Local not specified - PPO | Admitting: Cardiovascular Disease

## 2017-03-17 ENCOUNTER — Encounter: Payer: Self-pay | Admitting: Cardiovascular Disease

## 2017-03-18 ENCOUNTER — Encounter: Payer: Self-pay | Admitting: Cardiovascular Disease

## 2017-03-31 ENCOUNTER — Ambulatory Visit: Payer: Federal, State, Local not specified - PPO | Admitting: Hematology

## 2017-03-31 ENCOUNTER — Other Ambulatory Visit: Payer: Federal, State, Local not specified - PPO

## 2017-04-05 ENCOUNTER — Other Ambulatory Visit: Payer: Self-pay | Admitting: Registered Nurse

## 2017-04-08 ENCOUNTER — Other Ambulatory Visit: Payer: Federal, State, Local not specified - PPO

## 2017-04-08 ENCOUNTER — Ambulatory Visit: Payer: Federal, State, Local not specified - PPO | Admitting: Hematology

## 2017-04-24 ENCOUNTER — Other Ambulatory Visit: Payer: Self-pay | Admitting: Registered Nurse

## 2017-04-24 ENCOUNTER — Other Ambulatory Visit: Payer: Self-pay | Admitting: Hematology

## 2017-04-24 DIAGNOSIS — D0512 Intraductal carcinoma in situ of left breast: Secondary | ICD-10-CM

## 2017-04-28 ENCOUNTER — Inpatient Hospital Stay (HOSPITAL_COMMUNITY)
Admission: EM | Admit: 2017-04-28 | Discharge: 2017-05-02 | DRG: 552 | Disposition: A | Discharge status: Skilled Nursing Facility | Payer: Medicare Other | Attending: Internal Medicine | Admitting: Internal Medicine

## 2017-04-28 ENCOUNTER — Emergency Department (HOSPITAL_COMMUNITY): Payer: Medicare Other

## 2017-04-28 ENCOUNTER — Encounter (HOSPITAL_COMMUNITY): Payer: Self-pay

## 2017-04-28 DIAGNOSIS — N200 Calculus of kidney: Secondary | ICD-10-CM | POA: Diagnosis present

## 2017-04-28 DIAGNOSIS — I350 Nonrheumatic aortic (valve) stenosis: Secondary | ICD-10-CM | POA: Diagnosis present

## 2017-04-28 DIAGNOSIS — G8929 Other chronic pain: Secondary | ICD-10-CM | POA: Diagnosis present

## 2017-04-28 DIAGNOSIS — H3530 Unspecified macular degeneration: Secondary | ICD-10-CM | POA: Diagnosis present

## 2017-04-28 DIAGNOSIS — E860 Dehydration: Secondary | ICD-10-CM | POA: Diagnosis present

## 2017-04-28 DIAGNOSIS — Z23 Encounter for immunization: Secondary | ICD-10-CM

## 2017-04-28 DIAGNOSIS — Z880 Allergy status to penicillin: Secondary | ICD-10-CM

## 2017-04-28 DIAGNOSIS — I4510 Unspecified right bundle-branch block: Secondary | ICD-10-CM | POA: Diagnosis present

## 2017-04-28 DIAGNOSIS — R748 Abnormal levels of other serum enzymes: Secondary | ICD-10-CM | POA: Diagnosis present

## 2017-04-28 DIAGNOSIS — I442 Atrioventricular block, complete: Secondary | ICD-10-CM | POA: Diagnosis present

## 2017-04-28 DIAGNOSIS — R5381 Other malaise: Secondary | ICD-10-CM | POA: Diagnosis not present

## 2017-04-28 DIAGNOSIS — Z8249 Family history of ischemic heart disease and other diseases of the circulatory system: Secondary | ICD-10-CM

## 2017-04-28 DIAGNOSIS — Z79899 Other long term (current) drug therapy: Secondary | ICD-10-CM

## 2017-04-28 DIAGNOSIS — K219 Gastro-esophageal reflux disease without esophagitis: Secondary | ICD-10-CM | POA: Diagnosis present

## 2017-04-28 DIAGNOSIS — F0390 Unspecified dementia without behavioral disturbance: Secondary | ICD-10-CM | POA: Diagnosis present

## 2017-04-28 DIAGNOSIS — R7989 Other specified abnormal findings of blood chemistry: Secondary | ICD-10-CM

## 2017-04-28 DIAGNOSIS — Z881 Allergy status to other antibiotic agents status: Secondary | ICD-10-CM

## 2017-04-28 DIAGNOSIS — I10 Essential (primary) hypertension: Secondary | ICD-10-CM | POA: Diagnosis present

## 2017-04-28 DIAGNOSIS — I480 Paroxysmal atrial fibrillation: Secondary | ICD-10-CM | POA: Diagnosis present

## 2017-04-28 DIAGNOSIS — M4690 Unspecified inflammatory spondylopathy, site unspecified: Principal | ICD-10-CM | POA: Diagnosis present

## 2017-04-28 DIAGNOSIS — E785 Hyperlipidemia, unspecified: Secondary | ICD-10-CM | POA: Diagnosis present

## 2017-04-28 DIAGNOSIS — T502X5A Adverse effect of carbonic-anhydrase inhibitors, benzothiadiazides and other diuretics, initial encounter: Secondary | ICD-10-CM | POA: Diagnosis present

## 2017-04-28 DIAGNOSIS — E876 Hypokalemia: Secondary | ICD-10-CM | POA: Diagnosis present

## 2017-04-28 DIAGNOSIS — R339 Retention of urine, unspecified: Secondary | ICD-10-CM | POA: Diagnosis present

## 2017-04-28 DIAGNOSIS — E871 Hypo-osmolality and hyponatremia: Secondary | ICD-10-CM | POA: Diagnosis present

## 2017-04-28 DIAGNOSIS — I25110 Atherosclerotic heart disease of native coronary artery with unstable angina pectoris: Secondary | ICD-10-CM | POA: Diagnosis present

## 2017-04-28 DIAGNOSIS — Z7901 Long term (current) use of anticoagulants: Secondary | ICD-10-CM

## 2017-04-28 DIAGNOSIS — Z853 Personal history of malignant neoplasm of breast: Secondary | ICD-10-CM

## 2017-04-28 DIAGNOSIS — Z87440 Personal history of urinary (tract) infections: Secondary | ICD-10-CM

## 2017-04-28 DIAGNOSIS — Z886 Allergy status to analgesic agent status: Secondary | ICD-10-CM

## 2017-04-28 DIAGNOSIS — F419 Anxiety disorder, unspecified: Secondary | ICD-10-CM

## 2017-04-28 DIAGNOSIS — Z950 Presence of cardiac pacemaker: Secondary | ICD-10-CM

## 2017-04-28 DIAGNOSIS — I340 Nonrheumatic mitral (valve) insufficiency: Secondary | ICD-10-CM | POA: Diagnosis not present

## 2017-04-28 DIAGNOSIS — M1990 Unspecified osteoarthritis, unspecified site: Secondary | ICD-10-CM | POA: Diagnosis present

## 2017-04-28 DIAGNOSIS — M549 Dorsalgia, unspecified: Secondary | ICD-10-CM | POA: Diagnosis present

## 2017-04-28 DIAGNOSIS — K5900 Constipation, unspecified: Secondary | ICD-10-CM | POA: Diagnosis not present

## 2017-04-28 DIAGNOSIS — M545 Low back pain: Secondary | ICD-10-CM | POA: Diagnosis not present

## 2017-04-28 DIAGNOSIS — I4892 Unspecified atrial flutter: Secondary | ICD-10-CM | POA: Diagnosis present

## 2017-04-28 DIAGNOSIS — R778 Other specified abnormalities of plasma proteins: Secondary | ICD-10-CM

## 2017-04-28 HISTORY — DX: Atherosclerotic heart disease of native coronary artery without angina pectoris: I25.10

## 2017-04-28 HISTORY — DX: Paroxysmal atrial fibrillation: I48.0

## 2017-04-28 HISTORY — DX: Other chronic pain: G89.29

## 2017-04-28 LAB — URINALYSIS, ROUTINE W REFLEX MICROSCOPIC
BACTERIA UA: NONE SEEN
BILIRUBIN URINE: NEGATIVE
Glucose, UA: NEGATIVE mg/dL
KETONES UR: 20 mg/dL — AB
Leukocytes, UA: NEGATIVE
NITRITE: NEGATIVE
PROTEIN: NEGATIVE mg/dL
SPECIFIC GRAVITY, URINE: 1.006 (ref 1.005–1.030)
pH: 7 (ref 5.0–8.0)

## 2017-04-28 LAB — COMPREHENSIVE METABOLIC PANEL
ALBUMIN: 4.4 g/dL (ref 3.5–5.0)
ALT: 19 U/L (ref 14–54)
AST: 40 U/L (ref 15–41)
Alkaline Phosphatase: 101 U/L (ref 38–126)
Anion gap: 18 — ABNORMAL HIGH (ref 5–15)
BUN: 13 mg/dL (ref 6–20)
CHLORIDE: 78 mmol/L — AB (ref 101–111)
CO2: 25 mmol/L (ref 22–32)
CREATININE: 0.76 mg/dL (ref 0.44–1.00)
Calcium: 9.5 mg/dL (ref 8.9–10.3)
GFR calc Af Amer: >60 mL/min (ref >60)
GFR calc non Af Amer: >60 mL/min (ref >60)
Glucose, Bld: 132 mg/dL — ABNORMAL HIGH (ref 65–99)
POTASSIUM: 2.9 mmol/L — AB (ref 3.5–5.1)
SODIUM: 121 mmol/L — AB (ref 135–145)
Total Bilirubin: 1.4 mg/dL — ABNORMAL HIGH (ref 0.3–1.2)
Total Protein: 6.8 g/dL (ref 6.5–8.1)

## 2017-04-28 LAB — LACTIC ACID, PLASMA: Lactic Acid, Venous: 1.3 mmol/L (ref 0.5–1.9)

## 2017-04-28 LAB — CBC
HCT: 39.3 % (ref 36.0–46.0)
Hemoglobin: 13.9 g/dL (ref 12.0–15.0)
MCH: 29.1 pg (ref 26.0–34.0)
MCHC: 35.4 g/dL (ref 30.0–36.0)
MCV: 82.4 fL (ref 78.0–100.0)
PLATELETS: 295 10*3/uL (ref 150–400)
RBC: 4.77 MIL/uL (ref 3.87–5.11)
RDW: 14 % (ref 11.5–15.5)
WBC: 11.2 10*3/uL — AB (ref 4.0–10.5)

## 2017-04-28 LAB — MAGNESIUM: Magnesium: 1.4 mg/dL — ABNORMAL LOW (ref 1.7–2.4)

## 2017-04-28 LAB — LIPASE, BLOOD: Lipase: 52 U/L — ABNORMAL HIGH (ref 11–51)

## 2017-04-28 LAB — TROPONIN I: TROPONIN I: 0.05 ng/mL — AB (ref <0.03)

## 2017-04-28 MED ORDER — MAGNESIUM SULFATE IN D5W 1-5 GM/100ML-% IV SOLN
1.0000 g | Freq: Once | INTRAVENOUS | Status: AC
  Administered 2017-04-28: 1 g via INTRAVENOUS
  Filled 2017-04-28: qty 100

## 2017-04-28 MED ORDER — POTASSIUM CHLORIDE 10 MEQ/100ML IV SOLN
10.0000 meq | Freq: Once | INTRAVENOUS | Status: AC
  Administered 2017-04-28: 10 meq via INTRAVENOUS
  Filled 2017-04-28: qty 100

## 2017-04-28 MED ORDER — MORPHINE SULFATE (PF) 4 MG/ML IV SOLN
4.0000 mg | Freq: Once | INTRAVENOUS | Status: AC
  Administered 2017-04-28: 4 mg via INTRAVENOUS
  Filled 2017-04-28: qty 1

## 2017-04-28 MED ORDER — ONDANSETRON HCL 4 MG/2ML IJ SOLN
4.0000 mg | Freq: Once | INTRAMUSCULAR | Status: AC
  Administered 2017-04-28: 4 mg via INTRAVENOUS
  Filled 2017-04-28: qty 2

## 2017-04-28 MED ORDER — IOPAMIDOL (ISOVUE-300) INJECTION 61%
INTRAVENOUS | Status: AC
  Filled 2017-04-28: qty 100

## 2017-04-28 MED ORDER — IOPAMIDOL (ISOVUE-300) INJECTION 61%
100.0000 mL | Freq: Once | INTRAVENOUS | Status: AC | PRN
  Administered 2017-04-28: 100 mL via INTRAVENOUS

## 2017-04-28 MED ORDER — MAGNESIUM SULFATE 50 % IJ SOLN: 1.0000 g | Freq: Once | INTRAMUSCULAR | Status: DC

## 2017-04-28 NOTE — ED Provider Notes (Signed)
[facility] DEPT Provider Note   CSN: [id number] Arrival date & time: 04/28/17  1954     History   Chief Complaint Chief Complaint  Patient presents with  . Flank Pain    HPI Erin Charles is a 83 y.o. female history of hypertension, severe aortic stenosis, St. 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 States she last urinated this morning with some discomfort. No fevers, chills, nausea, vomiting, diarrhea, constipation. Last BM this morning.   HPI  Past Medical History:  Diagnosis Date  . Advanced age   . Aortic stenosis   . Atrial flutter ([place])   . Complete heart block ([employer])    Pacer  . 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 Dyslipidemia   . HTN (hypertension)   . Macular degeneration   . OA (osteoarthritis)   . RBBB (right bundle branch block)   . Severe aortic stenosis    s/p echo in 2010 with AVA of .77    Patient Active Problem List   Diagnosis Date Noted  . Hyponatremia 04/28/2017  . Mixed hyperlipidemia 08/13/2016  . Ductal carcinoma in situ (DCIS) of left breast 07/21/2015  . Traumatic ecchymosis of groin 08/18/2014  . Trochanteric bursitis of right hip 03/03/2014  . Osteoarthritis of hip 06/07/2013  . DJD (degenerative joint disease) of knee 06/07/2013  . Encounter for long-term (current) use of other medications 06/07/2013  . Spinal stenosis, lumbar region, without neurogenic claudication 06/07/2013  . Atrial fibrillation ([place]) 03/30/2013  . Atrioventricular block, complete ([place]) 12/12/2011  . Unstable angina ([place]) 08/04/2011  . Hypertension 09/27/2010  . Pacemaker-St.Jude 09/27/2010  . Aortic stenosis 09/27/2010    Past Surgical History:  Procedure Laterality Date  . EYE SURGERY    . HERNIA  REPAIR    . INSERT / REPLACE / REMOVE PACEMAKER  08/30/10   DUAL CHAMBER/ST. JUDE  . LEFT AND RIGHT HEART CATHETERIZATION WITH CORONARY ANGIOGRAM N/A 08/09/2014   Procedure: LEFT AND RIGHT HEART CATHETERIZATION WITH CORONARY ANGIOGRAM;  Surgeon: Burnell Blanks, MD;  Location: [facility] CATH LAB;  Service: Cardiovascular;  Laterality: N/A;  . PACEMAKER INSERTION    . PILONIDAL CYST EXCISION      OB History    No data available       Home Medications    Prior to Admission medications   Medication Sig Start Date End Date Taking? Authorizing Provider  acetaminophen-codeine (TYLENOL #3) 300-30 MG tablet TAKE 1 TABLET BY MOUTH EVERY 8 HOURS AS NEEDED Patient taking differently: TAKE 1 TABLET BY MOUTH EVERY 8 HOURS AS NEEDED for pain 04/08/17  Yes Kirsteins, Luanna Salk, MD  ALPRAZolam Duanne Moron) 0.25 MG tablet Take 0.25 mg by mouth every 8 (eight) hours as needed for anxiety.    Yes [provider]  clindamycin (CLEOCIN) 150 MG capsule Take by mouth as needed. 4 capsules 1 hour prior to dental work 07/18/14  Yes [provider]  ELIQUIS 2.5 MG TABS tablet TAKE 1 TABLET BY MOUTH TWICE A DAY 02/24/17  Yes Nahser, Wonda Cheng, MD  famotidine (PEPCID) 20 MG tablet Take 1 tablet (20 mg total) by mouth 2 (two) times daily. 10/30/15  Yes Ward, Ozella Almond, PA-C  fluticasone (FLONASE) 50 MCG/ACT nasal spray Place 1 spray into both nostrils daily as needed for  allergies.    Yes [provider]  hydrochlorothiazide 25 MG tablet Take 25 mg by mouth daily.    Yes Nahser, Wonda Cheng, MD  KLOR-CON M10 10 MEQ tablet TAKE 1 TABLET BY MOUTH EVERY DAY 09/25/15  Yes Nahser, Wonda Cheng, MD  LYRICA 75 MG capsule Take 75 mg by mouth 2 (two) times daily. 07/29/14  Yes [provider]  metoprolol succinate (TOPROL-XL) 50 MG 24 hr tablet TAKE 1 TABLET BY MOUTH DAILY 01/20/17  Yes Nahser, Wonda Cheng, MD  NITROSTAT 0.4 MG SL tablet PLACE 1 TABLET (0.4 MG TOTAL) UNDER THE TONGUE EVERY 5 (FIVE) MINUTES AS  NEEDED FOR CHEST PAIN. 04/29/16  Yes Nahser, Wonda Cheng, MD  nortriptyline (PAMELOR) 10 MG capsule Take 10 mg by mouth at bedtime.   Yes [provider]  ondansetron (ZOFRAN ODT) 8 MG disintegrating tablet Take 1 tablet (8 mg total) by mouth every 8 (eight) hours as needed for nausea or vomiting. 03/01/15  Yes Lacretia Leigh, MD  quinapril (ACCUPRIL) 40 MG tablet Take 40 mg by mouth daily.    Yes [provider]  simvastatin (ZOCOR) 40 MG tablet Take 40 mg by mouth at bedtime.    Yes Nahser, Wonda Cheng, MD  traMADol (ULTRAM) 50 MG tablet Take 50 mg by mouth every 6 (six) hours as needed for moderate pain.  04/23/17  Yes [provider]  Vitamin D, Ergocalciferol, (DRISDOL) 50000 UNITS CAPS capsule Take 50,000 Units by mouth every 7 (seven) days.  07/07/13  Yes [provider]  zolpidem (AMBIEN) 10 MG tablet Take 10 mg by mouth at bedtime. 09/15/13  Yes [provider]  anastrozole (ARIMIDEX) 1 MG tablet Take 1 tablet (1 mg total) by mouth daily. Patient not taking: Reported on 04/28/2017 01/24/17   Truitt Merle, MD    Family History Family History  Problem Relation Age of Onset  . Cancer Mother        Bladder cancer  . Heart failure Father   . Heart attack Sister     Social History Social History  Substance Use Topics  . Smoking status: Never Smoker  . Smokeless tobacco: Never Used  . Alcohol use No     Allergies   Macrodantin; Penicillins; and Tolectin [tolmetin sodium]   Review of Systems Review of Systems  Constitutional: Negative for chills and fever.  Respiratory: Negative for cough and shortness of breath.   Cardiovascular: Negative for chest pain.  Gastrointestinal: Positive for abdominal pain and nausea. Negative for constipation, diarrhea and vomiting.  Genitourinary: Positive for difficulty urinating and dysuria. Negative for flank pain.  Musculoskeletal: Positive for back pain (chronic).  Allergic/Immunologic: Positive for  immunocompromised state.  Neurological: Negative for light-headedness.     Physical Exam Updated Vital Signs BP (!) 174/100   Pulse 75   Temp 99.5 F (37.5 C) (Rectal)   Resp (!) 23   SpO2 98%   Physical Exam  Constitutional: She is oriented to person, place, and time. She appears well-developed and well-nourished. No distress.  NAD.  HENT:  Head: Normocephalic and atraumatic.  Right Ear: External ear normal.  Left Ear: External ear normal.  Nose: Nose normal.  Eyes: Conjunctivae and EOM are normal. No scleral icterus.  Neck: Normal range of motion. Neck supple.  Cardiovascular: Normal rate, regular rhythm and normal heart sounds.   No murmur heard. Pulses:      Radial pulses are 2+ on the right side, and 2+ on the left side.  Dorsalis pedis pulses are 2+ on the right side, and 2+ on the left side.  No lower extremity edema  Pulmonary/Chest: Effort normal and breath sounds normal. She has no wheezes.  Abdominal: Soft. She exhibits no distension. There is tenderness.  Mild epigastric tenderness. Diffuse lower abdominal tenderness, most significant suprapubic. No CVA tenderness. No guarding, rigidity, rebound.  Musculoskeletal: Normal range of motion. She exhibits tenderness. She exhibits no deformity.  Diffuse midline and paraspinal lumbar tenderness, no step-offs or rashes.  Neurological: She is alert and oriented to person, place, and time.  Skin: Skin is warm and dry. Capillary refill takes less than 2 seconds.  Psychiatric: She has a normal mood and affect. Her behavior is normal. Judgment and thought content normal.  Nursing note and vitals reviewed.    ED Treatments / Results  Labs (all labs ordered are listed, but only abnormal results are displayed) Labs Reviewed  URINALYSIS, ROUTINE W REFLEX MICROSCOPIC - Abnormal; Notable for the following:       Result Value   Color, Urine STRAW (*)    Hgb urine dipstick MODERATE (*)    Ketones, ur 20 (*)    Squamous  Epithelial / LPF 0-5 (*)    All other components within normal limits  CBC - Abnormal; Notable for the following:    WBC 11.2 (*)    All other components within normal limits  COMPREHENSIVE METABOLIC PANEL - Abnormal; Notable for the following:    Sodium 121 (*)    Potassium 2.9 (*)    Chloride 78 (*)    Glucose, Bld 132 (*)    Total Bilirubin 1.4 (*)    Anion gap 18 (*)    All other components within normal limits  TROPONIN I - Abnormal; Notable for the following:    Troponin I 0.05 (*)    All other components within normal limits  LIPASE, BLOOD - Abnormal; Notable for the following:    Lipase 52 (*)    All other components within normal limits  MAGNESIUM - Abnormal; Notable for the following:    Magnesium 1.4 (*)    All other components within normal limits  URINE CULTURE  LACTIC ACID, PLASMA  LACTIC ACID, PLASMA    EKG  EKG Interpretation  Date/Time:  Monday April 28 2017 20:28:48 EDT Ventricular Rate:  67 PR Interval:    QRS Duration: 177 QT Interval:  485 QTC Calculation: 513 R Axis:   -79 Text Interpretation:  AV dual-paced rhythm Atrial premature complexes in couplets Prolonged PR interval LVH with IVCD, LAD and secondary repol abnrm Confirmed by Blanchie Dessert [phone]) on 04/28/2017 8:33:42 PM       Radiology Dg Chest 2 View  Result Date: 04/28/2017 CLINICAL DATA:  83 year old female mid chest pain. EXAM: CHEST  2 VIEW COMPARISON:  Chest radiograph dated 10/30/2015 FINDINGS: There is emphysematous changes of the lungs. No focal consolidation, pleural effusion, or pneumothorax. There is cardiomegaly. There is atherosclerotic calcification of the aorta. Left pectoral pacemaker device. There is osteopenia with degenerative changes of the spine and shoulders. No acute osseous pathology. IMPRESSION: No active cardiopulmonary disease. Electronically Signed   By: Anner Crete M.D.   On: 04/28/2017 21:31   Ct Abdomen Pelvis W Contrast  Result Date:  04/28/2017 CLINICAL DATA:  Left flank pain and left lower quadrant pain. EXAM: CT ABDOMEN AND PELVIS WITH CONTRAST TECHNIQUE: Multidetector CT imaging of the abdomen and pelvis was performed using the standard protocol following bolus administration of intravenous contrast. CONTRAST:  178mL ISOVUE-300 IOPAMIDOL (ISOVUE-300) INJECTION 61% COMPARISON:  CT abdomen pelvis 03/01/2015 FINDINGS: Lower chest: No pulmonary nodules or pleural effusion. No visible pericardial effusion. Hepatobiliary: Normal hepatic contours and density. No visible biliary dilatation. Normal gallbladder. Pancreas: Normal contours without ductal dilatation. No peripancreatic fluid collection. Spleen: Normal. Adrenals/Urinary Tract: --Adrenal glands: Normal. --Right kidney/ureter: Lobulated contour, unchanged. Large calculus at the lower pole is also unchanged, measuring 7 mm. --Left kidney/ureter: Unchanged lobulated contour. No hydronephrosis. --Urinary bladder: Unremarkable. Stomach/Bowel: --Stomach/Duodenum: No hiatal hernia or other gastric abnormality. Normal duodenal course and caliber. --Small bowel: No dilatation or inflammation. --Colon: There is rectosigmoid diverticulosis without acute inflammation. --Appendix: Normal. Vascular/Lymphatic: Suprarenal aortic atherosclerotic calcification. No abdominal or pelvic lymphadenopathy. Reproductive: Normal uterus.  No adnexal mass. Musculoskeletal. Severe right hip osteoarthrosis. Multilevel lumbar degenerative disc disease and facet arthrosis. Grade 1 anterolisthesis at L5-S1 due to facet hypertrophy. Other: None. IMPRESSION: 1. No obstructive uropathy. Unchanged large right lower pole renal stone. 2. Rectosigmoid diverticulosis without acute inflammation. 3.  Aortic Atherosclerosis (ICD10-I70.0). Electronically Signed   By: Ulyses Jarred M.D.   On: 04/28/2017 22:42    Procedures Procedures (including critical care time)  Medications Ordered in ED Medications  iopamidol (ISOVUE-300)  61 % injection (not administered)  potassium chloride 10 mEq in 100 mL IVPB (10 mEq Intravenous New Bag/Given 04/28/17 2249)  magnesium sulfate IVPB 1 g 100 mL (1 g Intravenous New Bag/Given 04/28/17 2318)  morphine 4 MG/ML injection 4 mg (4 mg Intravenous Given 04/28/17 2258)  ondansetron (ZOFRAN) injection 4 mg (4 mg Intravenous Given 04/28/17 2253)  iopamidol (ISOVUE-300) 61 % injection 100 mL (100 mLs Intravenous Contrast Given 04/28/17 2208)     Initial Impression / Assessment and Plan / ED Course  I have reviewed the triage vital signs and the nursing notes.  Pertinent labs & imaging results that were available during my care of the patient were reviewed by me and considered in my medical decision making (see chart for details).  Clinical Course as of Apr 29 2335  Mon Apr 28, 2017  2044 Spoke to pt again; states her left "breast pain" is not bad now. Complains of pain in vagina.  [CG]  2157 Troponin I: (!!) 0.05 [CG]  2157 WBC: (!) 11.2 [CG]  2157 Sodium: (!) 121 [CG]  2157 Potassium: (!) 2.9 [CG]    Clinical Course User Index [CG] Kinnie Feil, PA-C    83 year old female presents with urinary retention, suprapubic pain, dysuria. No fevers, chills, vomiting, diarrhea, constipation. Family at bedside states patient began having dysuria 4 days ago, he went to outside clinic and was told she did not have a urinary tract infection. On exam, she is borderline tachycardic. No oral fever. She has diffuse lower abdominal tenderness. In and out Foley with 250 mL urine output.  Patient initially complained of left-sided breast pain however she denied this to supervising physician and when asked about this again she says she is actually not having any breast pain. She does have history of left breast with high-grade DCIS with occasional pain.   Final Clinical Impressions(s) / ED Diagnoses   Labwork remarkable for leukocytosis with WBC 11.2, hyponatremia at 121, hypokalemia at 2.9.  Borderline elevated troponin of 0.05. CT a/p without acute abnormalities. No recent falls. Unclear etiology of sudden new onset retention. Will request admission for questionable UTI with leukocytosis, urinary retention, hyponatremia, hypokalemia and elevated trop.   Final diagnoses:  Hyponatremia  Hypokalemia  Urinary retention    New Prescriptions New  Prescriptions   No medications on file     Arlean Hopping 04/28/17 2336    Blanchie Dessert, MD 05/01/17 2251

## 2017-04-28 NOTE — ED Triage Notes (Signed)
Patient arrives by EMS with complaints of left flank pain radiating to left lower quadrant pain. Patient has dementia and lives with son. Son took patient to primary today and was told they had no way to do labs or UA. Primary told son to take his Mother to the ED. EMS administered Fentanyl 50 mcg and Zofran 4 mg IV. Patient continues to report her pain as 10/10. BP 160/106 RR30 O2 sat 97% RA.

## 2017-04-28 NOTE — ED Notes (Signed)
EDPA made aware patient requesting pain medication

## 2017-04-28 NOTE — ED Notes (Signed)
Patient transported to X-ray 

## 2017-04-28 NOTE — H&P (Signed)
TRH H&P   Patient Demographics:    Erin Charles, is a 83 y.o. female  MRN: [id number]   DOB - July 28, 1922  Admit Date - 04/28/2017  Outpatient Primary MD for the patient is Hulan Fess, MD  Referring MD/NP/PA: Carmon Sails  Outpatient Specialists:   Grayland Jack   Alysia Penna   Patient coming from: home  Chief Complaint  Patient presents with  . Flank Pain      HPI:    Erin Charles  is a 83 y.o. female, w Dementia, Aortic Stenosis (severe), Aflutter, c/o back pain, intractable without radiation.  She is a poor historian due to ? Dementia, pt states that the back pain began today and that she has not had prior back pain but she is obviously on pain medication and her son states that sometimes takes too much.  Pt denies radiation of the pain down her legs.  Pt denies fever, wt loss, incontinence, numbness, tingling, weakness. Pt presented for back pain.    In ED, Wbc 11.2, Hgb 13.9, Plt 295 Na 121, K 2.9 Glucose 132 Ast 40, Alt 19, Alk 101, T. Bili 1.4 Trop 0.05  Magnesium 1.4  Ekg: paced rhythm.   CXR => No active cardiopulmonary disease.  CT scan abd/ pelvis=> IMPRESSION: 1. No obstructive uropathy. Unchanged large right lower pole renal stone. 2. Rectosigmoid diverticulosis without acute inflammation.  Pt will be admitted for intractable back pain, hyponatremia , hypokalemia, hypomagnesemia. Lipase elevation, troponin elevation.                Review of systems:    In addition to the HPI above,  No Fever-chills,  No Headache, No changes with Vision or hearing, No problems swallowing food or Liquids, No Chest pain, Cough or Shortness of Breath, No Abdominal pain, No Nausea or Vommitting, Bowel movements are regular, No Blood in stool or Urine, No dysuria, No new skin rashes or bruises, No new joints pains-aches,  No new  weakness, tingling, numbness in any extremity, No recent weight gain or loss, No polyuria, polydypsia or polyphagia, No significant Mental Stressors.  A full 10 point Review of Systems was done, except as stated above, all other Review of Systems were negative.   With Past History of the following :    Past Medical History:  Diagnosis Date  . Advanced age   . Aortic stenosis   . Atrial flutter ([place])   . Complete heart block ([place])    Pacer  . 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 Dyslipidemia   . HTN (hypertension)   . Macular degeneration   . OA (osteoarthritis)   . RBBB (right bundle branch block)   . Severe aortic stenosis    s/p echo in 2010 with AVA of .77      Past Surgical History:  Procedure Laterality Date  . EYE SURGERY    .  HERNIA REPAIR    . INSERT / REPLACE / REMOVE PACEMAKER  08/30/10   DUAL CHAMBER/ST. JUDE  . LEFT AND RIGHT HEART CATHETERIZATION WITH CORONARY ANGIOGRAM N/A 08/09/2014   Procedure: LEFT AND RIGHT HEART CATHETERIZATION WITH CORONARY ANGIOGRAM;  Surgeon: Burnell Blanks, MD;  Location: [facility] CATH LAB;  Service: Cardiovascular;  Laterality: N/A;  . PACEMAKER INSERTION    . PILONIDAL CYST EXCISION        Social History:     Social History  Substance Use Topics  . Smoking status: Never Smoker  . Smokeless tobacco: Never Used  . Alcohol use No     Lives -  At home Mobility -  Walks with assistance   Family History :     Family History  Problem Relation Age of Onset  . Cancer Mother        Bladder cancer  . Heart failure Father   . Heart attack Sister       Home Medications:   Prior to Admission medications   Medication Sig Start Date End Date Taking? Authorizing Provider  acetaminophen-codeine (TYLENOL #3) 300-30 MG tablet TAKE 1 TABLET BY MOUTH EVERY 8 HOURS AS NEEDED Patient taking differently: TAKE 1 TABLET BY MOUTH EVERY 8 HOURS AS NEEDED for pain 04/08/17  Yes Kirsteins, Luanna Salk, MD    ALPRAZolam Duanne Moron) 0.25 MG tablet Take 0.25 mg by mouth every 8 (eight) hours as needed for anxiety.    Yes [provider]  clindamycin (CLEOCIN) 150 MG capsule Take by mouth as needed. 4 capsules 1 hour prior to dental work 07/18/14  Yes [provider]  ELIQUIS 2.5 MG TABS tablet TAKE 1 TABLET BY MOUTH TWICE A DAY 02/24/17  Yes Nahser, Wonda Cheng, MD  famotidine (PEPCID) 20 MG tablet Take 1 tablet (20 mg total) by mouth 2 (two) times daily. 10/30/15  Yes Ward, Ozella Almond, PA-C  fluticasone (FLONASE) 50 MCG/ACT nasal spray Place 1 spray into both nostrils daily as needed for allergies.    Yes [provider]  hydrochlorothiazide 25 MG tablet Take 25 mg by mouth daily.    Yes Nahser, Wonda Cheng, MD  KLOR-CON M10 10 MEQ tablet TAKE 1 TABLET BY MOUTH EVERY DAY 09/25/15  Yes Nahser, Wonda Cheng, MD  LYRICA 75 MG capsule Take 75 mg by mouth 2 (two) times daily. 07/29/14  Yes [provider]  metoprolol succinate (TOPROL-XL) 50 MG 24 hr tablet TAKE 1 TABLET BY MOUTH DAILY 01/20/17  Yes Nahser, Wonda Cheng, MD  NITROSTAT 0.4 MG SL tablet PLACE 1 TABLET (0.4 MG TOTAL) UNDER THE TONGUE EVERY 5 (FIVE) MINUTES AS NEEDED FOR CHEST PAIN. 04/29/16  Yes Nahser, Wonda Cheng, MD  nortriptyline (PAMELOR) 10 MG capsule Take 10 mg by mouth at bedtime.   Yes [provider]  ondansetron (ZOFRAN ODT) 8 MG disintegrating tablet Take 1 tablet (8 mg total) by mouth every 8 (eight) hours as needed for nausea or vomiting. 03/01/15  Yes Lacretia Leigh, MD  quinapril (ACCUPRIL) 40 MG tablet Take 40 mg by mouth daily.    Yes [provider]  simvastatin (ZOCOR) 40 MG tablet Take 40 mg by mouth at bedtime.    Yes Nahser, Wonda Cheng, MD  traMADol (ULTRAM) 50 MG tablet Take 50 mg by mouth every 6 (six) hours as needed for moderate pain.  04/23/17  Yes [provider]  Vitamin D, Ergocalciferol, (DRISDOL) 50000 UNITS CAPS capsule Take 50,000 Units by mouth every 7 (seven) days.  07/07/13  Yes [provider]  zolpidem (AMBIEN) 10 MG tablet Take 10 mg by mouth at bedtime. 09/15/13  Yes [provider]  anastrozole (ARIMIDEX) 1 MG tablet Take 1 tablet (1 mg total) by mouth daily. Patient not taking: Reported on 04/28/2017 01/24/17   Truitt Merle, MD     Allergies:     Allergies  Allergen Reactions  . Macrodantin Other (See Comments)    rash  . Penicillins Rash and Other (See Comments)  . Tolectin [Tolmetin Sodium] Rash     Physical Exam:   Vitals  Blood pressure (!) 174/100, pulse 75, temperature 99.5 F (37.5 C), temperature source Rectal, resp. rate (!) 23, SpO2 98 %.   1. General lying in bed in NAD,   2. Normal affect and insight, Not Suicidal or Homicidal, Awake Alert, Oriented X 3.  3. No F.N deficits, ALL C.Nerves Intact, Strength 5/5 all 4 extremities, Sensation intact all 4 extremities, Plantars down going.  4. Ears and Eyes appear Normal, Conjunctivae clear, PERRLA. Moist Oral Mucosa.  5. Supple Neck, No JVD, No cervical lymphadenopathy appriciated, No Carotid Bruits.  6. Symmetrical Chest wall movement, Good air movement bilaterally, CTAB.  7. RRR, s1, s2 2/6 sem rusb with radiation  8. Positive Bowel Sounds, Abdomen Soft, No tenderness, No organomegaly appriciated,No rebound -guarding or rigidity.  9.  No Cyanosis, Normal Skin Turgor, No Skin Rash or Bruise.  10. Good muscle tone,  joints appear normal , no effusions, Normal ROM.  11. No Palpable Lymph Nodes in Neck or Axillae  SLR negative    Data Review:    CBC  Recent Labs Lab 04/28/17 2011  WBC 11.2*  HGB 13.9  HCT 39.3  PLT 295  MCV 82.4  MCH 29.1  MCHC 35.4  RDW 14.0   ------------------------------------------------------------------------------------------------------------------  Chemistries   Recent Labs Lab 04/28/17 2105  NA 121*  K 2.9*  CL 78*  CO2 25  GLUCOSE 132*  BUN 13  CREATININE 0.76  CALCIUM 9.5  MG 1.4*  AST 40  ALT 19    ALKPHOS 101  BILITOT 1.4*   ------------------------------------------------------------------------------------------------------------------ CrCl cannot be calculated (Unknown ideal weight.). ------------------------------------------------------------------------------------------------------------------ No results for input(s): TSH, T4TOTAL, T3FREE, THYROIDAB in the last 72 hours.  Invalid input(s): FREET3  Coagulation profile No results for input(s): INR, PROTIME in the last 168 hours. ------------------------------------------------------------------------------------------------------------------- No results for input(s): DDIMER in the last 72 hours. -------------------------------------------------------------------------------------------------------------------  Cardiac Enzymes  Recent Labs Lab 04/28/17 2105  TROPONINI 0.05*   ------------------------------------------------------------------------------------------------------------------ No results found for: BNP   ---------------------------------------------------------------------------------------------------------------  Urinalysis    Component Value Date/Time   COLORURINE STRAW (A) 04/28/2017 2011   APPEARANCEUR CLEAR 04/28/2017 2011   LABSPEC 1.006 04/28/2017 2011   PHURINE 7.0 04/28/2017 2011   GLUCOSEU NEGATIVE 04/28/2017 2011   HGBUR MODERATE (A) 04/28/2017 2011   BILIRUBINUR NEGATIVE 04/28/2017 2011   KETONESUR 20 (A) 04/28/2017 2011   PROTEINUR NEGATIVE 04/28/2017 2011   UROBILINOGEN 0.2 03/01/2015 0932   NITRITE NEGATIVE 04/28/2017 2011   LEUKOCYTESUR NEGATIVE 04/28/2017 2011    ----------------------------------------------------------------------------------------------------------------   Imaging Results:    Dg Chest 2 View  Result Date: 04/28/2017 CLINICAL DATA:  83 year old female mid chest pain. EXAM: CHEST  2 VIEW COMPARISON:  Chest radiograph dated 10/30/2015 FINDINGS: There is  emphysematous changes of the lungs. No focal consolidation, pleural effusion, or pneumothorax. There is cardiomegaly. There is atherosclerotic calcification of the aorta. Left pectoral pacemaker device. There is osteopenia with degenerative changes of the spine and shoulders. No acute  osseous pathology. IMPRESSION: No active cardiopulmonary disease. Electronically Signed   By: Anner Crete M.D.   On: 04/28/2017 21:31   Ct Abdomen Pelvis W Contrast  Result Date: 04/28/2017 CLINICAL DATA:  Left flank pain and left lower quadrant pain. EXAM: CT ABDOMEN AND PELVIS WITH CONTRAST TECHNIQUE: Multidetector CT imaging of the abdomen and pelvis was performed using the standard protocol following bolus administration of intravenous contrast. CONTRAST:  156m ISOVUE-300 IOPAMIDOL (ISOVUE-300) INJECTION 61% COMPARISON:  CT abdomen pelvis 03/01/2015 FINDINGS: Lower chest: No pulmonary nodules or pleural effusion. No visible pericardial effusion. Hepatobiliary: Normal hepatic contours and density. No visible biliary dilatation. Normal gallbladder. Pancreas: Normal contours without ductal dilatation. No peripancreatic fluid collection. Spleen: Normal. Adrenals/Urinary Tract: --Adrenal glands: Normal. --Right kidney/ureter: Lobulated contour, unchanged. Large calculus at the lower pole is also unchanged, measuring 7 mm. --Left kidney/ureter: Unchanged lobulated contour. No hydronephrosis. --Urinary bladder: Unremarkable. Stomach/Bowel: --Stomach/Duodenum: No hiatal hernia or other gastric abnormality. Normal duodenal course and caliber. --Small bowel: No dilatation or inflammation. --Colon: There is rectosigmoid diverticulosis without acute inflammation. --Appendix: Normal. Vascular/Lymphatic: Suprarenal aortic atherosclerotic calcification. No abdominal or pelvic lymphadenopathy. Reproductive: Normal uterus.  No adnexal mass. Musculoskeletal. Severe right hip osteoarthrosis. Multilevel lumbar degenerative disc disease and  facet arthrosis. Grade 1 anterolisthesis at L5-S1 due to facet hypertrophy. Other: None. IMPRESSION: 1. No obstructive uropathy. Unchanged large right lower pole renal stone. 2. Rectosigmoid diverticulosis without acute inflammation. 3.  Aortic Atherosclerosis (ICD10-I70.0). Electronically Signed   By: KUlyses JarredM.D.   On: 04/28/2017 22:42      Assessment & Plan:    Principal Problem:   Hyponatremia Active Problems:   Aortic stenosis   Back pain   Elevated troponin    Back pain secondary to degenerative disk disease , facet arthritis Morphine iv Morphine sulfate 119mpo bid Robaxin 50027mo qid prn  Continue lyrica Pt might be developing opioid dep, may benefit from suboxone or embeda as outpatient  Hyponatremia Likely due to hydrochlorothiazide and pain Check serum osm, tsh , cortisol, urine osm, urine sodium Hydrate with ns iv  Hypokalemia Replete Check cmp in am  Hypomagnesemia Replete Check magnesium in am  Trop elevation likely due to AS Tele Trop I q6h x3 Cardiology consult by email  Aflutter Continue eliquis, metoprolol  Hypertension DC hydrochlorothiazide Continue quinapril  Hyperlipidemia Cont simvastatin  Anxiety Continue xanax for now, but would try to switch to clonazepam later  DVT Prophylaxis continue Eliquis  AM Labs Ordered, also please review Full Orders  Family Communication: Admission, patients condition and plan of care including tests being ordered have been discussed with the patient who indicate understanding and agree with the plan and Code Status.  Code Status FULL CODE  Likely DC to  home  Condition GUARDED    Consults called: cardiology consult by email  Admission status: inpatient  Time spent in minutes : 45 minutes   JamJani GravelD on 04/28/2017 at 11:57 PM  Between 7am to 7pm - Pager - [phone] After 7pm go to [email or web address] - password [email or web address] - Office  [phone]

## 2017-04-28 NOTE — ED Notes (Signed)
Bed: [facility] Expected date:  Expected time:  Means of arrival:  Comments: EMS 83 yo female flank pain and difficulty urinating

## 2017-04-28 NOTE — ED Notes (Signed)
Patient transported to CT 

## 2017-04-29 ENCOUNTER — Inpatient Hospital Stay (HOSPITAL_COMMUNITY): Payer: Medicare Other

## 2017-04-29 ENCOUNTER — Encounter (HOSPITAL_COMMUNITY): Payer: Self-pay | Admitting: Physician Assistant

## 2017-04-29 ENCOUNTER — Ambulatory Visit: Payer: Federal, State, Local not specified - PPO | Admitting: Physical Medicine & Rehabilitation

## 2017-04-29 DIAGNOSIS — I340 Nonrheumatic mitral (valve) insufficiency: Secondary | ICD-10-CM

## 2017-04-29 DIAGNOSIS — E876 Hypokalemia: Secondary | ICD-10-CM | POA: Insufficient documentation

## 2017-04-29 DIAGNOSIS — I350 Nonrheumatic aortic (valve) stenosis: Secondary | ICD-10-CM

## 2017-04-29 DIAGNOSIS — R748 Abnormal levels of other serum enzymes: Secondary | ICD-10-CM

## 2017-04-29 LAB — CBC
HCT: 37.1 % (ref 36.0–46.0)
Hemoglobin: 13 g/dL (ref 12.0–15.0)
MCH: 28.9 pg (ref 26.0–34.0)
MCHC: 35 g/dL (ref 30.0–36.0)
MCV: 82.4 fL (ref 78.0–100.0)
PLATELETS: 233 10*3/uL (ref 150–400)
RBC: 4.5 MIL/uL (ref 3.87–5.11)
RDW: 14.3 % (ref 11.5–15.5)
WBC: 8.9 10*3/uL (ref 4.0–10.5)

## 2017-04-29 LAB — OSMOLALITY, URINE: OSMOLALITY UR: 284 mosm/kg — AB (ref 300–900)

## 2017-04-29 LAB — COMPREHENSIVE METABOLIC PANEL
ALK PHOS: 85 U/L (ref 38–126)
ALT: 18 U/L (ref 14–54)
AST: 33 U/L (ref 15–41)
Albumin: 3.8 g/dL (ref 3.5–5.0)
Anion gap: 9 (ref 5–15)
BUN: 10 mg/dL (ref 6–20)
CALCIUM: 9.1 mg/dL (ref 8.9–10.3)
CO2: 29 mmol/L (ref 22–32)
CREATININE: 0.7 mg/dL (ref 0.44–1.00)
Chloride: 89 mmol/L — ABNORMAL LOW (ref 101–111)
Glucose, Bld: 121 mg/dL — ABNORMAL HIGH (ref 65–99)
Potassium: 2.9 mmol/L — ABNORMAL LOW (ref 3.5–5.1)
Sodium: 127 mmol/L — ABNORMAL LOW (ref 135–145)
Total Bilirubin: 0.9 mg/dL (ref 0.3–1.2)
Total Protein: 5.9 g/dL — ABNORMAL LOW (ref 6.5–8.1)

## 2017-04-29 LAB — TROPONIN I
TROPONIN I: 0.08 ng/mL — AB (ref <0.03)
TROPONIN I: 0.08 ng/mL — AB (ref <0.03)
TROPONIN I: 0.06 ng/mL — AB (ref <0.03)

## 2017-04-29 LAB — MAGNESIUM: MAGNESIUM: 1.7 mg/dL (ref 1.7–2.4)

## 2017-04-29 LAB — ECHOCARDIOGRAM COMPLETE
Height: 59 in
Weight: 1671.97 oz

## 2017-04-29 LAB — SODIUM, URINE, RANDOM: Sodium, Ur: 83 mmol/L

## 2017-04-29 LAB — CORTISOL: Cortisol, Plasma: 8.3 ug/dL

## 2017-04-29 LAB — TSH: TSH: 1.804 u[IU]/mL (ref 0.350–4.500)

## 2017-04-29 MED ORDER — ONDANSETRON HCL 4 MG/2ML IJ SOLN: 4.0000 mg | Freq: Four times a day (QID) | INTRAMUSCULAR | Status: DC | PRN

## 2017-04-29 MED ORDER — SIMVASTATIN 40 MG PO TABS
40.0000 mg | ORAL_TABLET | Freq: Every day | ORAL | Status: DC
  Administered 2017-04-29 – 2017-05-01 (×3): 40 mg via ORAL
  Filled 2017-04-29 (×3): qty 1

## 2017-04-29 MED ORDER — MORPHINE SULFATE ER 15 MG PO TBCR
15.0000 mg | EXTENDED_RELEASE_TABLET | Freq: Two times a day (BID) | ORAL | Status: DC
  Administered 2017-04-29 (×2): 15 mg via ORAL
  Filled 2017-04-29 (×2): qty 1

## 2017-04-29 MED ORDER — ACETAMINOPHEN-CODEINE #3 300-30 MG PO TABS
1.0000 | ORAL_TABLET | Freq: Three times a day (TID) | ORAL | Status: DC | PRN
  Administered 2017-04-29: 1 via ORAL
  Filled 2017-04-29: qty 1

## 2017-04-29 MED ORDER — POTASSIUM CHLORIDE CRYS ER 10 MEQ PO TBCR
10.0000 meq | EXTENDED_RELEASE_TABLET | Freq: Every day | ORAL | Status: DC
  Administered 2017-04-29 – 2017-04-30 (×2): 10 meq via ORAL
  Filled 2017-04-29 (×2): qty 1

## 2017-04-29 MED ORDER — ALPRAZOLAM 0.25 MG PO TABS
0.2500 mg | ORAL_TABLET | Freq: Three times a day (TID) | ORAL | Status: DC | PRN
  Administered 2017-04-29 – 2017-04-30 (×3): 0.25 mg via ORAL
  Filled 2017-04-29 (×3): qty 1

## 2017-04-29 MED ORDER — HYDRALAZINE HCL 20 MG/ML IJ SOLN: 5.0000 mg | INTRAMUSCULAR | Status: DC | PRN

## 2017-04-29 MED ORDER — METHOCARBAMOL 500 MG PO TABS
500.0000 mg | ORAL_TABLET | Freq: Four times a day (QID) | ORAL | Status: DC | PRN
  Administered 2017-04-29 – 2017-05-01 (×3): 500 mg via ORAL
  Filled 2017-04-29 (×3): qty 1

## 2017-04-29 MED ORDER — POTASSIUM CHLORIDE IN NACL 40-0.9 MEQ/L-% IV SOLN
INTRAVENOUS | Status: AC
  Administered 2017-04-29 (×2): 75 mL/h via INTRAVENOUS
  Filled 2017-04-29 (×3): qty 1000

## 2017-04-29 MED ORDER — NORTRIPTYLINE HCL 10 MG PO CAPS
10.0000 mg | ORAL_CAPSULE | Freq: Every day | ORAL | Status: DC
  Administered 2017-04-29 – 2017-05-01 (×3): 10 mg via ORAL
  Filled 2017-04-29 (×3): qty 1

## 2017-04-29 MED ORDER — ONDANSETRON 4 MG PO TBDP: 8.0000 mg | ORAL_TABLET | Freq: Three times a day (TID) | ORAL | Status: DC | PRN

## 2017-04-29 MED ORDER — MORPHINE SULFATE (PF) 4 MG/ML IV SOLN: 0.5000 mg | INTRAVENOUS | Status: DC | PRN

## 2017-04-29 MED ORDER — FLUTICASONE PROPIONATE 50 MCG/ACT NA SUSP: 1.0000 | Freq: Every day | NASAL | Status: DC | PRN

## 2017-04-29 MED ORDER — FAMOTIDINE 20 MG PO TABS
20.0000 mg | ORAL_TABLET | Freq: Two times a day (BID) | ORAL | Status: DC
  Administered 2017-04-29 – 2017-05-02 (×7): 20 mg via ORAL
  Filled 2017-04-29 (×7): qty 1

## 2017-04-29 MED ORDER — PREGABALIN 75 MG PO CAPS
75.0000 mg | ORAL_CAPSULE | Freq: Two times a day (BID) | ORAL | Status: DC
  Administered 2017-04-29 – 2017-05-02 (×7): 75 mg via ORAL
  Filled 2017-04-29 (×7): qty 1

## 2017-04-29 MED ORDER — NITROGLYCERIN 0.4 MG SL SUBL: 0.4000 mg | SUBLINGUAL_TABLET | SUBLINGUAL | Status: DC | PRN

## 2017-04-29 MED ORDER — VITAMIN D (ERGOCALCIFEROL) 1.25 MG (50000 UNIT) PO CAPS
50000.0000 [IU] | ORAL_CAPSULE | ORAL | Status: DC
  Administered 2017-04-30: 50000 [IU] via ORAL
  Filled 2017-04-29: qty 1

## 2017-04-29 MED ORDER — ZOLPIDEM TARTRATE 5 MG PO TABS
5.0000 mg | ORAL_TABLET | Freq: Every day | ORAL | Status: DC
  Administered 2017-04-29 – 2017-05-01 (×3): 5 mg via ORAL
  Filled 2017-04-29 (×3): qty 1

## 2017-04-29 MED ORDER — APIXABAN 2.5 MG PO TABS
2.5000 mg | ORAL_TABLET | Freq: Two times a day (BID) | ORAL | Status: DC
  Administered 2017-04-29 – 2017-05-02 (×7): 2.5 mg via ORAL
  Filled 2017-04-29 (×7): qty 1

## 2017-04-29 MED ORDER — POTASSIUM CHLORIDE CRYS ER 20 MEQ PO TBCR
40.0000 meq | EXTENDED_RELEASE_TABLET | Freq: Two times a day (BID) | ORAL | Status: AC
  Administered 2017-04-29 (×2): 40 meq via ORAL
  Filled 2017-04-29 (×2): qty 2

## 2017-04-29 MED ORDER — MAGNESIUM SULFATE 2 GM/50ML IV SOLN
2.0000 g | Freq: Once | INTRAVENOUS | Status: AC
  Administered 2017-04-29: 2 g via INTRAVENOUS
  Filled 2017-04-29: qty 50

## 2017-04-29 MED ORDER — LISINOPRIL 20 MG PO TABS
40.0000 mg | ORAL_TABLET | Freq: Every day | ORAL | Status: DC
  Administered 2017-04-29 – 2017-05-02 (×4): 40 mg via ORAL
  Filled 2017-04-29 (×4): qty 2

## 2017-04-29 MED ORDER — DIPHENHYDRAMINE HCL 50 MG/ML IJ SOLN: 25.0000 mg | Freq: Four times a day (QID) | INTRAMUSCULAR | Status: DC | PRN

## 2017-04-29 MED ORDER — TRAMADOL HCL 50 MG PO TABS
50.0000 mg | ORAL_TABLET | Freq: Four times a day (QID) | ORAL | Status: DC | PRN
  Administered 2017-04-29 – 2017-05-02 (×6): 50 mg via ORAL
  Filled 2017-04-29 (×6): qty 1

## 2017-04-29 MED ORDER — INFLUENZA VAC SPLIT HIGH-DOSE 0.5 ML IM SUSY
0.5000 mL | PREFILLED_SYRINGE | INTRAMUSCULAR | Status: AC
  Administered 2017-04-30: 0.5 mL via INTRAMUSCULAR
  Filled 2017-04-29: qty 0.5

## 2017-04-29 MED ORDER — METOPROLOL SUCCINATE ER 50 MG PO TB24
50.0000 mg | ORAL_TABLET | Freq: Every day | ORAL | Status: DC
  Administered 2017-04-29 – 2017-05-02 (×4): 50 mg via ORAL
  Filled 2017-04-29 (×4): qty 1

## 2017-04-29 NOTE — Progress Notes (Signed)
  Echocardiogram 2D Echocardiogram has been performed.  Erin Charles T Goro Wenrick 04/29/2017, 9:11 AM

## 2017-04-29 NOTE — Consult Note (Signed)
Cardiology Consultation:   Patient ID: Erin Charles; [id number]; [birth year]   Admit date: 04/28/2017 Date of Consult: 04/29/2017  Primary Care Provider: Hulan Fess, MD Primary Cardiologist: Dr. Acie Fredrickson Primary Electrophysiologist:  Dr. Lovena Le  Chief Complaint: flank pain  Patient Profile:   Erin Charles is a 83 y.o. female with a hx of Severe aortic stenosis (pt has declined TAVR), chest pain with only mild nonobstructive CAD by cath 2016, macular degeneration, complete heart block s/p St. Jude PPM 2012, high grade breast ductal cancer in situ (treated with anastrazole, not a candidate for surgery given AS), paroxysmal atrial fib/flutter, RBBB, chronic back pain (followed in pain management) who is being seen today for the evaluation of elevated troponin at the request of Dr. Maudie Mercury. Dementia is mentioned in H/p but the patient is relatively A+O and able to provide a good history.  History of Present Illness:   To recap history, prioir LHC 08/2014: 40% D1, 30% LM (done prior to possible TAVR which patient went on to decline). Last echo 03/2016 showed mild LVH, EF 60-65%, severe AS with increased mean gradient compared to prior (65), peak 60mmHg, mild MR. Device has not been interrogated since 2017; she's not sure why. Last OV with Dr. Acie Fredrickson 10/2016 appeared to be in atrial flutter vs coarse afib, managed via rate control at that time.  She presented to the ED last night with left flank pain, suprapubic pain, dysuria and inability to urinate. She felt these symptoms were similar to prior UTI. She's not had any chest pain, dyspnea, palpitations, LEE, orthopnea, dizziness or syncope. Troponins 0.05-0.08. Presenting sodium 121 (hyponatremia)->127, hypokalemia to 2.9, hypomagnesemia of 1.4->1.7, lipase of 52, normal lactate and CBC, CXR without acute CP disease, CT a/p without obstructive uropathy; unchanged R renal stone, rectosigmoid diverticulosis otherwise nonacute. She does state her  mouth is dry and she feels a little dehydrated. She is being treated with IV fluids which has helped her hyponatremia.  Past Medical History:  Diagnosis Date  . Advanced age   . Atrial flutter ([place])   . Chronic pain    a. followed in pain mgmt.  . Complete heart block ([place])    Pacer  . 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 Dyslipidemia   . HTN (hypertension)   . Macular degeneration   . Mild CAD    a. LHC 08/2014: 40% D1, 30% LM.  . OA (osteoarthritis)   . PAF (paroxysmal atrial fibrillation) ([place])   . RBBB (right bundle branch block)   . Severe aortic stenosis    a. pt has declined TAVR.    Past Surgical History:  Procedure Laterality Date  . EYE SURGERY    . HERNIA REPAIR    . INSERT / REPLACE / REMOVE PACEMAKER  08/30/10   DUAL CHAMBER/ST. JUDE  . LEFT AND RIGHT HEART CATHETERIZATION WITH CORONARY ANGIOGRAM N/A 08/09/2014   Procedure: LEFT AND RIGHT HEART CATHETERIZATION WITH CORONARY ANGIOGRAM;  Surgeon: Burnell Blanks, MD;  Location: [facility] CATH LAB;  Service: Cardiovascular;  Laterality: N/A;  . PACEMAKER INSERTION    . PILONIDAL CYST EXCISION       Inpatient Medications: Scheduled Meds: . apixaban  2.5 mg Oral BID  . famotidine  20 mg Oral BID  . lisinopril  40 mg Oral Daily  . metoprolol succinate  50 mg Oral Daily  . morphine  15 mg Oral Q12H  . nortriptyline  10 mg Oral QHS  . potassium chloride  10 mEq Oral Daily  . pregabalin  75 mg Oral BID  . simvastatin  40 mg Oral QHS  . [START ON 04/30/2017] Vitamin D (Ergocalciferol)  50,000 Units Oral Q7 days  . zolpidem  5 mg Oral QHS   Continuous Infusions: . 0.9 % NaCl with KCl 40 mEq / L 75 mL/hr (04/29/17 0421)   PRN Meds: acetaminophen-codeine, ALPRAZolam, diphenhydrAMINE, fluticasone, methocarbamol, morphine injection, nitroGLYCERIN, ondansetron (ZOFRAN) IV, ondansetron  Home Meds: Prior to Admission medications   Medication Sig Start Date End Date Taking? Authorizing Provider    acetaminophen-codeine (TYLENOL #3) 300-30 MG tablet TAKE 1 TABLET BY MOUTH EVERY 8 HOURS AS NEEDED Patient taking differently: TAKE 1 TABLET BY MOUTH EVERY 8 HOURS AS NEEDED for pain 04/08/17  Yes Kirsteins, Luanna Salk, MD  ALPRAZolam Duanne Moron) 0.25 MG tablet Take 0.25 mg by mouth every 8 (eight) hours as needed for anxiety.    Yes [provider]  clindamycin (CLEOCIN) 150 MG capsule Take by mouth as needed. 4 capsules 1 hour prior to dental work 07/18/14  Yes [provider]  ELIQUIS 2.5 MG TABS tablet TAKE 1 TABLET BY MOUTH TWICE A DAY 02/24/17  Yes Nahser, Wonda Cheng, MD  famotidine (PEPCID) 20 MG tablet Take 1 tablet (20 mg total) by mouth 2 (two) times daily. 10/30/15  Yes Ward, Ozella Almond, PA-C  fluticasone (FLONASE) 50 MCG/ACT nasal spray Place 1 spray into both nostrils daily as needed for allergies.    Yes [provider]  hydrochlorothiazide 25 MG tablet Take 25 mg by mouth daily.    Yes Nahser, Wonda Cheng, MD  KLOR-CON M10 10 MEQ tablet TAKE 1 TABLET BY MOUTH EVERY DAY 09/25/15  Yes Nahser, Wonda Cheng, MD  LYRICA 75 MG capsule Take 75 mg by mouth 2 (two) times daily. 07/29/14  Yes [provider]  metoprolol succinate (TOPROL-XL) 50 MG 24 hr tablet TAKE 1 TABLET BY MOUTH DAILY 01/20/17  Yes Nahser, Wonda Cheng, MD  NITROSTAT 0.4 MG SL tablet PLACE 1 TABLET (0.4 MG TOTAL) UNDER THE TONGUE EVERY 5 (FIVE) MINUTES AS NEEDED FOR CHEST PAIN. 04/29/16  Yes Nahser, Wonda Cheng, MD  nortriptyline (PAMELOR) 10 MG capsule Take 10 mg by mouth at bedtime.   Yes [provider]  ondansetron (ZOFRAN ODT) 8 MG disintegrating tablet Take 1 tablet (8 mg total) by mouth every 8 (eight) hours as needed for nausea or vomiting. 03/01/15  Yes Lacretia Leigh, MD  quinapril (ACCUPRIL) 40 MG tablet Take 40 mg by mouth daily.    Yes [provider]  simvastatin (ZOCOR) 40 MG tablet Take 40 mg by mouth at bedtime.    Yes Nahser, Wonda Cheng, MD  traMADol (ULTRAM) 50 MG tablet Take 50  mg by mouth every 6 (six) hours as needed for moderate pain.  04/23/17  Yes [provider]  Vitamin D, Ergocalciferol, (DRISDOL) 50000 UNITS CAPS capsule Take 50,000 Units by mouth every 7 (seven) days.  07/07/13  Yes [provider]  zolpidem (AMBIEN) 10 MG tablet Take 10 mg by mouth at bedtime. 09/15/13  Yes [provider]  anastrozole (ARIMIDEX) 1 MG tablet Take 1 tablet (1 mg total) by mouth daily. Patient not taking: Reported on 04/28/2017 01/24/17   Truitt Merle, MD    Allergies:    Allergies  Allergen Reactions  . Macrodantin Other (See Comments)    rash  . Penicillins Rash and Other (See Comments)  . Tolectin [Tolmetin Sodium] Rash    Social History:  Social History   Social History  . Marital status: Married    Spouse name: N/A  . Number of children: N/A  . Years of education: N/A   Occupational History  . Not on file.   Social History Main Topics  . Smoking status: Never Smoker  . Smokeless tobacco: Never Used  . Alcohol use No  . Drug use: No  . Sexual activity: Not Currently    Birth control/ protection: Post-menopausal   Other Topics Concern  . Not on file   Social History Narrative  . No narrative on file    Family History:   The patient's family history includes Cancer in her mother; Heart attack in her sister; Heart failure in her father.  ROS:  Please see the history of present illness.  All other ROS reviewed and negative.     Physical Exam/Data:   Vitals:   04/29/17 0400 04/29/17 0415 04/29/17 0442 04/29/17 0547  BP: (!) 148/73  (!) 173/65   Pulse:  71 71   Resp: (!) 23 (!) 22 20   Temp:   97.8 F (36.6 C)   TempSrc:   Oral   SpO2:  97%    Weight:    104 lb 8 oz (47.4 kg)  Height:    4\' 11"  (1.499 m)    Intake/Output Summary (Last 24 hours) at 04/29/17 0818 Last data filed at 04/29/17 0600  Gross per 24 hour  Intake           443.75 ml  Output             1000 ml  Net          -556.25 ml   Filed Weights    04/29/17 0547  Weight: 104 lb 8 oz (47.4 kg)   Body mass index is 21.11 kg/m.  General: Well developed friendly frail WF, in no acute distress. Head: Normocephalic, atraumatic, sclera non-icteric, no xanthomas, nares are without discharge.  Neck: Left carotid bruit. JVD not elevated. Lungs: Clear bilaterally to auscultation without wheezes, rales, or rhonchi. Breathing is unlabored. Heart: RRR. 2/6 SEM at RUSB with diminished S2. No rubs or gallops appreciated. Abdomen: Soft, non-tender, non-distended with normoactive bowel sounds. No hepatomegaly. No rebound/guarding. No obvious abdominal masses. Msk:  Strength and tone appear normal for age. Extremities: No clubbing or cyanosis. No edema.  Distal pedal pulses are 2+ and equal bilaterally. Neuro: Alert and oriented X 3. No facial asymmetry. No focal deficit. Moves all extremities spontaneously. Psych:  Responds to questions appropriately with a normal affect.  EKG:  The EKG was personally reviewed and demonstrates poor quality due to baseline artifact but appears mostly NSR with V pacing, occasional irregularity but telemetry confirms mostly NSR  Relevant CV Studies: As above  Laboratory Data:  Chemistry Recent Labs Lab 04/28/17 2105 04/29/17 0616  NA 121* 127*  K 2.9* 2.9*  CL 78* 89*  CO2 25 29  GLUCOSE 132* 121*  BUN 13 10  CREATININE 0.76 0.70  CALCIUM 9.5 9.1  GFRNONAA >60 >60  GFRAA >60 >60  ANIONGAP 18* 9     Recent Labs Lab 04/28/17 2105 04/29/17 0616  PROT 6.8 5.9*  ALBUMIN 4.4 3.8  AST 40 33  ALT 19 18  ALKPHOS 101 85  BILITOT 1.4* 0.9   Hematology Recent Labs Lab 04/28/17 2011 04/29/17 0616  WBC 11.2* 8.9  RBC 4.77 4.50  HGB 13.9 13.0  HCT 39.3 37.1  MCV 82.4 82.4  MCH 29.1 28.9  MCHC 35.4 35.0  RDW 14.0 14.3  PLT 295 233   Cardiac Enzymes Recent Labs Lab 04/28/17 2105 04/29/17 0335  TROPONINI 0.05* 0.08*   No results for input(s): TROPIPOC in the last 168 hours.  BNPNo results  for input(s): BNP, PROBNP in the last 168 hours.  DDimer No results for input(s): DDIMER in the last 168 hours.  Radiology/Studies:  Dg Chest 2 View  Result Date: 04/28/2017 CLINICAL DATA:  83 year old female mid chest pain. EXAM: CHEST  2 VIEW COMPARISON:  Chest radiograph dated 10/30/2015 FINDINGS: There is emphysematous changes of the lungs. No focal consolidation, pleural effusion, or pneumothorax. There is cardiomegaly. There is atherosclerotic calcification of the aorta. Left pectoral pacemaker device. There is osteopenia with degenerative changes of the spine and shoulders. No acute osseous pathology. IMPRESSION: No active cardiopulmonary disease. Electronically Signed   By: Anner Crete M.D.   On: 04/28/2017 21:31   Ct Abdomen Pelvis W Contrast  Result Date: 04/28/2017 CLINICAL DATA:  Left flank pain and left lower quadrant pain. EXAM: CT ABDOMEN AND PELVIS WITH CONTRAST TECHNIQUE: Multidetector CT imaging of the abdomen and pelvis was performed using the standard protocol following bolus administration of intravenous contrast. CONTRAST:  175mL ISOVUE-300 IOPAMIDOL (ISOVUE-300) INJECTION 61% COMPARISON:  CT abdomen pelvis 03/01/2015 FINDINGS: Lower chest: No pulmonary nodules or pleural effusion. No visible pericardial effusion. Hepatobiliary: Normal hepatic contours and density. No visible biliary dilatation. Normal gallbladder. Pancreas: Normal contours without ductal dilatation. No peripancreatic fluid collection. Spleen: Normal. Adrenals/Urinary Tract: --Adrenal glands: Normal. --Right kidney/ureter: Lobulated contour, unchanged. Large calculus at the lower pole is also unchanged, measuring 7 mm. --Left kidney/ureter: Unchanged lobulated contour. No hydronephrosis. --Urinary bladder: Unremarkable. Stomach/Bowel: --Stomach/Duodenum: No hiatal hernia or other gastric abnormality. Normal duodenal course and caliber. --Small bowel: No dilatation or inflammation. --Colon: There is rectosigmoid  diverticulosis without acute inflammation. --Appendix: Normal. Vascular/Lymphatic: Suprarenal aortic atherosclerotic calcification. No abdominal or pelvic lymphadenopathy. Reproductive: Normal uterus.  No adnexal mass. Musculoskeletal. Severe right hip osteoarthrosis. Multilevel lumbar degenerative disc disease and facet arthrosis. Grade 1 anterolisthesis at L5-S1 due to facet hypertrophy. Other: None. IMPRESSION: 1. No obstructive uropathy. Unchanged large right lower pole renal stone. 2. Rectosigmoid diverticulosis without acute inflammation. 3.  Aortic Atherosclerosis (ICD10-I70.0). Electronically Signed   By: Ulyses Jarred M.D.   On: 04/28/2017 22:42    Assessment and Plan:   1. Acute on chronic back pain with multiple electrolyte abnormalities, dysuria - further per IM. Do not suspect an acute cardiac issue. Pain has improved.   2. Minimal troponin elevation - may be demand in the setting of known severe aortic stenosis. She has not had any change in recent cardiac symptoms. No CP, SOB, dizziness, pre-syncope or syncope. Would not have suspected further workup is needed, but 2D echo was ordered by primary team and is pending. Do not suspect this will change management.  3. Paroxysmal atrial fib/flutter, managed with rate control strategy and anticoagulation - rates controlled, appears to be NSR on telemetry with occasional PACs. No further intervention needed acutely. Will need to f/u EP as outpatient for updated tracking of her device.  4. Severe aortic stenosis - followed conservatively for now as patient has declined TAVR. Would avoid precipitating hypotension and watch volume - would cap IVF when clinically appropriate.  5. HTN - BP might be elevated in setting of acute pain as it seems to flucutate. IM has continued home regimen (aside from HCTZ due to hyponatremia), recommend to follow BP.  Signed, Nedra Hai  Dunn, PA-C  04/29/2017 8:18 AM

## 2017-04-29 NOTE — Progress Notes (Addendum)
Patient seen and examined at bedside. Rounding team including myself, RN, pharmacist, case manager also present during rounds. Patient admitted after midnight, please see earlier admission note by Dr. Jani Gravel.   In brief, patient is 83 year old female with known dementia, severe aortic stenosis, chronic back pain, atrial flutter, anticoagulated with Eliquis, presented to [facility] emergency department with main concern of intractable lower back pain. In the emergency department, CT abdomen and pelvis obtained, no obstructive uropathy noted, rectosigmoid diverticulosis without acute inflammation noted. Pain thought to be secondary to degenerative disc disease, facet arthritis. Patient was started on scheduled by mouth analgesia as well as IV analgesia as needed. RN raise concern of underlying dementia and patient asking frequently for pain medicine, for getting she has asked to get medication. There is a concern of opioid dependence versus overuse in the setting of dementia.  Off note, patient also noted to be hyponatremic with sodium of 121. IV fluids have been provided and sodium is slowly improving, 127 this morning. Potassium and magnesium also low, we'll continue to supplement. Oral intake encouraged, will repeat CBC and BMP in the morning  HTN, essential, SBP in 170's this AM, continue Lisinopril, Metoprolol, will add Hydralazine as needed.   Mild troponin elevation noted, thought to be due to demand ischemia in the setting of multiple electrolyte abnormalities and aortic stenosis. Cardiology consulted,currently no indication for invasive cardiac interventions.  Faye Ramsay, MD  [facility] Pager [phone]  If 7PM-7AM, please contact night-coverage [email or web address] Password [email or web address]

## 2017-04-30 DIAGNOSIS — R339 Retention of urine, unspecified: Secondary | ICD-10-CM

## 2017-04-30 DIAGNOSIS — E876 Hypokalemia: Secondary | ICD-10-CM

## 2017-04-30 DIAGNOSIS — M545 Low back pain: Secondary | ICD-10-CM

## 2017-04-30 DIAGNOSIS — E871 Hypo-osmolality and hyponatremia: Secondary | ICD-10-CM

## 2017-04-30 LAB — URINE CULTURE: CULTURE: NO GROWTH

## 2017-04-30 LAB — BASIC METABOLIC PANEL
Anion gap: 7 (ref 5–15)
Anion gap: 8 (ref 5–15)
BUN: 15 mg/dL (ref 6–20)
BUN: 14 mg/dL (ref 6–20)
CALCIUM: 9 mg/dL (ref 8.9–10.3)
CALCIUM: 9.2 mg/dL (ref 8.9–10.3)
CO2: 26 mmol/L (ref 22–32)
CO2: 26 mmol/L (ref 22–32)
CREATININE: 0.87 mg/dL (ref 0.44–1.00)
Chloride: 97 mmol/L — ABNORMAL LOW (ref 101–111)
Chloride: 96 mmol/L — ABNORMAL LOW (ref 101–111)
Creatinine, Ser: 0.88 mg/dL (ref 0.44–1.00)
GFR calc Af Amer: >60 mL/min (ref >60)
GFR calc Af Amer: >60 mL/min (ref >60)
GFR calc non Af Amer: 56 mL/min — ABNORMAL LOW (ref >60)
GFR, EST NON AFRICAN AMERICAN: 55 mL/min — AB (ref >60)
GLUCOSE: 93 mg/dL (ref 65–99)
GLUCOSE: 127 mg/dL — AB (ref 65–99)
Potassium: 5.7 mmol/L — ABNORMAL HIGH (ref 3.5–5.1)
Potassium: 5.9 mmol/L — ABNORMAL HIGH (ref 3.5–5.1)
Sodium: 130 mmol/L — ABNORMAL LOW (ref 135–145)
Sodium: 130 mmol/L — ABNORMAL LOW (ref 135–145)

## 2017-04-30 LAB — CBC
HEMATOCRIT: 33.7 % — AB (ref 36.0–46.0)
Hemoglobin: 11.5 g/dL — ABNORMAL LOW (ref 12.0–15.0)
MCH: 29.1 pg (ref 26.0–34.0)
MCHC: 34.1 g/dL (ref 30.0–36.0)
MCV: 85.3 fL (ref 78.0–100.0)
PLATELETS: 226 10*3/uL (ref 150–400)
RBC: 3.95 MIL/uL (ref 3.87–5.11)
RDW: 14.8 % (ref 11.5–15.5)
WBC: 8.5 10*3/uL (ref 4.0–10.5)

## 2017-04-30 LAB — MAGNESIUM: Magnesium: 2 mg/dL (ref 1.7–2.4)

## 2017-04-30 NOTE — Progress Notes (Signed)
Pt for SNF placement, CSW will follow.

## 2017-04-30 NOTE — Evaluation (Signed)
Physical Therapy Evaluation Patient Details Name: Erin Charles MRN: [id number] DOB: December 22, 1922 Today's Date: 04/30/2017   History of Present Illness  patient is 83 year old female with known dementia, severe aortic stenosis, chronic back pain, atrial flutter,, presented to [facility] emergency department with main concern of intractable lower back pain.  CT abdomen and pelvis obtained, no obstructive uropathy noted, rectosigmoid diverticulosis without acute inflammation noted. Pain thought to be secondary to degenerative disc disease, facet arthritis  Clinical Impression  The patient is very pleasant. Requires assistance for ambulation at present. No family present . The patient did appear to understand about rehab after Dc and requested daughter be consulted. Pt admitted with above diagnosis. Pt currently with functional limitations due to the deficits listed below (see PT Problem List). Pt will benefit from skilled PT to increase their independence and safety with mobility to allow discharge to the venue listed below.       Follow Up Recommendations SNF    Equipment Recommendations  None recommended by PT    Recommendations for Other Services       Precautions / Restrictions Precautions Precautions: Fall Precaution Comments: wear sneakers Restrictions Weight Bearing Restrictions: No      Mobility  Bed Mobility Overal bed mobility: Needs Assistance Bed Mobility: Sidelying to Sit;Rolling Rolling: Min guard Sidelying to sit: Min guard       General bed mobility comments: extra time, use of bed rail. slow to self assist pushing up to sitting.  Transfers Overall transfer level: Needs assistance Equipment used: Rolling walker (2 wheeled) Transfers: Sit to/from Stand Sit to Stand: Mod assist         General transfer comment: Steady assist to rise from bed. Leaning posteriorly. Multimodal cues  to hold RW and to lean forward into RW.    Ambulation/Gait Ambulation/Gait assistance: Mod assist;+2 safety/equipment Ambulation Distance (Feet): 40 Feet Assistive device: Rolling walker (2 wheeled) Gait Pattern/deviations: Step-to pattern;Staggering right;Staggering left;Leaning posteriorly Gait velocity: decr.   General Gait Details: decrease balance at times, steady assist required. frequent  cues for position inside RW.  Stairs            Wheelchair Mobility    Modified Rankin (Stroke Patients Only)       Balance Overall balance assessment: Needs assistance Sitting-balance support: Feet supported;No upper extremity supported Sitting balance-Leahy Scale: Fair     Standing balance support: Bilateral upper extremity supported;During functional activity Standing balance-Leahy Scale: Poor Standing balance comment: posterior lean                             Pertinent Vitals/Pain Pain Assessment: Faces Faces Pain Scale: Hurts even more Pain Location: low back Pain Descriptors / Indicators: Aching Pain Intervention(s): Monitored during session;Repositioned    Home Living Family/patient expects to be discharged to:: Private residence Living Arrangements: Spouse/significant other;Children Available Help at Discharge: Family Type of Home: House       Home Layout: One level Home Equipment: Kasandra Knudsen - single point Additional Comments: unable to get all info regarding home environment. Daughter is local.    Prior Function Level of Independence: Needs assistance   Gait / Transfers Assistance Needed: amb. with a cane.   ADL's / Homemaking Assistance Needed: unsure        Hand Dominance        Extremity/Trunk Assessment   Upper Extremity Assessment Upper Extremity Assessment: RUE deficits/detail;LUE deficits/detail RUE Deficits / Details: decreased shoulder elevation, hand joint deformities.  LUE Deficits / Details: similar to right    Lower Extremity Assessment Lower Extremity  Assessment: RLE deficits/detail;LLE deficits/detail RLE Deficits / Details: joint deformities of the feet/toes. LLE Deficits / Details: noted bruise on dorsal foot.  foot deformities.    Cervical / Trunk Assessment Cervical / Trunk Assessment: Kyphotic  Communication   Communication: HOH  Cognition Arousal/Alertness: Awake/alert Behavior During Therapy: WFL for tasks assessed/performed Overall Cognitive Status: No family/caregiver present to determine baseline cognitive functioning Area of Impairment: Orientation                 Orientation Level: Place;Time             General Comments: able to follow commands,       General Comments      Exercises     Assessment/Plan    PT Assessment Patient needs continued PT services  PT Problem List Decreased strength;Decreased range of motion;Decreased activity tolerance;Decreased balance;Decreased mobility;Decreased knowledge of precautions;Decreased safety awareness;Decreased knowledge of use of DME;Pain;Decreased cognition       PT Treatment Interventions DME instruction;Gait training;Functional mobility training;Therapeutic activities;Therapeutic exercise;Patient/family education    PT Goals (Current goals can be found in the Care Plan section)  Acute Rehab PT Goals Patient Stated Goal: to go to a place like [facility] PT Goal Formulation: With patient Time For Goal Achievement: 05/14/17 Potential to Achieve Goals: Good    Frequency Min 3X/week   Barriers to discharge        Co-evaluation               AM-PAC PT "6 Clicks" Daily Activity  Outcome Measure Difficulty turning over in bed (including adjusting bedclothes, sheets and blankets)?: A Little Difficulty moving from lying on back to sitting on the side of the bed? : A Little Difficulty sitting down on and standing up from a chair with arms (e.g., wheelchair, bedside commode, etc,.)?: A Lot Help needed moving to and from a bed to chair  (including a wheelchair)?: A Lot Help needed walking in hospital room?: A Lot Help needed climbing 3-5 steps with a railing? : Total 6 Click Score: 13    End of Session   Activity Tolerance: Patient tolerated treatment well Patient left: in chair;with call bell/phone within reach;with chair alarm set Nurse Communication: Mobility status PT Visit Diagnosis: Difficulty in walking, not elsewhere classified (R26.2);Unsteadiness on feet (R26.81)    Time: 0830-0900 PT Time Calculation (min) (ACUTE ONLY): 30 min   Charges:   PT Evaluation $PT Eval Low Complexity: 1 Low PT Treatments $Gait Training: 8-22 mins   PT G CodesTresa Charles PT [phone]  Erin Charles 04/30/2017, 9:13 AM

## 2017-05-01 LAB — BASIC METABOLIC PANEL
ANION GAP: 6 (ref 5–15)
BUN: 20 mg/dL (ref 6–20)
CHLORIDE: 98 mmol/L — AB (ref 101–111)
CO2: 25 mmol/L (ref 22–32)
CREATININE: 0.69 mg/dL (ref 0.44–1.00)
Calcium: 9.1 mg/dL (ref 8.9–10.3)
GFR calc Af Amer: >60 mL/min (ref >60)
GFR calc non Af Amer: >60 mL/min (ref >60)
Glucose, Bld: 103 mg/dL — ABNORMAL HIGH (ref 65–99)
POTASSIUM: 4.9 mmol/L (ref 3.5–5.1)
SODIUM: 129 mmol/L — AB (ref 135–145)

## 2017-05-01 MED ORDER — DOCUSATE SODIUM 100 MG PO CAPS
100.0000 mg | ORAL_CAPSULE | Freq: Two times a day (BID) | ORAL | Status: DC
  Administered 2017-05-01 – 2017-05-02 (×3): 100 mg via ORAL
  Filled 2017-05-01 (×3): qty 1

## 2017-05-01 MED ORDER — POLYETHYLENE GLYCOL 3350 17 G PO PACK
17.0000 g | PACK | Freq: Every day | ORAL | Status: DC
  Administered 2017-05-01 – 2017-05-02 (×2): 17 g via ORAL
  Filled 2017-05-01 (×2): qty 1

## 2017-05-01 NOTE — Progress Notes (Signed)
Physical Therapy Treatment Patient Details Name: Erin Charles MRN: [id number] DOB: [birth year] Today's Date: 05/01/2017    History of Present Illness patient is 83 year old female with known dementia, severe aortic stenosis, chronic back pain, atrial flutter,, presented to [facility] emergency department with main concern of intractable lower back pain.  CT abdomen and pelvis obtained, no obstructive uropathy noted, rectosigmoid diverticulosis without acute inflammation noted. Pain thought to be secondary to degenerative disc disease, facet arthritis    PT Comments    Progressing slowly with mobility. Pt c/o moderate R shoulder pain today along with usual chronic back pain. She is at risk for falls when mobilizing. Continue to recommend SNF for rehab.    Follow Up Recommendations  SNF     Equipment Recommendations  None recommended by PT    Recommendations for Other Services       Precautions / Restrictions Precautions Precautions: Fall Precaution Comments: wear sneakers Restrictions Weight Bearing Restrictions: No    Mobility  Bed Mobility Overal bed mobility: Needs Assistance Bed Mobility: Supine to Sit     Supine to sit: Min assist;HOB elevated     General bed mobility comments: extra time, use of bed rail. slow to self assist pushing up to sitting. assist to scoot to EOB  Transfers Overall transfer level: Needs assistance Equipment used: Rolling walker (2 wheeled) Transfers: Sit to/from [facility] Sit to Stand: Mod assist Stand pivot transfers: Min assist       General transfer comment: Assist to rise, stabilize, control descent. Increased time. VCs safety, hand placement. Stand pivot, bed to bsc, wit RW  Ambulation/Gait Ambulation/Gait assistance: Mod assist Ambulation Distance (Feet): 56 Feet Assistive device: Rolling walker (2 wheeled) Gait Pattern/deviations: Step-through pattern;Decreased stride length;Trunk flexed;Staggering  left;Staggering right     General Gait Details: Assist to stabilize pt and maneuver safely with RW. Slow gait speed. Brief standing rest break midway due to fatigue. Cues for safety, distance from RW.    Stairs            Wheelchair Mobility    Modified Rankin (Stroke Patients Only)       Balance Overall balance assessment: Needs assistance         Standing balance support: Bilateral upper extremity supported Standing balance-Leahy Scale: Poor                              Cognition Arousal/Alertness: Awake/alert Behavior During Therapy: WFL for tasks assessed/performed Overall Cognitive Status: No family/caregiver present to determine baseline cognitive functioning                                        Exercises      General Comments        Pertinent Vitals/Pain Pain Assessment: Faces Faces Pain Scale: Hurts even more Pain Location: R shoulder, low back Pain Descriptors / Indicators: Sore;Aching;Grimacing Pain Intervention(s): Limited activity within patient's tolerance;Repositioned    Home Living                      Prior Function            PT Goals (current goals can now be found in the care plan section) Progress towards PT goals: Progressing toward goals    Frequency    Min 3X/week      PT Plan  Current plan remains appropriate    Co-evaluation              AM-PAC PT "6 Clicks" Daily Activity  Outcome Measure  Difficulty turning over in bed (including adjusting bedclothes, sheets and blankets)?: A Little Difficulty moving from lying on back to sitting on the side of the bed? : A Little Difficulty sitting down on and standing up from a chair with arms (e.g., wheelchair, bedside commode, etc,.)?: Unable Help needed moving to and from a bed to chair (including a wheelchair)?: A Lot Help needed walking in hospital room?: A Lot Help needed climbing 3-5 steps with a railing? : Total 6 Click  Score: 12    End of Session Equipment Utilized During Treatment: Gait belt Activity Tolerance: Patient limited by fatigue;Patient limited by pain Patient left: in chair;with call bell/phone within reach;with chair alarm set   PT Visit Diagnosis: Muscle weakness (generalized) (M62.81);Difficulty in walking, not elsewhere classified (R26.2)     Time: 9892-1194 PT Time Calculation (min) (ACUTE ONLY): 26 min  Charges:  $Gait Training: 8-22 mins $Therapeutic Activity: 8-22 mins                    G Codes:        Weston Anna, MPT Pager: [phone]

## 2017-05-01 NOTE — Progress Notes (Signed)
[facility] PROGRESS NOTE  ZANOVIA ROTZ WYO:[id number] DOB: 11/26/1922 DOA: 04/28/2017 PCP: Hulan Fess, MD  Interim summary and HPI 83 y.o. female, w Dementia, Aortic Stenosis (severe), Aflutter, c/o back pain, intractable without radiation.  Assessment/Plan: 1-back pain: appears to be secondary to facet arthritis -unchanged known right lower pole renal stone.  -no signs of active infection or hydronephrosis -will continue PRN analgesia and PT -found weak and deconditioned, will need SNF for rehab. -follow clinical response and continue supportive care -If stable, may be able to be discharged tomorrow morning.  2-elevated troponin -appears to be secondary to demand ischemia -no CP, no SOB -per cardiology service no plans on further intervention or further work up at this point. -Patient remains chest pain-free.  3-hypokalemia, hyponatremia and hypomagnesemia -most likely from poor intake and dehydration -continue IVF's -Will follow electrolytes trend and continue repletion as needed  4-dementia -Continue supportive care  -Able to follow commands easily redirected  5-HTN -Stable and well controlled. -Will continue current antihypertensive regimen (lisinopril metoprolol).  6-PAF/flutter valve -Patient is status post pacemaker -Continue metoprolol and Eliquis -Rate control and denying palpitations currently.   7-GERD -Continue famotidine  8-physical deconditioning -as mentioned above seen by PT and recommending SNF for rehab.  9-urinary retention -Patient denies dysuria -Foley catheter has been removed and the patient has been able to be on her own.   -Acute retention most likely associated with use of pain meds  10-HLD -will continue Zocor   11-constipation -Will use Colace and MiraLAX -Patient advised to keep herself well-hydrated.  Code Status: Full Family Communication: daughter at bedside  Disposition Plan: continue supportive care;  minimize use of IV narcotics. Voiding trial in am. Will need SNF at discharge.   Consultants:  None   Procedures:  See below for x-ray reports   Antibiotics:  None   HPI/Subjective: No fever, no chest pain, no shortness of breath, no nausea, no vomiting.  Patient continued experiencing lower back pain (especially with movement and repositioning).  Denies dysuria.  Objective: Vitals:   05/01/17 0931 05/01/17 1547  BP: (!) 140/56 (!) 124/54  Pulse: 80 91  Resp:  (!) 22  Temp: 98.4 F (36.9 C) 98.2 F (36.8 C)  SpO2: 100% 100%    Intake/Output Summary (Last 24 hours) at 05/01/17 1828 Last data filed at 05/01/17 1548  Gross per 24 hour  Intake              120 ml  Output             1200 ml  Net            -1080 ml   Filed Weights   04/29/17 0547 04/30/17 0500 05/01/17 0448  Weight: 47.4 kg (104 lb 8 oz) 49 kg (108 lb 0.4 oz) 48.6 kg (107 lb 2.3 oz)    Exam:   General: Afebrile, no chest pain, no shortness of breath.  Patient reports having some lower back pain, no nausea, no vomiting.  Denies dysuria and has been able to be after removing Foley catheter.  Cardiovascular: S1 and S2, no rubs, no gallops, positive systolic ejection murmur.  No JVD.    Respiratory: Normal respiratory effort, no wheezing, no crackles, good oxygen saturation on room air.  Abdomen: Soft, nontender, nondistended, positive bowel sounds.  Musculoskeletal: No edema, no cyanosis, no clubbing   Data Reviewed: Basic Metabolic Panel:  Recent Labs Lab 04/28/17 2105 04/29/17 0616 04/30/17 0433 04/30/17 1045 05/01/17 0424  NA 121* 127*  130* 130* 129*  K 2.9* 2.9* 5.7* 5.9* 4.9  CL 78* 89* 97* 96* 98*  CO2 25 29 26 26 25   GLUCOSE 132* 121* 93 127* 103*  BUN 13 10 15 14 20   CREATININE 0.76 0.70 0.87 0.88 0.69  CALCIUM 9.5 9.1 9.0 9.2 9.1  MG 1.4* 1.7 2.0  --   --    Liver Function Tests:  Recent Labs Lab 04/28/17 2105 04/29/17 0616  AST 40 33  ALT 19 18  ALKPHOS 101 85   BILITOT 1.4* 0.9  PROT 6.8 5.9*  ALBUMIN 4.4 3.8    Recent Labs Lab 04/28/17 2105  LIPASE 52*   CBC:  Recent Labs Lab 04/28/17 2011 04/29/17 0616 04/30/17 0433  WBC 11.2* 8.9 8.5  HGB 13.9 13.0 11.5*  HCT 39.3 37.1 33.7*  MCV 82.4 82.4 85.3  PLT 295 233 226   Cardiac Enzymes:  Recent Labs Lab 04/28/17 2105 04/29/17 0335 04/29/17 0928 04/29/17 1435  TROPONINI 0.05* 0.08* 0.08* 0.06*    Recent Results (from the past 240 hour(s))  Urine C&S     Status: None   Collection Time: 04/28/17  8:11 PM  Result Value Ref Range Status   Specimen Description URINE, CATHETERIZED  Final   Special Requests NONE  Final   Culture   Final    NO GROWTH Performed at [facility], [place] [address] [place], [place] [address]    Report Status 04/30/2017 FINAL  Final     Studies: No results found.  Scheduled Meds: . apixaban  2.5 mg Oral BID  . docusate sodium  100 mg Oral BID  . famotidine  20 mg Oral BID  . lisinopril  40 mg Oral Daily  . metoprolol succinate  50 mg Oral Daily  . nortriptyline  10 mg Oral QHS  . polyethylene glycol  17 g Oral Daily  . pregabalin  75 mg Oral BID  . simvastatin  40 mg Oral QHS  . Vitamin D (Ergocalciferol)  50,000 Units Oral Q7 days  . zolpidem  5 mg Oral QHS   Continuous Infusions:  Principal Problem:   Hyponatremia Active Problems:   Aortic stenosis   Back pain   Elevated troponin    Time spent: 25 minutes    Barton Dubois MD  [facility] Pager [phone]. If 7PM-7AM, please contact night-coverage at [email or web address], password [facility] 05/01/2017, 6:28 PM  LOS: 3 days

## 2017-05-01 NOTE — NC FL2 (Signed)
[place] LEVEL OF CARE SCREENING TOOL     IDENTIFICATION  Patient Name: Erin Charles Birthdate: 06/04/1923 Sex: female Admission Date (Current Location): 04/28/2017  [facility] and [place] Number:  Herbalist and Address:  [facility],  [place], [place]      Provider Number: [id number]  Attending Physician Name and Address:  Barton Dubois, MD  Relative Name and Phone Number:       Current Level of Care: Hospital Recommended Level of Care: [place] Prior Approval Number:    Date Approved/Denied:   PASRR Number: [id number]  Discharge Plan: SNF    Current Diagnoses: Patient Active Problem List   Diagnosis Date Noted  . Hypokalemia   . Hyponatremia 04/28/2017  . Back pain 04/28/2017  . Elevated troponin 04/28/2017  . Mixed hyperlipidemia 08/13/2016  . Ductal carcinoma in situ (DCIS) of left breast 07/21/2015  . Traumatic ecchymosis of groin 08/18/2014  . Trochanteric bursitis of right hip 03/03/2014  . Osteoarthritis of hip 06/07/2013  . DJD (degenerative joint disease) of knee 06/07/2013  . Encounter for long-term (current) use of other medications 06/07/2013  . Spinal stenosis, lumbar region, without neurogenic claudication 06/07/2013  . Atrial fibrillation ([place]) 03/30/2013  . Atrioventricular block, complete ([place]) 12/12/2011  . Unstable angina ([place]) 08/04/2011  . Hypertension 09/27/2010  . Pacemaker-St.Jude 09/27/2010  . Aortic stenosis 09/27/2010    Orientation RESPIRATION BLADDER Height & Weight     Self, Time, Situation, Place  Normal Incontinent Weight: 107 lb 2.3 oz (48.6 kg) Height:  4\' 11"  (149.9 cm)  BEHAVIORAL SYMPTOMS/MOOD NEUROLOGICAL BOWEL NUTRITION STATUS      Continent Diet (heart healthy, thin fluid consistency)  AMBULATORY STATUS COMMUNICATION OF NEEDS Skin   Limited Assist   Normal                       Personal Care Assistance Level of Assistance   Bathing, Feeding, Dressing Bathing Assistance: Limited assistance Feeding assistance: Independent Dressing Assistance: Independent     Functional Limitations Info  Sight, Hearing, Speech Sight Info: Adequate Hearing Info: Impaired (hard of hearing) Speech Info: Adequate    SPECIAL CARE FACTORS FREQUENCY  PT (By licensed PT), OT (By licensed OT)     PT Frequency: 5x OT Frequency: 5x            Contractures Contractures Info: Not present    Additional Factors Info  Code Status, Allergies Code Status Info: full code Allergies Info: Macrodantin, Penicillins, Tolectin Tolmetin Sodium           Current Medications (05/01/2017):  This is the current hospital active medication list Current Facility-Administered Medications  Medication Dose Route Frequency Provider Last Rate Last Dose  . acetaminophen-codeine (TYLENOL #3) 300-30 MG per tablet 1 tablet  1 tablet Oral Q8H PRN Jani Gravel, MD   1 tablet at 04/29/17 0504  . ALPRAZolam Duanne Moron) tablet 0.25 mg  0.25 mg Oral Q8H PRN Jani Gravel, MD   0.25 mg at 04/30/17 2128  . apixaban (ELIQUIS) tablet 2.5 mg  2.5 mg Oral BID Jani Gravel, MD   2.5 mg at 05/01/17 0959  . diphenhydrAMINE (BENADRYL) injection 25 mg  25 mg Intravenous Q6H PRN Jani Gravel, MD      . docusate sodium (COLACE) capsule 100 mg  100 mg Oral BID Barton Dubois, MD   100 mg at 05/01/17 0959  . famotidine (PEPCID) tablet 20 mg  20 mg Oral BID  Jani Gravel, MD   20 mg at 05/01/17 7544  . fluticasone (FLONASE) 50 MCG/ACT nasal spray 1 spray  1 spray Each Nare Daily PRN Jani Gravel, MD      . hydrALAZINE (APRESOLINE) injection 5 mg  5 mg Intravenous Q4H PRN Theodis Blaze, MD      . lisinopril (PRINIVIL,ZESTRIL) tablet 40 mg  40 mg Oral Daily Jani Gravel, MD   40 mg at 05/01/17 1000  . methocarbamol (ROBAXIN) tablet 500 mg  500 mg Oral Q6H PRN Jani Gravel, MD   500 mg at 04/29/17 1302  . metoprolol succinate (TOPROL-XL) 24 hr tablet 50 mg  50 mg Oral Daily Jani Gravel, MD   50  mg at 05/01/17 1000  . nitroGLYCERIN (NITROSTAT) SL tablet 0.4 mg  0.4 mg Sublingual Q5 min PRN Jani Gravel, MD      . nortriptyline (PAMELOR) capsule 10 mg  10 mg Oral Loma Sousa, MD   10 mg at 04/30/17 2127  . ondansetron (ZOFRAN) injection 4 mg  4 mg Intravenous Q6H PRN Jani Gravel, MD      . ondansetron (ZOFRAN-ODT) disintegrating tablet 8 mg  8 mg Oral Q8H PRN Jani Gravel, MD      . polyethylene glycol (MIRALAX / GLYCOLAX) packet 17 g  17 g Oral Daily Barton Dubois, MD   17 g at 05/01/17 1004  . pregabalin (LYRICA) capsule 75 mg  75 mg Oral BID Jani Gravel, MD   75 mg at 05/01/17 1000  . simvastatin (ZOCOR) tablet 40 mg  40 mg Oral Loma Sousa, MD   40 mg at 04/30/17 2127  . traMADol (ULTRAM) tablet 50 mg  50 mg Oral Q6H PRN Theodis Blaze, MD   50 mg at 05/01/17 1001  . Vitamin D (Ergocalciferol) (DRISDOL) capsule 50,000 Units  50,000 Units Oral Q7 days Jani Gravel, MD   50,000 Units at 04/30/17 0901  . zolpidem (AMBIEN) tablet 5 mg  5 mg Oral QHS Jani Gravel, MD   5 mg at 04/30/17 2127     Discharge Medications: Please see discharge summary for a list of discharge medications.  Relevant Imaging Results:  Relevant Lab Results:   Additional Information SS# [id number]  Nila Nephew, LCSW

## 2017-05-01 NOTE — Clinical Social Work Note (Signed)
Clinical Social Work Assessment  Patient Details  Name: Erin Charles MRN: [id number] Date of Birth: 1922/12/05  Date of referral:  05/01/17               Reason for consult:  Facility Placement                Permission sought to share information with:  Family Supports Permission granted to share information::  Yes, Verbal Permission Granted  Name::     husband Timmothy Sours, daughter Arbie Cookey, son Special educational needs teacher::     Relationship::     Contact Information:     Housing/Transportation Living arrangements for the past 2 months:  Single Family Home Source of Information:  Patient, Adult Children, Spouse Patient Interpreter Needed:  None Criminal Activity/Legal Involvement Pertinent to Current Situation/Hospitalization:  No - Comment as needed Significant Relationships:  Adult Children, Other Family Members, Spouse Lives with:  Self, Spouse Do you feel safe going back to the place where you live?  Yes Need for family participation in patient care:  No (Coment)   Care giving concerns:  Pt from home where she resides with her husband. At baseline ambulated with cane and no assistance. Currently ambulating with assistance and walker. Family feels husband will be unable to assist pt as much as she'll need at DC.    Social Worker assessment / plan:  CSW consulted to assess for potential SNF placement. Met with pt, husband, and son at bedside. All engaged and welcoming of CSW involvement.  Discussed recommendation for SNF for skilled PT. Family/pt in agreement that pt would benefit and this is their first choice for DC plan.  CSW explained in 3- night inpatient stay needed to qualify for [facility] coverage. They were understanding that if pt stable for DC prior to 3 night stay, they would be asked to pay out of pocket for SNF. Family states if this transpires they would likely opt to take pt home but would discuss further first.  [place] or [place] desired facilities.  Obtained PASSR, completed FL2 and  made referrals.   Plan: SNF at DC- will follow up with bed offers.   Employment status:  Retired Forensic scientist:  [employer]) PT Recommendations:  [place] / Referral to community resources:  [place]  Patient/Family's Response to care:  Engaged and appreciative of care  Patient/Family's Understanding of and Emotional Response to Diagnosis, Current Treatment, and Prognosis:  Both pt and family demonstrate adequate understanding of pt's treatment, plan, and potential barriers. All seemed emotionally well-adjusted and appropriate to situation.   Emotional Assessment Appearance:  Appears stated age Attitude/Demeanor/Rapport:   (pleasant) Affect (typically observed):  Accepting, Adaptable, Calm Orientation:  Oriented to Self, Oriented to Place, Oriented to  Time, Oriented to Situation Alcohol / Substance use:  Not Applicable Psych involvement (Current and /or in the community):  No (Comment)  Discharge Needs  Concerns to be addressed:  Discharge Planning Concerns Readmission within the last 30 days:  No Current discharge risk:  None Barriers to Discharge:  No Barriers Identified   Nila Nephew, LCSW 05/01/2017, 11:19 AM  [phone]

## 2017-05-01 NOTE — Progress Notes (Addendum)
[facility] PROGRESS NOTE  Erin Charles YTK:[id number] DOB: 1923/04/28 DOA: 04/28/2017 PCP: Hulan Fess, MD  Interim summary and HPI 83 y.o. female, w Dementia, Aortic Stenosis (severe), Aflutter, c/o back pain, intractable without radiation.  Assessment/Plan: 1-back pain: appears to be secondary to facet arthritis -unchanged known right lower pole renal stone.  -no signs of active infection or hydronephrosis -will continue PRN analgesia and PT -found weak and deconditioned, will need SNF for rehab. -follow clinical response and continue supportive care  2-elevated troponin -appears to be secondary to demand ischemia -no CP, no SOB -per cardiology service no plans on further intervention or further work up at this point.  3-hypokalemia, hyponatremia and hypomagnesemia -most likely from poor intake -will continue IVF's -continue electrolytes repletion as needed   4-dementia -continue supportive care   5-HTN -stable and well controlled -will continue current antihypertensive regimen (lisinopril and metoprolol)  6-PAF/flutter valve -status post pacemaker -will continue metoprolol and eliquis  7-GERD -continue famotidine  8-physical deconditioning -as mentioned above seen by PT and recommending SNF for rehab.  9-urinary retention -foley catheter placed -most likely associated with use of pain meds -will attempt voiding trial in am  9-HLD -continue zocor   Code Status: Full Family Communication: daughter at bedside  Disposition Plan: continue supportive care; minimize use of IV narcotics. Voiding trial in am. Will need SNF at discharge.   Consultants:  None   Procedures:  See below for x-ray reports   Antibiotics:  None   HPI/Subjective: Afebrile, no CP, no SOB. Patient with episodes of repeating questions and easily forgetting conversation/topic and interactions. Reports that pain in her back is improved.  Objective: Vitals:   04/30/17  1413 04/30/17 2044  BP: (!) 150/66 (!) 153/69  Pulse: 68 78  Resp: 18 18  Temp: 97.9 F (36.6 C) 99.4 F (37.4 C)  SpO2: 94% 98%    Intake/Output Summary (Last 24 hours) at 05/01/17 0141 Last data filed at 04/30/17 2155  Gross per 24 hour  Intake          1251.25 ml  Output             2200 ml  Net          -948.75 ml   Filed Weights   04/29/17 0547 04/30/17 0500  Weight: 47.4 kg (104 lb 8 oz) 49 kg (108 lb 0.4 oz)    Exam:   General:  Afebrile, no CP, no SOB. Patient reports some increase frequency, but no dysuria. No nausea, no vomiting.  Cardiovascular: S1 and S2, no rubs, no gallops, positive SEM.  Respiratory: CTA bilaterally  Abdomen: soft, NT, ND, positive BS  Musculoskeletal: no edema, no cyanosis   Data Reviewed: Basic Metabolic Panel:  Recent Labs Lab 04/28/17 2105 04/29/17 0616 04/30/17 0433 04/30/17 1045  NA 121* 127* 130* 130*  K 2.9* 2.9* 5.7* 5.9*  CL 78* 89* 97* 96*  CO2 25 29 26 26   GLUCOSE 132* 121* 93 127*  BUN 13 10 15 14   CREATININE 0.76 0.70 0.87 0.88  CALCIUM 9.5 9.1 9.0 9.2  MG 1.4* 1.7 2.0  --    Liver Function Tests:  Recent Labs Lab 04/28/17 2105 04/29/17 0616  AST 40 33  ALT 19 18  ALKPHOS 101 85  BILITOT 1.4* 0.9  PROT 6.8 5.9*  ALBUMIN 4.4 3.8    Recent Labs Lab 04/28/17 2105  LIPASE 52*   CBC:  Recent Labs Lab 04/28/17 2011 04/29/17 0616 04/30/17 0433  WBC  11.2* 8.9 8.5  HGB 13.9 13.0 11.5*  HCT 39.3 37.1 33.7*  MCV 82.4 82.4 85.3  PLT 295 233 226   Cardiac Enzymes:  Recent Labs Lab 04/28/17 2105 04/29/17 0335 04/29/17 0928 04/29/17 1435  TROPONINI 0.05* 0.08* 0.08* 0.06*    Recent Results (from the past 240 hour(s))  Urine C&S     Status: None   Collection Time: 04/28/17  8:11 PM  Result Value Ref Range Status   Specimen Description URINE, CATHETERIZED  Final   Special Requests NONE  Final   Culture   Final    NO GROWTH Performed at [facility], [place] [address],  [place], [place] [address]    Report Status 04/30/2017 FINAL  Final     Studies: No results found.  Scheduled Meds: . apixaban  2.5 mg Oral BID  . famotidine  20 mg Oral BID  . lisinopril  40 mg Oral Daily  . metoprolol succinate  50 mg Oral Daily  . nortriptyline  10 mg Oral QHS  . pregabalin  75 mg Oral BID  . simvastatin  40 mg Oral QHS  . Vitamin D (Ergocalciferol)  50,000 Units Oral Q7 days  . zolpidem  5 mg Oral QHS   Continuous Infusions:  Principal Problem:   Hyponatremia Active Problems:   Aortic stenosis   Back pain   Elevated troponin    Time spent: 30 minutes    Barton Dubois MD  [facility] Pager [phone]. If 7PM-7AM, please contact night-coverage at [email or web address], password [facility] 05/01/2017, 1:41 AM  LOS: 3 days

## 2017-05-02 DIAGNOSIS — F419 Anxiety disorder, unspecified: Secondary | ICD-10-CM

## 2017-05-02 DIAGNOSIS — R339 Retention of urine, unspecified: Secondary | ICD-10-CM

## 2017-05-02 DIAGNOSIS — F0390 Unspecified dementia without behavioral disturbance: Secondary | ICD-10-CM

## 2017-05-02 DIAGNOSIS — G8929 Other chronic pain: Secondary | ICD-10-CM

## 2017-05-02 DIAGNOSIS — R5381 Other malaise: Secondary | ICD-10-CM

## 2017-05-02 MED ORDER — TRAMADOL HCL 50 MG PO TABS: 50.0000 mg | ORAL_TABLET | Freq: Three times a day (TID) | ORAL | 0 refills | Status: AC | PRN

## 2017-05-02 MED ORDER — ACETAMINOPHEN-CODEINE #3 300-30 MG PO TABS: 1.0000 | ORAL_TABLET | Freq: Three times a day (TID) | ORAL | 0 refills | Status: DC | PRN

## 2017-05-02 MED ORDER — POLYETHYLENE GLYCOL 3350 17 G PO PACK: 17.0000 g | PACK | Freq: Every day | ORAL | Status: DC

## 2017-05-02 MED ORDER — DOCUSATE SODIUM 100 MG PO CAPS: 100.0000 mg | ORAL_CAPSULE | Freq: Two times a day (BID) | ORAL | Status: DC

## 2017-05-02 MED ORDER — METHOCARBAMOL 500 MG PO TABS: 500.0000 mg | ORAL_TABLET | Freq: Four times a day (QID) | ORAL | Status: DC | PRN

## 2017-05-02 MED ORDER — ALPRAZOLAM 0.25 MG PO TABS: 0.2500 mg | ORAL_TABLET | Freq: Three times a day (TID) | ORAL | 0 refills | Status: DC | PRN

## 2017-05-02 NOTE — Discharge Summary (Signed)
Physician Discharge Summary  Erin Charles NLZ:[id number] DOB: 12/11/1922 DOA: 04/28/2017  PCP: Hulan Fess, MD  Admit date: 04/28/2017 Discharge date: 05/02/2017  Time spent: 35 minutes  Recommendations for Outpatient Follow-up:  Repeat BMET in 5 days to follow electrolytes and renal function  Take medications as prescribed  Maintain adequate hydration Reassess BP and adjust antihypertensive regimen as needed  Please follow lower back and adjust pain meds as needed (careful with side effects from narcotics; patient gets confused)   Discharge Diagnoses:  Principal Problem:   Hyponatremia Active Problems:   Aortic stenosis   Back pain   Elevated troponin   Urinary retention   Physical deconditioning   Dementia without behavioral disturbance   Anxiety   Discharge Condition: stable and improved. Will discharge to SNF for further care and rehabilitation.  Diet recommendation: heart healthy diet   Filed Weights   04/30/17 0500 05/01/17 0448 05/02/17 0457  Weight: 49 kg (108 lb 0.4 oz) 48.6 kg (107 lb 2.3 oz) 47.6 kg (104 lb 15 oz)    History of present illness:  As per H&P written by Dr. Maudie Mercury on 04/28/17; but briefly: 83 y.o.female,w Dementia, Aortic Stenosis (severe), Aflutter, c/o back pain, intractable without radiation.  Hospital Course:  1-back pain: appears to be secondary to facet arthritis -unchanged known right lower pole renal stone.  -no signs of active infection or hydronephrosis -will continue PRN analgesia and PT -found weak and deconditioned, will discharge to SNF for rehab. -follow clinical response and continue adjusting pain meds as needed for better control.  2-elevated troponin -appears to be secondary to demand ischemia -no CP, no SOB -per cardiology service no plans on further intervention or further work up at this point. -Patient has remained chest pain-free.  3-hypokalemia, hyponatremia and hypomagnesemia -most likely from poor  intake, use of HCTZ's and dehydration -repleted with IVF's -HCTZ's discontinued at discharge -Will recommend BMET in 5 days to follow electrolytes trend and to continue repletion as needed  4-dementia -Continue supportive care  -Able to follow commands and is easily redirected  5-HTN -Stable and well controlled. -Will continue current antihypertensive regimen (lisinopril, metoprolol). -HCTZ's discontinued as mentioned above; please reassess BP  6-PAF/flutter valve -Patient is status post pacemaker -Continue metoprolol and Eliquis -Rate control and denying palpitations currently.   7-GERD -Continue famotidine  8-physical deconditioning -as mentioned above seen by PT and recommending SNF for rehab.  9-urinary retention -Patient denies dysuria -Foley catheter has been removed and the patient has been able to be on her own.   -Acute retention most likely associated with use of pain meds  10-HLD -will continue Zocor   11-constipation -Will use Colace and MiraLAX -Patient advised to keep herself well-hydrated.  12-anxiety -continue PRN xanax   Procedures: See below for x-ray reports   Consultations:  Cardiology   Discharge Exam: Vitals:   05/01/17 2047 05/02/17 0457  BP: 135/65 (!) 114/47  Pulse: 84 72  Resp: 20 18  Temp: 99.1 F (37.3 C) 98.1 F (36.7 C)  SpO2: 93% 97%    General: Afebrile, no chest pain, no shortness of breath.  Patient reports still having some lower back pain, no nausea, no vomiting.  Denies dysuria and has been able to be after removing Foley catheter on 11/1.  Cardiovascular: S1 and S2, no rubs, no gallops, positive systolic ejection murmur.  No JVD.    Respiratory: Normal respiratory effort, no wheezing, no crackles, good oxygen saturation on room air.  Abdomen: Soft, nontender, nondistended, positive  bowel sounds.  Musculoskeletal: No edema, no cyanosis, no clubbing    Discharge Instructions   Discharge Instructions     Diet - low sodium heart healthy    Complete by:  As directed    Discharge instructions    Complete by:  As directed    Repeat BMET in 5 days to follow electrolytes and renal function  Take medications as prescribed  Maintain adequate hydration Reassess BP and adjust antihypertensive regimen as needed  Please follow lower back and adjust pain meds as needed (careful with side effects from narcotics; patient gets confused) Physical therapy and further rehabilitation/conditioning as per SNF protocol     Current Discharge Medication List    START taking these medications   Details  docusate sodium (COLACE) 100 MG capsule Take 1 capsule (100 mg total) by mouth 2 (two) times daily.    methocarbamol (ROBAXIN) 500 MG tablet Take 1 tablet (500 mg total) by mouth every 6 (six) hours as needed for muscle spasms.    polyethylene glycol (MIRALAX / GLYCOLAX) packet Take 17 g by mouth daily.      CONTINUE these medications which have CHANGED   Details  acetaminophen-codeine (TYLENOL #3) 300-30 MG tablet Take 1 tablet by mouth every 8 (eight) hours as needed for moderate pain. Qty: 10 tablet, Refills: 0    ALPRAZolam (XANAX) 0.25 MG tablet Take 1 tablet (0.25 mg total) by mouth every 8 (eight) hours as needed for anxiety. Qty: 10 tablet, Refills: 0    traMADol (ULTRAM) 50 MG tablet Take 1 tablet (50 mg total) by mouth every 8 (eight) hours as needed for moderate pain or severe pain (not relieved by tylenol #3). Qty: 15 tablet, Refills: 0      CONTINUE these medications which have NOT CHANGED   Details  clindamycin (CLEOCIN) 150 MG capsule Take by mouth as needed. 4 capsules 1 hour prior to dental work Refills: 2    ELIQUIS 2.5 MG TABS tablet TAKE 1 TABLET BY MOUTH TWICE A DAY Qty: 180 tablet, Refills: 1    famotidine (PEPCID) 20 MG tablet Take 1 tablet (20 mg total) by mouth 2 (two) times daily. Qty: 10 tablet, Refills: 0    fluticasone (FLONASE) 50 MCG/ACT nasal spray Place 1 spray  into both nostrils daily as needed for allergies.     LYRICA 75 MG capsule Take 75 mg by mouth 2 (two) times daily.    metoprolol succinate (TOPROL-XL) 50 MG 24 hr tablet TAKE 1 TABLET BY MOUTH DAILY Qty: 30 tablet, Refills: 9    NITROSTAT 0.4 MG SL tablet PLACE 1 TABLET (0.4 MG TOTAL) UNDER THE TONGUE EVERY 5 (FIVE) MINUTES AS NEEDED FOR CHEST PAIN. Qty: 25 tablet, Refills: 3   Associated Diagnoses: Coronary artery disease involving native coronary artery of native heart with unstable angina pectoris (HCC)    nortriptyline (PAMELOR) 10 MG capsule Take 10 mg by mouth at bedtime.    ondansetron (ZOFRAN ODT) 8 MG disintegrating tablet Take 1 tablet (8 mg total) by mouth every 8 (eight) hours as needed for nausea or vomiting. Qty: 20 tablet, Refills: 0    quinapril (ACCUPRIL) 40 MG tablet Take 40 mg by mouth daily.     simvastatin (ZOCOR) 40 MG tablet Take 40 mg by mouth at bedtime.     Vitamin D, Ergocalciferol, (DRISDOL) 50000 UNITS CAPS capsule Take 50,000 Units by mouth every 7 (seven) days.     zolpidem (AMBIEN) 10 MG tablet Take 10 mg by mouth at  bedtime.      STOP taking these medications     hydrochlorothiazide 25 MG tablet      KLOR-CON M10 10 MEQ tablet      anastrozole (ARIMIDEX) 1 MG tablet        Allergies  Allergen Reactions  . Macrodantin Other (See Comments)    rash  . Penicillins Rash and Other (See Comments)  . Tolectin [Tolmetin Sodium] Rash   Follow-up Information    Little, Lennette Bihari, MD. Schedule an appointment as soon as possible for a visit in 10 day(s).   Specialty:  Family Medicine Why:  after discharge from SNF Contact information: [address] [place] [address] [phone]            The results of significant diagnostics from this hospitalization (including imaging, microbiology, ancillary and laboratory) are listed below for reference.    Significant Diagnostic Studies: Dg Chest 2 View  Result Date: 04/28/2017 CLINICAL  DATA:  83 year old female mid chest pain. EXAM: CHEST  2 VIEW COMPARISON:  Chest radiograph dated 10/30/2015 FINDINGS: There is emphysematous changes of the lungs. No focal consolidation, pleural effusion, or pneumothorax. There is cardiomegaly. There is atherosclerotic calcification of the aorta. Left pectoral pacemaker device. There is osteopenia with degenerative changes of the spine and shoulders. No acute osseous pathology. IMPRESSION: No active cardiopulmonary disease. Electronically Signed   By: Anner Crete M.D.   On: 04/28/2017 21:31   Ct Abdomen Pelvis W Contrast  Result Date: 04/28/2017 CLINICAL DATA:  Left flank pain and left lower quadrant pain. EXAM: CT ABDOMEN AND PELVIS WITH CONTRAST TECHNIQUE: Multidetector CT imaging of the abdomen and pelvis was performed using the standard protocol following bolus administration of intravenous contrast. CONTRAST:  132mL ISOVUE-300 IOPAMIDOL (ISOVUE-300) INJECTION 61% COMPARISON:  CT abdomen pelvis 03/01/2015 FINDINGS: Lower chest: No pulmonary nodules or pleural effusion. No visible pericardial effusion. Hepatobiliary: Normal hepatic contours and density. No visible biliary dilatation. Normal gallbladder. Pancreas: Normal contours without ductal dilatation. No peripancreatic fluid collection. Spleen: Normal. Adrenals/Urinary Tract: --Adrenal glands: Normal. --Right kidney/ureter: Lobulated contour, unchanged. Large calculus at the lower pole is also unchanged, measuring 7 mm. --Left kidney/ureter: Unchanged lobulated contour. No hydronephrosis. --Urinary bladder: Unremarkable. Stomach/Bowel: --Stomach/Duodenum: No hiatal hernia or other gastric abnormality. Normal duodenal course and caliber. --Small bowel: No dilatation or inflammation. --Colon: There is rectosigmoid diverticulosis without acute inflammation. --Appendix: Normal. Vascular/Lymphatic: Suprarenal aortic atherosclerotic calcification. No abdominal or pelvic lymphadenopathy. Reproductive:  Normal uterus.  No adnexal mass. Musculoskeletal. Severe right hip osteoarthrosis. Multilevel lumbar degenerative disc disease and facet arthrosis. Grade 1 anterolisthesis at L5-S1 due to facet hypertrophy. Other: None. IMPRESSION: 1. No obstructive uropathy. Unchanged large right lower pole renal stone. 2. Rectosigmoid diverticulosis without acute inflammation. 3.  Aortic Atherosclerosis (ICD10-I70.0). Electronically Signed   By: Ulyses Jarred M.D.   On: 04/28/2017 22:42    Microbiology: Recent Results (from the past 240 hour(s))  Urine C&S     Status: None   Collection Time: 04/28/17  8:11 PM  Result Value Ref Range Status   Specimen Description URINE, CATHETERIZED  Final   Special Requests NONE  Final   Culture   Final    NO GROWTH Performed at [facility], [address] [place], [place] [address]    Report Status 04/30/2017 FINAL  Final     Labs: Basic Metabolic Panel:  Recent Labs Lab 04/28/17 2105 04/29/17 0616 04/30/17 0433 04/30/17 1045 05/01/17 0424  NA 121* 127* 130* 130* 129*  K  2.9* 2.9* 5.7* 5.9* 4.9  CL 78* 89* 97* 96* 98*  CO2 25 29 26 26 25   GLUCOSE 132* 121* 93 127* 103*  BUN 13 10 15 14 20   CREATININE 0.76 0.70 0.87 0.88 0.69  CALCIUM 9.5 9.1 9.0 9.2 9.1  MG 1.4* 1.7 2.0  --   --    Liver Function Tests:  Recent Labs Lab 04/28/17 2105 04/29/17 0616  AST 40 33  ALT 19 18  ALKPHOS 101 85  BILITOT 1.4* 0.9  PROT 6.8 5.9*  ALBUMIN 4.4 3.8    Recent Labs Lab 04/28/17 2105  LIPASE 52*   CBC:  Recent Labs Lab 04/28/17 2011 04/29/17 0616 04/30/17 0433  WBC 11.2* 8.9 8.5  HGB 13.9 13.0 11.5*  HCT 39.3 37.1 33.7*  MCV 82.4 82.4 85.3  PLT 295 233 226   Cardiac Enzymes:  Recent Labs Lab 04/28/17 2105 04/29/17 0335 04/29/17 0928 04/29/17 1435  TROPONINI 0.05* 0.08* 0.08* 0.06*    Signed:  Barton Dubois MD.  [facility] 05/02/2017, 8:24 AM

## 2017-05-02 NOTE — Progress Notes (Signed)
Attempted to call report to [facility] by calling [phone]. Phone went straight to voice mail with no indication that the number was HIPPA secured.  Called main number for [facility] and was transferred to nursing station where no one answered phone.  Will attempt to call back later. Jillene Wehrenberg A

## 2017-05-02 NOTE — Clinical Social Work Placement (Signed)
Patient received and accepted bed offer at [facility]. Facility aware of patient's discharge and confirmed bed offer. PTAR contacted, patient's family notified. Patient's RN can call report to [phone] room 801A on [facility], packet complete. CSW signing off, no other needs identified.   CLINICAL SOCIAL WORK PLACEMENT  NOTE  Date:  05/02/2017  Patient Details  Name: Erin Charles MRN: [id number] Date of Birth: 13-Jun-1923  Clinical Social Work is seeking post-discharge placement for this patient at the [place] level of care (*CSW will initial, date and re-position this form in  chart as items are completed):  Yes   Patient/family provided with [place] Work Department's list of facilities offering this level of care within the geographic area requested by the patient (or if unable, by the patient's family).  Yes   Patient/family informed of their freedom to choose among providers that offer the needed level of care, that participate in Medicare, Medicaid or managed care program needed by the patient, have an available bed and are willing to accept the patient.  Yes   Patient/family informed of [facility]'s ownership interest in [facility] and [facility], as well as of the fact that they are under no obligation to receive care at these facilities.  PASRR submitted to EDS on 05/01/17     PASRR number received on 05/01/17     Existing PASRR number confirmed on       FL2 transmitted to all facilities in geographic area requested by pt/family on 05/01/17     FL2 transmitted to all facilities within larger geographic area on       Patient informed that his/her managed care company has contracts with or will negotiate with certain facilities, including the following:        Yes   Patient/family informed of bed offers received.  Patient chooses bed at [facility]     Physician recommends and patient chooses bed at       Patient to be transferred to [facility] on 05/02/17.  Patient to be transferred to facility by PTAR     Patient family notified on 05/02/17 of transfer.  Name of family member notified:  Arbie Cookey coke     PHYSICIAN       Additional Comment:    _______________________________________________ Burnis Medin, LCSW 05/02/2017, 10:20 AM

## 2017-05-13 ENCOUNTER — Telehealth: Payer: Self-pay | Admitting: *Deleted

## 2017-05-13 MED ORDER — ANASTROZOLE 1 MG PO TABS: 1.0000 mg | ORAL_TABLET | Freq: Every day | ORAL | 1 refills | Status: AC

## 2017-05-13 NOTE — Telephone Encounter (Signed)
Received refill request for anastrozole.  Pt has not been seen since 01/22/16.  A couple of appts have been cancelled since that date.  Called & left message for pt to return call to discuss f/u.  Refilled anastrozole for now.  Called daughter, Arbie Cookey & she reports that pt is at [facility] will probably be there another week but wishes to continue f/u.  Message sent to schedulers to call Arbie Cookey for f/u appt with Dr Burr Medico.

## 2017-05-20 ENCOUNTER — Telehealth: Payer: Self-pay | Admitting: Hematology

## 2017-05-20 NOTE — Telephone Encounter (Signed)
Scheduled appt per 11/13 sch msg - called daughter and her voicemailbox was full. Sending confirmation letter in the mail.

## 2017-05-21 ENCOUNTER — Ambulatory Visit: Payer: Federal, State, Local not specified - PPO | Admitting: Hematology

## 2017-05-26 ENCOUNTER — Ambulatory Visit: Payer: Federal, State, Local not specified - PPO | Admitting: Nurse Practitioner

## 2017-06-09 ENCOUNTER — Ambulatory Visit: Payer: Federal, State, Local not specified - PPO | Admitting: Cardiovascular Disease

## 2017-06-11 ENCOUNTER — Encounter: Payer: Self-pay | Admitting: Cardiovascular Disease

## 2017-06-11 ENCOUNTER — Ambulatory Visit (INDEPENDENT_AMBULATORY_CARE_PROVIDER_SITE_OTHER): Payer: Federal, State, Local not specified - PPO | Admitting: Cardiovascular Disease

## 2017-06-11 VITALS — BP 110/42 | HR 79 | Ht 59.0 in | Wt 110.8 lb

## 2017-06-11 DIAGNOSIS — I10 Essential (primary) hypertension: Secondary | ICD-10-CM

## 2017-06-11 DIAGNOSIS — I350 Nonrheumatic aortic (valve) stenosis: Secondary | ICD-10-CM

## 2017-06-11 NOTE — Patient Instructions (Signed)

## 2017-06-11 NOTE — Progress Notes (Signed)
Cardiology Office Note   Date:  06/11/2017   ID:  Erin Charles, DOB 10/21/1922, MRN [id number]  PCP:  Hulan Fess, MD  Cardiologist:   Mertie Moores, MD   Chief Complaint  Patient presents with  . Aortic Stenosis   Problem List: 1. Hypertension 2. Aortic stenosis - severe , mean gradient of 42, peak gradient of 69. 3. Chest pain 4. Pre-canerous breast mass 5. Macular degeneration 6. Pacer -St. Jude dual- chamber pacemaker 08/29/10 7. Atrial fib / flutter    History of Present Illness: Erin Charles is a 83 y.o. female who presents for evalualtion of some episodes of chest discomfort. Pt complains of CP - only with walking .  Better with rest.  For the past  Several weeks , worse over the past 3 days.   Last for several minutes.   Mid / left upper chest , radiates around to the back .  Not associated with dyspnea, no sweats, no pre-syncope  Nov 03, 2014: Erin Charles had a cardiac cath last month.  She has no significant CAD and has significant Aortic stenosis.   She has not wanted to go for TAVR - is here to discuss.   Nov. 1, 2016:  Erin Charles is seen today for follow up of aortic stenosis .  No CP or dyspnea.  Walking with a cane.  Limited by her arthritis. .  Jan. 31, 2017:  Doing ok. No CP or dyspnea.    BP is a bit higher today .  Has been better on previous months   Aug . 16, 2017:  Doing well from a cardiac standpoint.  Has a UTI. breathig is good. No CP .    Feb. 13, 2018 Doing well Had a GI bug for the past several weeks .  Seems to be getting over it now   Nov 22, 2016: Doing well Is in atrial flutter  - cannot tell  ,  no palpitations Complains of right thigh pain due to bursitis.  No CP or dyspnea  Dec.   12, 2018:  has lost lots of weight.  Steady weight loss ofer the past several years  Has not been eating well  Erin Charles is looking for assisted living  No CP or dyspnea.     No syncope  Is walking with a walker.    Past Medical  History:  Diagnosis Date  . Advanced age   . Atrial flutter ([place])   . Chronic pain    a. followed in pain mgmt.  . Complete heart block ([place])    Pacer  . 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 Dyslipidemia   . HTN (hypertension)   . Macular degeneration   . Mild CAD    a. LHC 08/2014: 40% D1, 30% LM.  . OA (osteoarthritis)   . PAF (paroxysmal atrial fibrillation) ([place])   . RBBB (right bundle branch block)   . Severe aortic stenosis    a. pt has declined TAVR.    Past Surgical History:  Procedure Laterality Date  . EYE SURGERY    . HERNIA REPAIR    . INSERT / REPLACE / REMOVE PACEMAKER  08/30/10   DUAL CHAMBER/ST. JUDE  . LEFT AND RIGHT HEART CATHETERIZATION WITH CORONARY ANGIOGRAM N/A 08/09/2014   Procedure: LEFT AND RIGHT HEART CATHETERIZATION WITH CORONARY ANGIOGRAM;  Surgeon: Burnell Blanks, MD;  Location: [facility] CATH LAB;  Service: Cardiovascular;  Laterality: N/A;  . PACEMAKER INSERTION    .  PILONIDAL CYST EXCISION       Current Outpatient Medications  Medication Sig Dispense Refill  . acetaminophen-codeine (TYLENOL #3) 300-30 MG tablet Take 1 tablet by mouth every 8 (eight) hours as needed for moderate pain. 10 tablet 0  . ALPRAZolam (XANAX) 0.25 MG tablet Take 1 tablet (0.25 mg total) by mouth every 8 (eight) hours as needed for anxiety. 10 tablet 0  . anastrozole (ARIMIDEX) 1 MG tablet Take 1 tablet (1 mg total) daily by mouth. 90 tablet 1  . clindamycin (CLEOCIN) 150 MG capsule Take by mouth as needed. 4 capsules 1 hour prior to dental work  2  . docusate sodium (COLACE) 100 MG capsule Take 1 capsule (100 mg total) by mouth 2 (two) times daily.    Marland Kitchen ELIQUIS 2.5 MG TABS tablet TAKE 1 TABLET BY MOUTH TWICE A DAY 180 tablet 1  . fluticasone (FLONASE) 50 MCG/ACT nasal spray Place 1 spray into both nostrils daily as needed for allergies.     Marland Kitchen LYRICA 75 MG capsule Take 75 mg by mouth 2 (two) times daily.    . methocarbamol (ROBAXIN) 500 MG tablet  Take 1 tablet (500 mg total) by mouth every 6 (six) hours as needed for muscle spasms.    . metoprolol succinate (TOPROL-XL) 50 MG 24 hr tablet TAKE 1 TABLET BY MOUTH DAILY 30 tablet 9  . NITROSTAT 0.4 MG SL tablet PLACE 1 TABLET (0.4 MG TOTAL) UNDER THE TONGUE EVERY 5 (FIVE) MINUTES AS NEEDED FOR CHEST PAIN. 25 tablet 3  . nortriptyline (PAMELOR) 10 MG capsule Take 10 mg by mouth at bedtime.    . polyethylene glycol (MIRALAX / GLYCOLAX) packet Take 17 g by mouth daily.    . quinapril (ACCUPRIL) 40 MG tablet Take 40 mg by mouth daily.     . simvastatin (ZOCOR) 40 MG tablet Take 40 mg by mouth at bedtime.     . traMADol (ULTRAM) 50 MG tablet Take 1 tablet (50 mg total) by mouth every 8 (eight) hours as needed for moderate pain or severe pain (not relieved by tylenol #3). 15 tablet 0  . Vitamin D, Ergocalciferol, (DRISDOL) 50000 UNITS CAPS capsule Take 50,000 Units by mouth every 7 (seven) days.     Marland Kitchen zolpidem (AMBIEN) 10 MG tablet Take 10 mg by mouth at bedtime.     No current facility-administered medications for this visit.     Allergies:   Macrodantin; Penicillins; and Tolectin [tolmetin sodium]    Social History:  The patient  reports that  has never smoked. she has never used smokeless tobacco. She reports that she does not drink alcohol or use drugs.   Family History:  The patient's family history includes Cancer in her mother; Heart attack in her sister; Heart failure in her father.    ROS: As noted in the current history.  All other systems are negative   Physical Exam: Blood pressure (!) 110/42, pulse 79, height 4\' 11"  (1.499 m), weight 110 lb 12.8 oz (50.3 kg), SpO2 90 %.  GEN:  Well nourished, well developed in no acute distress HEENT: Normal NECK: No JVD; No carotid bruits LYMPHATICS: No lymphadenopathy CARDIAC: RR,  3/6 systolic murmur  RESPIRATORY:  Clear to auscultation without rales, wheezing or rhonchi  ABDOMEN: Soft, non-tender, non-distended MUSCULOSKELETAL:  No  edema; No deformity  SKIN: Warm and dry NEUROLOGIC:  Alert and oriented x 3    EKG:       Recent Labs: 04/29/2017: ALT 18; TSH 1.804 04/30/2017:  Hemoglobin 11.5; Magnesium 2.0; Platelets 226 05/01/2017: BUN 20; Creatinine, Ser 0.69; Potassium 4.9; Sodium 129    Lipid Panel    Component Value Date/Time   CHOL 131 08/13/2016 1140   TRIG 147 08/13/2016 1140   HDL 52 08/13/2016 1140   CHOLHDL 2.5 08/13/2016 1140   CHOLHDL 3.1 08/04/2011 0610   VLDL 31 08/04/2011 0610   LDLCALC 50 08/13/2016 1140      Wt Readings from Last 3 Encounters:  06/11/17 110 lb 12.8 oz (50.3 kg)  05/02/17 104 lb 15 oz (47.6 kg)  11/22/16 115 lb 12.8 oz (52.5 kg)      Other studies Reviewed: Additional studies/ records that were reviewed today include: . Review of the above records demonstrates:    ASSESSMENT AND PLAN:   1. Hypertension -   2. Aortic stenosis - severe , mean gradient of 42, peak gradient of 69. - would not suggest TAVR.      Continue conservative care    3.  Atrial flutter :      4. Pre-canerous breast mass 5. Macular degeneration 6. Pacer -St. Jude dual-  Chamber   Current medicines are reviewed at length with the patient today.  The patient does not have concerns regarding medicines.  The following changes have been made:  no change  Disposition:   FU with me in 3 months     Signed, Mertie Moores, MD  06/11/2017 4:30 PM    [address], [place], [place]  [address] Phone: [phone]; Fax: [phone]

## 2017-07-25 ENCOUNTER — Encounter: Payer: Federal, State, Local not specified - PPO | Attending: Physical Medicine & Rehabilitation

## 2017-07-25 ENCOUNTER — Encounter: Payer: Self-pay | Admitting: Physical Medicine & Rehabilitation

## 2017-07-25 ENCOUNTER — Ambulatory Visit (HOSPITAL_BASED_OUTPATIENT_CLINIC_OR_DEPARTMENT_OTHER): Payer: Federal, State, Local not specified - PPO | Admitting: Physical Medicine & Rehabilitation

## 2017-07-25 VITALS — BP 150/68 | HR 70

## 2017-07-25 DIAGNOSIS — Z955 Presence of coronary angioplasty implant and graft: Secondary | ICD-10-CM | POA: Diagnosis not present

## 2017-07-25 DIAGNOSIS — E785 Hyperlipidemia, unspecified: Secondary | ICD-10-CM | POA: Diagnosis not present

## 2017-07-25 DIAGNOSIS — M810 Age-related osteoporosis without current pathological fracture: Secondary | ICD-10-CM | POA: Insufficient documentation

## 2017-07-25 DIAGNOSIS — M48061 Spinal stenosis, lumbar region without neurogenic claudication: Secondary | ICD-10-CM | POA: Diagnosis not present

## 2017-07-25 DIAGNOSIS — M7061 Trochanteric bursitis, right hip: Secondary | ICD-10-CM | POA: Diagnosis not present

## 2017-07-25 DIAGNOSIS — Z79899 Other long term (current) drug therapy: Secondary | ICD-10-CM | POA: Diagnosis not present

## 2017-07-25 DIAGNOSIS — Z8249 Family history of ischemic heart disease and other diseases of the circulatory system: Secondary | ICD-10-CM | POA: Diagnosis not present

## 2017-07-25 DIAGNOSIS — Z853 Personal history of malignant neoplasm of breast: Secondary | ICD-10-CM | POA: Insufficient documentation

## 2017-07-25 DIAGNOSIS — M1611 Unilateral primary osteoarthritis, right hip: Secondary | ICD-10-CM | POA: Insufficient documentation

## 2017-07-25 DIAGNOSIS — I2510 Atherosclerotic heart disease of native coronary artery without angina pectoris: Secondary | ICD-10-CM | POA: Insufficient documentation

## 2017-07-25 DIAGNOSIS — G8929 Other chronic pain: Secondary | ICD-10-CM | POA: Diagnosis not present

## 2017-07-25 DIAGNOSIS — Z950 Presence of cardiac pacemaker: Secondary | ICD-10-CM | POA: Diagnosis not present

## 2017-07-25 DIAGNOSIS — I10 Essential (primary) hypertension: Secondary | ICD-10-CM | POA: Insufficient documentation

## 2017-07-25 DIAGNOSIS — Z9889 Other specified postprocedural states: Secondary | ICD-10-CM | POA: Diagnosis not present

## 2017-07-25 DIAGNOSIS — I480 Paroxysmal atrial fibrillation: Secondary | ICD-10-CM | POA: Insufficient documentation

## 2017-07-25 DIAGNOSIS — Z8052 Family history of malignant neoplasm of bladder: Secondary | ICD-10-CM | POA: Diagnosis not present

## 2017-07-25 NOTE — Progress Notes (Signed)
Subjective:    Patient ID: [name], female    DOB: 28-Apr-1923, 84 y.o.   MRN: [id number]  HPI Taking tramadol no longer on T3 or methocarbamol Admitted to [facility] for hyponatremia. Was admitted to SNF after acute hospital , was walking freq with walker in the SNF however patient has become less active since transfer to assisted living. At assistive living uses walker only in room, uses wheelchair to go to the dining room.  Assisted living helps with medications.   Pain Inventory Average Pain 7 Pain Right Now 7 My pain is dull  In the last 24 hours, has pain interfered with the following? General activity 7 Relation with others 5 Enjoyment of life 5 What TIME of day is your pain at its worst? evening Sleep (in general) Good  Pain is worse with: walking, sitting, inactivity and standing Pain improves with: medication and injections Relief from Meds: 9  Mobility walk with assistance use a walker ability to climb steps?  no do you drive?  no use a wheelchair  Function retired  Neuro/Psych trouble walking anxiety  Prior Studies Any changes since last visit?  no  Physicians involved in your care Any changes since last visit?  no   Family History  Problem Relation Age of Onset  . Cancer Mother        Bladder cancer  . Heart failure Father   . Heart attack Sister    Social History   Socioeconomic History  . Marital status: Married    Spouse name: Not on file  . Number of children: Not on file  . Years of education: Not on file  . Highest education level: Not on file  Social Needs  . Financial resource strain: Not on file  . Food insecurity - worry: Not on file  . Food insecurity - inability: Not on file  . Transportation needs - medical: Not on file  . Transportation needs - non-medical: Not on file  Occupational History  . Not on file  Tobacco Use  . Smoking status: Never Smoker  . Smokeless tobacco: Never Used  Substance and Sexual Activity    . Alcohol use: No  . Drug use: No  . Sexual activity: Not Currently    Birth control/protection: Post-menopausal  Other Topics Concern  . Not on file  Social History Narrative  . Not on file   Past Surgical History:  Procedure Laterality Date  . EYE SURGERY    . HERNIA REPAIR    . INSERT / REPLACE / REMOVE PACEMAKER  08/30/10   DUAL CHAMBER/ST. JUDE  . LEFT AND RIGHT HEART CATHETERIZATION WITH CORONARY ANGIOGRAM N/A 08/09/2014   Procedure: LEFT AND RIGHT HEART CATHETERIZATION WITH CORONARY ANGIOGRAM;  Surgeon: Burnell Blanks, MD;  Location: [facility] CATH LAB;  Service: Cardiovascular;  Laterality: N/A;  . PACEMAKER INSERTION    . PILONIDAL CYST EXCISION     Past Medical History:  Diagnosis Date  . Advanced age   . Atrial flutter ([place])   . Chronic pain    a. followed in pain mgmt.  . Complete heart block ([place])    Pacer  . 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 Dyslipidemia   . HTN (hypertension)   . Macular degeneration   . Mild CAD    a. LHC 08/2014: 40% D1, 30% LM.  . OA (osteoarthritis)   . PAF (paroxysmal atrial fibrillation) ([place])   . RBBB (right bundle branch block)   .  Severe aortic stenosis    a. pt has declined TAVR.   There were no vitals taken for this visit.  Opioid Risk Score:   Fall Risk Score:  `1  Depression screen PHQ 2/9  Depression screen [facility] 2/9 02/10/2015 09/15/2014  Decreased Interest 0 0  Down, Depressed, Hopeless 0 0  PHQ - 2 Score 0 0  Altered sleeping - 0  Tired, decreased energy - 1  Change in appetite - 0  Feeling bad or failure about yourself  - 0  Trouble concentrating - 0  Moving slowly or fidgety/restless - 0  Suicidal thoughts - 0  PHQ-9 Score - 1     Review of Systems  Constitutional: Positive for unexpected weight change.  HENT: Negative.   Eyes: Negative.   Respiratory: Negative.   Cardiovascular: Negative.   Gastrointestinal: Negative.   Endocrine: Negative.   Genitourinary: Negative.    Musculoskeletal: Negative.   Skin: Negative.   Allergic/Immunologic: Negative.   Neurological: Negative.   Hematological: Negative.   Psychiatric/Behavioral: Negative.   All other systems reviewed and are negative.      Objective:   Physical Exam  Constitutional: She is oriented to person, place, and time. She appears well-developed and well-nourished.  HENT:  Head: Normocephalic and atraumatic.  Eyes: Pupils are equal, round, and reactive to light.  Musculoskeletal:  Minimal right hip internal rotation and external rotation.  Pain with attempted range of motion of the right hip.  Limited active flexion as well as passive.  Good knee range of motion.  Pain to palpation along the lumbar spine.  Tenderness over the right greater trochanter of the hip.  Neurological: She is alert and oriented to person, place, and time. Gait abnormal.  Motor strength is 4/5 right knee extension, 3- hip flexion limited by range of motion and pain, 4 at the ankle dorsiflexor on the right 5 at the left hip flexor knee extensor ankle dorsiflexor.  Psychiatric: She has a normal mood and affect.  Nursing note and vitals reviewed.         Assessment & Plan:  #1.  Chronic right hip pain she does have hip osteoarthritis but also trochanteric bursitis.  She states she gets good relief with each trochanteric bursa injection has not had one for about 6 months.  We discussed the risk of repeated cortisone injections she understands.  Daughter is in the room with [place].  They would like to proceed with repeat injection under ultrasound guidance in 1 month.  2.  Chronic low back pain has lumbar spondylosis, and spondylolisthesis due to degenerative changes at L5-S1 no evidence of radiculopathy.  Cont tramadol 50 mg 3 times daily Continue Lyrica 75 twice daily Discontinue Robaxin

## 2017-07-31 ENCOUNTER — Telehealth: Payer: Self-pay | Admitting: *Deleted

## 2017-07-31 NOTE — Telephone Encounter (Signed)
Physical therapist from [facility] asked for verbal orders, 2week3 followed by 1week2.  Patient chart reviewed. Verbal orders given per office protocol

## 2017-08-12 ENCOUNTER — Ambulatory Visit: Payer: Federal, State, Local not specified - PPO | Admitting: Cardiovascular Disease

## 2017-08-20 ENCOUNTER — Telehealth: Payer: Self-pay

## 2017-08-20 NOTE — Telephone Encounter (Signed)
Deidra PT [facility] called today, requesting extention of orders for 1xwk X 2wks beginning after March 3, called her back and approved verbal orders.

## 2017-08-22 ENCOUNTER — Encounter: Payer: Federal, State, Local not specified - PPO | Attending: Physical Medicine & Rehabilitation

## 2017-08-22 ENCOUNTER — Ambulatory Visit (HOSPITAL_BASED_OUTPATIENT_CLINIC_OR_DEPARTMENT_OTHER): Payer: Federal, State, Local not specified - PPO | Admitting: Physical Medicine & Rehabilitation

## 2017-08-22 ENCOUNTER — Encounter: Payer: Self-pay | Admitting: Physical Medicine & Rehabilitation

## 2017-08-22 VITALS — BP 151/78 | HR 68

## 2017-08-22 DIAGNOSIS — I2510 Atherosclerotic heart disease of native coronary artery without angina pectoris: Secondary | ICD-10-CM | POA: Insufficient documentation

## 2017-08-22 DIAGNOSIS — G8929 Other chronic pain: Secondary | ICD-10-CM | POA: Diagnosis not present

## 2017-08-22 DIAGNOSIS — M810 Age-related osteoporosis without current pathological fracture: Secondary | ICD-10-CM | POA: Insufficient documentation

## 2017-08-22 DIAGNOSIS — Z8249 Family history of ischemic heart disease and other diseases of the circulatory system: Secondary | ICD-10-CM | POA: Diagnosis not present

## 2017-08-22 DIAGNOSIS — Z955 Presence of coronary angioplasty implant and graft: Secondary | ICD-10-CM | POA: Diagnosis not present

## 2017-08-22 DIAGNOSIS — Z853 Personal history of malignant neoplasm of breast: Secondary | ICD-10-CM | POA: Insufficient documentation

## 2017-08-22 DIAGNOSIS — Z950 Presence of cardiac pacemaker: Secondary | ICD-10-CM | POA: Insufficient documentation

## 2017-08-22 DIAGNOSIS — M48061 Spinal stenosis, lumbar region without neurogenic claudication: Secondary | ICD-10-CM | POA: Diagnosis not present

## 2017-08-22 DIAGNOSIS — I480 Paroxysmal atrial fibrillation: Secondary | ICD-10-CM | POA: Diagnosis not present

## 2017-08-22 DIAGNOSIS — Z9889 Other specified postprocedural states: Secondary | ICD-10-CM | POA: Diagnosis not present

## 2017-08-22 DIAGNOSIS — Z8052 Family history of malignant neoplasm of bladder: Secondary | ICD-10-CM | POA: Diagnosis not present

## 2017-08-22 DIAGNOSIS — E785 Hyperlipidemia, unspecified: Secondary | ICD-10-CM | POA: Insufficient documentation

## 2017-08-22 DIAGNOSIS — M1611 Unilateral primary osteoarthritis, right hip: Secondary | ICD-10-CM | POA: Insufficient documentation

## 2017-08-22 DIAGNOSIS — I10 Essential (primary) hypertension: Secondary | ICD-10-CM | POA: Insufficient documentation

## 2017-08-22 DIAGNOSIS — M7061 Trochanteric bursitis, right hip: Secondary | ICD-10-CM | POA: Diagnosis not present

## 2017-08-22 DIAGNOSIS — Z79899 Other long term (current) drug therapy: Secondary | ICD-10-CM | POA: Diagnosis not present

## 2017-08-22 NOTE — Patient Instructions (Signed)
We did the bursa injection of the Right hip today, we may consider hip joint injection depending on her response . Will do MD follow up visit in 6 wks to check response

## 2017-08-22 NOTE — Progress Notes (Signed)
Right Trochanteric bursa injection With ultrasound guidance  Indication Trochanteric bursitis. Exam has tenderness over the greater trochanter of the hip. Pain has not responded to conservative care such as exercise therapy and oral medications. Pain interferes with sleep or with mobility Informed consent was obtained after describing risks and benefits of the procedure with the patient these include bleeding bruising and infection. Patient has signed written consent form. Patient placed in a lateral decubitus position with the affected hip superior. Point of maximal pain was palpated marked and prepped with Betadine and entered with a needle to bone contact. Needle slightly withdrawn then 6mg  of betamethasone with 4 cc 1% lidocaine were injected. Patient tolerated procedure well. Post procedure instructions given.

## 2017-09-02 ENCOUNTER — Inpatient Hospital Stay (HOSPITAL_BASED_OUTPATIENT_CLINIC_OR_DEPARTMENT_OTHER)
Admission: EM | Admit: 2017-09-02 | Discharge: 2017-09-04 | DRG: 603 | Disposition: A | Discharge status: Home / Self Care | Payer: Medicare Other | Attending: Family Medicine | Admitting: Family Medicine

## 2017-09-02 ENCOUNTER — Other Ambulatory Visit: Payer: Self-pay

## 2017-09-02 ENCOUNTER — Emergency Department (HOSPITAL_BASED_OUTPATIENT_CLINIC_OR_DEPARTMENT_OTHER): Payer: Medicare Other

## 2017-09-02 ENCOUNTER — Encounter (HOSPITAL_BASED_OUTPATIENT_CLINIC_OR_DEPARTMENT_OTHER): Payer: Self-pay

## 2017-09-02 DIAGNOSIS — I4892 Unspecified atrial flutter: Secondary | ICD-10-CM | POA: Diagnosis present

## 2017-09-02 DIAGNOSIS — I350 Nonrheumatic aortic (valve) stenosis: Secondary | ICD-10-CM | POA: Diagnosis present

## 2017-09-02 DIAGNOSIS — L03115 Cellulitis of right lower limb: Secondary | ICD-10-CM | POA: Diagnosis not present

## 2017-09-02 DIAGNOSIS — Z7901 Long term (current) use of anticoagulants: Secondary | ICD-10-CM

## 2017-09-02 DIAGNOSIS — Z888 Allergy status to other drugs, medicaments and biological substances status: Secondary | ICD-10-CM | POA: Diagnosis not present

## 2017-09-02 DIAGNOSIS — Z881 Allergy status to other antibiotic agents status: Secondary | ICD-10-CM

## 2017-09-02 DIAGNOSIS — G8929 Other chronic pain: Secondary | ICD-10-CM | POA: Diagnosis present

## 2017-09-02 DIAGNOSIS — Z8249 Family history of ischemic heart disease and other diseases of the circulatory system: Secondary | ICD-10-CM

## 2017-09-02 DIAGNOSIS — Z7401 Bed confinement status: Secondary | ICD-10-CM

## 2017-09-02 DIAGNOSIS — E782 Mixed hyperlipidemia: Secondary | ICD-10-CM | POA: Diagnosis present

## 2017-09-02 DIAGNOSIS — M48061 Spinal stenosis, lumbar region without neurogenic claudication: Secondary | ICD-10-CM | POA: Diagnosis present

## 2017-09-02 DIAGNOSIS — L03119 Cellulitis of unspecified part of limb: Secondary | ICD-10-CM

## 2017-09-02 DIAGNOSIS — Z853 Personal history of malignant neoplasm of breast: Secondary | ICD-10-CM

## 2017-09-02 DIAGNOSIS — F0390 Unspecified dementia without behavioral disturbance: Secondary | ICD-10-CM | POA: Diagnosis present

## 2017-09-02 DIAGNOSIS — I10 Essential (primary) hypertension: Secondary | ICD-10-CM | POA: Diagnosis present

## 2017-09-02 DIAGNOSIS — Z993 Dependence on wheelchair: Secondary | ICD-10-CM | POA: Diagnosis not present

## 2017-09-02 DIAGNOSIS — R7989 Other specified abnormal findings of blood chemistry: Secondary | ICD-10-CM | POA: Diagnosis present

## 2017-09-02 DIAGNOSIS — I480 Paroxysmal atrial fibrillation: Secondary | ICD-10-CM | POA: Diagnosis present

## 2017-09-02 DIAGNOSIS — N179 Acute kidney failure, unspecified: Secondary | ICD-10-CM | POA: Diagnosis present

## 2017-09-02 DIAGNOSIS — L0390 Cellulitis, unspecified: Secondary | ICD-10-CM | POA: Diagnosis present

## 2017-09-02 DIAGNOSIS — I2510 Atherosclerotic heart disease of native coronary artery without angina pectoris: Secondary | ICD-10-CM | POA: Diagnosis present

## 2017-09-02 DIAGNOSIS — F419 Anxiety disorder, unspecified: Secondary | ICD-10-CM | POA: Diagnosis present

## 2017-09-02 DIAGNOSIS — Z8052 Family history of malignant neoplasm of bladder: Secondary | ICD-10-CM

## 2017-09-02 DIAGNOSIS — N289 Disorder of kidney and ureter, unspecified: Secondary | ICD-10-CM

## 2017-09-02 DIAGNOSIS — Z880 Allergy status to penicillin: Secondary | ICD-10-CM | POA: Diagnosis not present

## 2017-09-02 DIAGNOSIS — Z950 Presence of cardiac pacemaker: Secondary | ICD-10-CM | POA: Diagnosis present

## 2017-09-02 DIAGNOSIS — I4891 Unspecified atrial fibrillation: Secondary | ICD-10-CM | POA: Diagnosis present

## 2017-09-02 DIAGNOSIS — I442 Atrioventricular block, complete: Secondary | ICD-10-CM | POA: Diagnosis present

## 2017-09-02 LAB — CBC WITH DIFFERENTIAL/PLATELET
Basophils Absolute: 0 10*3/uL (ref 0.0–0.1)
Basophils Relative: 0 %
EOS ABS: 0.1 10*3/uL (ref 0.0–0.7)
Eosinophils Relative: 1 %
HCT: 42.8 % (ref 36.0–46.0)
HEMOGLOBIN: 14.1 g/dL (ref 12.0–15.0)
LYMPHS ABS: 1.2 10*3/uL (ref 0.7–4.0)
Lymphocytes Relative: 11 %
MCH: 27.5 pg (ref 26.0–34.0)
MCHC: 32.9 g/dL (ref 30.0–36.0)
MCV: 83.6 fL (ref 78.0–100.0)
Monocytes Absolute: 1 10*3/uL (ref 0.1–1.0)
Monocytes Relative: 10 %
NEUTROS PCT: 78 %
Neutro Abs: 8.2 10*3/uL — ABNORMAL HIGH (ref 1.7–7.7)
PLATELETS: 231 10*3/uL (ref 150–400)
RBC: 5.12 MIL/uL — ABNORMAL HIGH (ref 3.87–5.11)
RDW: 16.8 % — ABNORMAL HIGH (ref 11.5–15.5)
WBC: 10.5 10*3/uL (ref 4.0–10.5)

## 2017-09-02 LAB — PROTIME-INR
INR: 1.34
PROTHROMBIN TIME: 16.5 s — AB (ref 11.4–15.2)

## 2017-09-02 LAB — COMPREHENSIVE METABOLIC PANEL
ALT: 103 U/L — AB (ref 14–54)
AST: 90 U/L — ABNORMAL HIGH (ref 15–41)
Albumin: 3.3 g/dL — ABNORMAL LOW (ref 3.5–5.0)
Alkaline Phosphatase: 130 U/L — ABNORMAL HIGH (ref 38–126)
Anion gap: 8 (ref 5–15)
BILIRUBIN TOTAL: 0.8 mg/dL (ref 0.3–1.2)
BUN: 43 mg/dL — ABNORMAL HIGH (ref 6–20)
CHLORIDE: 100 mmol/L — AB (ref 101–111)
CO2: 26 mmol/L (ref 22–32)
CREATININE: 1.18 mg/dL — AB (ref 0.44–1.00)
Calcium: 8.4 mg/dL — ABNORMAL LOW (ref 8.9–10.3)
GFR, EST AFRICAN AMERICAN: 45 mL/min — AB (ref >60)
GFR, EST NON AFRICAN AMERICAN: 39 mL/min — AB (ref >60)
Glucose, Bld: 127 mg/dL — ABNORMAL HIGH (ref 65–99)
POTASSIUM: 3.9 mmol/L (ref 3.5–5.1)
Sodium: 134 mmol/L — ABNORMAL LOW (ref 135–145)
TOTAL PROTEIN: 5.3 g/dL — AB (ref 6.5–8.1)

## 2017-09-02 LAB — I-STAT CG4 LACTIC ACID, ED
LACTIC ACID, VENOUS: 1.89 mmol/L (ref 0.5–1.9)
Lactic Acid, Venous: 3.54 mmol/L (ref 0.5–1.9)

## 2017-09-02 MED ORDER — TRAMADOL HCL 50 MG PO TABS
50.0000 mg | ORAL_TABLET | Freq: Once | ORAL | Status: AC
  Administered 2017-09-02: 50 mg via ORAL
  Filled 2017-09-02: qty 1

## 2017-09-02 MED ORDER — ALPRAZOLAM 0.25 MG PO TABS
0.2500 mg | ORAL_TABLET | Freq: Three times a day (TID) | ORAL | Status: DC | PRN
  Administered 2017-09-03: 0.25 mg via ORAL
  Filled 2017-09-02: qty 1

## 2017-09-02 MED ORDER — SODIUM CHLORIDE 0.9 % IV BOLUS (SEPSIS)
500.0000 mL | Freq: Once | INTRAVENOUS | Status: AC
  Administered 2017-09-02: 500 mL via INTRAVENOUS

## 2017-09-02 MED ORDER — SODIUM CHLORIDE 0.9 % IV SOLN
Freq: Once | INTRAVENOUS | Status: AC
  Administered 2017-09-02: 23:00:00 via INTRAVENOUS

## 2017-09-02 MED ORDER — CLINDAMYCIN PHOSPHATE 600 MG/50ML IV SOLN
600.0000 mg | Freq: Once | INTRAVENOUS | Status: AC
  Administered 2017-09-02: 600 mg via INTRAVENOUS
  Filled 2017-09-02: qty 50

## 2017-09-02 NOTE — ED Triage Notes (Signed)
Per daughter pt with bilat LE swelling ans an "oozing spot" to right LE x today-presents to triage in w/c-NAD-denies pain

## 2017-09-02 NOTE — ED Notes (Signed)
Consulted with EDP about providing pt with IVF for elevated lactic and EDP wants to review results first.

## 2017-09-02 NOTE — ED Provider Notes (Signed)
[facility] EMERGENCY DEPARTMENT Provider Note   CSN: [id number] Arrival date & time: 09/02/17  1928     History   Chief Complaint Chief Complaint  Patient presents with  . Leg Swelling    HPI Erin Charles is a 84 y.o. female.  HPI Patient lives at assisted living with her husband.  She has been pursuing usual activities.  She typically uses a wheelchair for mobility.  Daughter reports this is been the case now for several months.  Her daughter was visiting and took off her shoes and socks to help her change into her pajamas.  She noticed that she took off the right sock that was very wet and she thought maybe she had spilled something.  The foot was very red and swollen with an oozing spot.  Ports the other foot is red and swollen as well but not so much as the right.  The patient denies she has any active pain.  Patient denies chest pain or shortness of breath.  She does have known aortic stenosis.  No fevers have been documented or reported.  Patient's daughter denies that the patient has had problems with foot wounds or infections in the past. Past Medical History:  Diagnosis Date  . Advanced age   . Atrial flutter ([place])   . Chronic pain    a. followed in pain mgmt.  . Complete heart block ([place])    Pacer  . 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 Dyslipidemia   . HTN (hypertension)   . Macular degeneration   . Mild CAD    a. LHC 08/2014: 40% D1, 30% LM.  . OA (osteoarthritis)   . PAF (paroxysmal atrial fibrillation) ([place])   . RBBB (right bundle branch block)   . Severe aortic stenosis    a. pt has declined TAVR.    Patient Active Problem List   Diagnosis Date Noted  . Cellulitis 09/02/2017  . Urinary retention   . Physical deconditioning   . Dementia without behavioral disturbance   . Anxiety   . Hypokalemia   . Hyponatremia 04/28/2017  . Back pain 04/28/2017  . Elevated troponin 04/28/2017  . Mixed hyperlipidemia 08/13/2016    . Ductal carcinoma in situ (DCIS) of left breast 07/21/2015  . Traumatic ecchymosis of groin 08/18/2014  . Trochanteric bursitis of right hip 03/03/2014  . Osteoarthritis of hip 06/07/2013  . DJD (degenerative joint disease) of knee 06/07/2013  . Encounter for long-term (current) use of other medications 06/07/2013  . Spinal stenosis, lumbar region, without neurogenic claudication 06/07/2013  . Atrial fibrillation ([place]) 03/30/2013  . Atrioventricular block, complete ([place]) 12/12/2011  . Unstable angina ([place]) 08/04/2011  . Hypertension 09/27/2010  . Pacemaker-St.Jude 09/27/2010  . Aortic stenosis 09/27/2010    Past Surgical History:  Procedure Laterality Date  . EYE SURGERY    . HERNIA REPAIR    . INSERT / REPLACE / REMOVE PACEMAKER  08/30/10   DUAL CHAMBER/ST. JUDE  . LEFT AND RIGHT HEART CATHETERIZATION WITH CORONARY ANGIOGRAM N/A 08/09/2014   Procedure: LEFT AND RIGHT HEART CATHETERIZATION WITH CORONARY ANGIOGRAM;  Surgeon: Burnell Blanks, MD;  Location: [facility] CATH LAB;  Service: Cardiovascular;  Laterality: N/A;  . PACEMAKER INSERTION    . PILONIDAL CYST EXCISION      OB History    No data available       Home Medications    Prior to Admission medications   Medication Sig Start Date End Date  Taking? Authorizing Provider  acetaminophen (TYLENOL) 325 MG tablet Take 650 mg by mouth every 6 (six) hours as needed.    [provider]  ALPRAZolam Duanne Moron) 0.25 MG tablet Take 1 tablet (0.25 mg total) by mouth every 8 (eight) hours as needed for anxiety. 05/02/17   Barton Dubois, MD  anastrozole (ARIMIDEX) 1 MG tablet Take 1 tablet (1 mg total) daily by mouth. 05/13/17   Truitt Merle, MD  ELIQUIS 2.5 MG TABS tablet TAKE 1 TABLET BY MOUTH TWICE A DAY 02/24/17   Nahser, Wonda Cheng, MD  hydrochlorothiazide (HYDRODIURIL) 25 MG tablet Take 25 mg by mouth daily.    [provider]  metoprolol succinate (TOPROL-XL) 50 MG 24 hr tablet TAKE 1 TABLET BY MOUTH DAILY 01/20/17    Nahser, Wonda Cheng, MD  NITROSTAT 0.4 MG SL tablet PLACE 1 TABLET (0.4 MG TOTAL) UNDER THE TONGUE EVERY 5 (FIVE) MINUTES AS NEEDED FOR CHEST PAIN. 04/29/16   Nahser, Wonda Cheng, MD  potassium chloride (K-DUR,KLOR-CON) 10 MEQ tablet Take 10 mEq by mouth 2 (two) times daily.    [provider]  quinapril (ACCUPRIL) 40 MG tablet Take 40 mg by mouth daily.     [provider]  simvastatin (ZOCOR) 40 MG tablet Take 40 mg by mouth at bedtime.     Nahser, Wonda Cheng, MD  traMADol (ULTRAM) 50 MG tablet Take 1 tablet (50 mg total) by mouth every 8 (eight) hours as needed for moderate pain or severe pain (not relieved by tylenol #3). 05/02/17   Barton Dubois, MD  zolpidem (AMBIEN) 10 MG tablet Take 10 mg by mouth at bedtime. 09/15/13   [provider]    Family History Family History  Problem Relation Age of Onset  . Cancer Mother        Bladder cancer  . Heart failure Father   . Heart attack Sister     Social History Social History   Tobacco Use  . Smoking status: Never Smoker  . Smokeless tobacco: Never Used  Substance Use Topics  . Alcohol use: No  . Drug use: No     Allergies   Macrodantin; Penicillins; and Tolectin [tolmetin sodium]   Review of Systems Review of Systems 10 Systems reviewed and are negative for acute change except as noted in the HPI.   Physical Exam Updated Vital Signs BP (!) 141/79   Pulse 70   Temp 97.6 F (36.4 C) (Oral)   Resp (!) 25   Wt 48.1 kg (106 lb)   SpO2 100%   BMI 21.41 kg/m   Physical Exam  Constitutional:  Patient is alert and interactive.  She is in no acute distress.  Color is good.  No respiratory distress.  HENT:  Head: Normocephalic and atraumatic.  Mouth/Throat: Oropharynx is clear and moist.  Eyes: EOM are normal.  Cardiovascular: Normal rate.  Heart is irregularly irregular.  3\6 systolic ejection murmur.  Pulmonary/Chest: Effort normal and breath sounds normal.  Abdominal: Soft. She exhibits no  distension. There is no tenderness. There is no guarding.  Musculoskeletal: Normal range of motion.  Patient has erythema of both feet.  Right greater than left.  Pitting edema bilateral feet.  Serous drainage from very small wound on right lower leg.  2+ distal pulses bilaterally.  See attached images.  Neurological: She is alert.  Patient is alert and cheerful.  She is hard of hearing.  She does have some dementia.  But she interacts appropriately and answers questions.  Movements  are coordinated and symmetric.  Skin: Skin is warm and dry.  Psychiatric: She has a normal mood and affect.           ED Treatments / Results  Labs (all labs ordered are listed, but only abnormal results are displayed) Labs Reviewed  COMPREHENSIVE METABOLIC PANEL - Abnormal; Notable for the following components:      Result Value   Sodium 134 (*)    Chloride 100 (*)    Glucose, Bld 127 (*)    BUN 43 (*)    Creatinine, Ser 1.18 (*)    Calcium 8.4 (*)    Total Protein 5.3 (*)    Albumin 3.3 (*)    AST 90 (*)    ALT 103 (*)    Alkaline Phosphatase 130 (*)    GFR calc non Af Amer 39 (*)    GFR calc Af Amer 45 (*)    All other components within normal limits  CBC WITH DIFFERENTIAL/PLATELET - Abnormal; Notable for the following components:   RBC 5.12 (*)    RDW 16.8 (*)    Neutro Abs 8.2 (*)    All other components within normal limits  PROTIME-INR - Abnormal; Notable for the following components:   Prothrombin Time 16.5 (*)    All other components within normal limits  I-STAT CG4 LACTIC ACID, ED - Abnormal; Notable for the following components:   Lactic Acid, Venous 3.54 (*)    All other components within normal limits  CULTURE, BLOOD (ROUTINE X 2)  CULTURE, BLOOD (ROUTINE X 2)  URINALYSIS, ROUTINE W REFLEX MICROSCOPIC  I-STAT CG4 LACTIC ACID, ED    EKG  EKG Interpretation None       Radiology Dg Chest 2 View  Result Date: 09/02/2017 CLINICAL DATA:  Foot swelling EXAM: CHEST - 2  VIEW COMPARISON:  04/28/2017 FINDINGS: Left-sided pacing device as before. Small bilateral pleural effusions. Cardiomegaly with aortic atherosclerosis. No pneumothorax. Advanced degenerative changes of both shoulders. IMPRESSION: 1. Small bilateral pleural effusions 2. Cardiomegaly Electronically Signed   By: Donavan Foil M.D.   On: 09/02/2017 21:38   Dg Foot Complete Right  Result Date: 09/02/2017 CLINICAL DATA:  Right foot pain and swelling EXAM: RIGHT FOOT COMPLETE - 3+ VIEW COMPARISON:  None. FINDINGS: No acute displaced fracture is seen. Vascular calcifications. Hallux valgus deformity at the first MTP joint with degenerative changes. Arthritis at the second third and fifth PIP joints and the second through fifth DIP joints. Diffuse degenerative changes at the TMT joints. No bony destruction. No soft tissue gas. Small plantar calcaneal spur. IMPRESSION: 1. No definite acute osseous abnormality 2. Arthritis within the digits of the foot. Hallux valgus deformity at the first MTP joint Electronically Signed   By: Donavan Foil M.D.   On: 09/02/2017 21:43    Procedures Procedures (including critical care time)  Medications Ordered in ED Medications  clindamycin (CLEOCIN) IVPB 600 mg (0 mg Intravenous Stopped 09/02/17 2204)  sodium chloride 0.9 % bolus 500 mL (0 mLs Intravenous Stopped 09/02/17 2250)  0.9 %  sodium chloride infusion ( Intravenous New Bag/Given 09/02/17 2252)  traMADol (ULTRAM) tablet 50 mg (50 mg Oral Given 09/02/17 2251)     Initial Impression / Assessment and Plan / ED Course  I have reviewed the triage vital signs and the nursing notes.  Pertinent labs & imaging results that were available during my care of the patient were reviewed by me and considered in my medical decision making (see chart for details).  Consult: Dr. Hal Hope for admission.  Final Clinical Impressions(s) / ED Diagnoses   Final diagnoses:  Cellulitis of lower extremity, unspecified laterality    Acute renal insufficiency   Patient presents as outlined above.  Her daughter discovered significant redness and swelling of her feet right much greater than left.  At this time symptoms are consistent with cellulitis.  Patient also has acute renal insufficiency.  Mental status is clear.  Vital signs are stable.  Plan will be for admission for IV antibiotics. ED Discharge Orders    None       Charlesetta Shanks, MD 09/02/17 2348

## 2017-09-03 ENCOUNTER — Encounter (HOSPITAL_COMMUNITY): Payer: Self-pay

## 2017-09-03 ENCOUNTER — Other Ambulatory Visit: Payer: Self-pay

## 2017-09-03 DIAGNOSIS — L03119 Cellulitis of unspecified part of limb: Secondary | ICD-10-CM

## 2017-09-03 DIAGNOSIS — N289 Disorder of kidney and ureter, unspecified: Secondary | ICD-10-CM

## 2017-09-03 LAB — URINALYSIS, ROUTINE W REFLEX MICROSCOPIC
Bacteria, UA: NONE SEEN
Bilirubin Urine: NEGATIVE
Glucose, UA: NEGATIVE mg/dL
Hgb urine dipstick: NEGATIVE
Ketones, ur: NEGATIVE mg/dL
Leukocytes, UA: NEGATIVE
Nitrite: NEGATIVE
Protein, ur: 30 mg/dL — AB
Specific Gravity, Urine: 1.017 (ref 1.005–1.030)
pH: 5 (ref 5.0–8.0)

## 2017-09-03 LAB — MRSA PCR SCREENING: MRSA BY PCR: NEGATIVE

## 2017-09-03 MED ORDER — SODIUM CHLORIDE 0.9 % IV SOLN
Freq: Once | INTRAVENOUS | Status: AC
  Administered 2017-09-03: 01:00:00 via INTRAVENOUS

## 2017-09-03 MED ORDER — LISINOPRIL 20 MG PO TABS: 40.0000 mg | ORAL_TABLET | Freq: Every day | ORAL | Status: DC

## 2017-09-03 MED ORDER — ZOLPIDEM TARTRATE 5 MG PO TABS
5.0000 mg | ORAL_TABLET | Freq: Once | ORAL | Status: AC
  Administered 2017-09-03: 5 mg via ORAL
  Filled 2017-09-03: qty 1

## 2017-09-03 MED ORDER — ONDANSETRON HCL 4 MG/2ML IJ SOLN: 4.0000 mg | Freq: Four times a day (QID) | INTRAMUSCULAR | Status: DC | PRN

## 2017-09-03 MED ORDER — QUINAPRIL HCL 10 MG PO TABS
40.0000 mg | ORAL_TABLET | Freq: Every day | ORAL | Status: DC
  Administered 2017-09-03: 40 mg via ORAL
  Filled 2017-09-03 (×2): qty 4

## 2017-09-03 MED ORDER — ALPRAZOLAM 0.25 MG PO TABS
0.2500 mg | ORAL_TABLET | Freq: Two times a day (BID) | ORAL | Status: DC | PRN
  Administered 2017-09-04: 0.25 mg via ORAL
  Filled 2017-09-03 (×3): qty 1

## 2017-09-03 MED ORDER — SIMVASTATIN 40 MG PO TABS
40.0000 mg | ORAL_TABLET | Freq: Every day | ORAL | Status: DC
  Administered 2017-09-03 (×2): 40 mg via ORAL
  Filled 2017-09-03 (×5): qty 1

## 2017-09-03 MED ORDER — TRAMADOL HCL 50 MG PO TABS
50.0000 mg | ORAL_TABLET | Freq: Four times a day (QID) | ORAL | Status: DC | PRN
Start: 2017-09-03 — End: 2017-09-04
  Administered 2017-09-03 – 2017-09-04 (×3): 50 mg via ORAL
  Filled 2017-09-03 (×4): qty 1

## 2017-09-03 MED ORDER — ACETAMINOPHEN 650 MG RE SUPP: 650.0000 mg | Freq: Four times a day (QID) | RECTAL | Status: DC | PRN

## 2017-09-03 MED ORDER — ALPRAZOLAM 0.25 MG PO TABS: 0.2500 mg | ORAL_TABLET | Freq: Three times a day (TID) | ORAL | Status: DC | PRN

## 2017-09-03 MED ORDER — ZOLPIDEM TARTRATE 5 MG PO TABS
5.0000 mg | ORAL_TABLET | Freq: Every evening | ORAL | Status: DC | PRN
  Administered 2017-09-03: 5 mg via ORAL
  Filled 2017-09-03: qty 1

## 2017-09-03 MED ORDER — POTASSIUM CHLORIDE CRYS ER 10 MEQ PO TBCR: 10.0000 meq | EXTENDED_RELEASE_TABLET | Freq: Every day | ORAL | Status: DC

## 2017-09-03 MED ORDER — HYDROCHLOROTHIAZIDE 25 MG PO TABS
25.0000 mg | ORAL_TABLET | Freq: Every day | ORAL | Status: DC
  Administered 2017-09-03 – 2017-09-04 (×2): 25 mg via ORAL
  Filled 2017-09-03 (×2): qty 1

## 2017-09-03 MED ORDER — METOPROLOL SUCCINATE ER 50 MG PO TB24
50.0000 mg | ORAL_TABLET | Freq: Every day | ORAL | Status: DC
  Administered 2017-09-03 – 2017-09-04 (×2): 50 mg via ORAL
  Filled 2017-09-03 (×3): qty 1

## 2017-09-03 MED ORDER — TRAMADOL HCL 50 MG PO TABS
25.0000 mg | ORAL_TABLET | Freq: Four times a day (QID) | ORAL | Status: DC | PRN
  Administered 2017-09-04: 25 mg via ORAL
  Filled 2017-09-03: qty 1

## 2017-09-03 MED ORDER — POTASSIUM CHLORIDE CRYS ER 10 MEQ PO TBCR
10.0000 meq | EXTENDED_RELEASE_TABLET | Freq: Two times a day (BID) | ORAL | Status: DC
  Administered 2017-09-03: 10 meq via ORAL

## 2017-09-03 MED ORDER — ACETAMINOPHEN 325 MG PO TABS: 650.0000 mg | ORAL_TABLET | Freq: Four times a day (QID) | ORAL | Status: DC | PRN

## 2017-09-03 MED ORDER — ACETAMINOPHEN 325 MG PO TABS
650.0000 mg | ORAL_TABLET | Freq: Three times a day (TID) | ORAL | Status: DC | PRN
  Administered 2017-09-04: 650 mg via ORAL
  Filled 2017-09-03: qty 2

## 2017-09-03 MED ORDER — ZOLPIDEM TARTRATE 5 MG PO TABS
5.0000 mg | ORAL_TABLET | Freq: Every day | ORAL | Status: DC
  Filled 2017-09-03 (×2): qty 2

## 2017-09-03 MED ORDER — ANASTROZOLE 1 MG PO TABS
1.0000 mg | ORAL_TABLET | Freq: Every day | ORAL | Status: DC
  Administered 2017-09-03 – 2017-09-04 (×2): 1 mg via ORAL
  Filled 2017-09-03 (×3): qty 1

## 2017-09-03 MED ORDER — ONDANSETRON HCL 4 MG PO TABS: 4.0000 mg | ORAL_TABLET | Freq: Four times a day (QID) | ORAL | Status: DC | PRN

## 2017-09-03 MED ORDER — POLYETHYLENE GLYCOL 3350 17 G PO PACK: 17.0000 g | PACK | Freq: Every day | ORAL | Status: DC | PRN

## 2017-09-03 MED ORDER — DIPHENHYDRAMINE HCL 25 MG PO CAPS
ORAL_CAPSULE | ORAL | Status: AC
  Filled 2017-09-03: qty 1

## 2017-09-03 MED ORDER — POTASSIUM CHLORIDE CRYS ER 10 MEQ PO TBCR
10.0000 meq | EXTENDED_RELEASE_TABLET | Freq: Every day | ORAL | Status: DC
  Administered 2017-09-03 – 2017-09-04 (×2): 10 meq via ORAL
  Filled 2017-09-03 (×2): qty 1

## 2017-09-03 MED ORDER — POTASSIUM CHLORIDE CRYS ER 20 MEQ PO TBCR
EXTENDED_RELEASE_TABLET | ORAL | Status: AC
  Filled 2017-09-03: qty 1

## 2017-09-03 MED ORDER — ZOLPIDEM TARTRATE 10 MG PO TABS
10.0000 mg | ORAL_TABLET | Freq: Every day | ORAL | Status: DC
  Filled 2017-09-03: qty 1

## 2017-09-03 MED ORDER — TRAMADOL HCL 50 MG PO TABS
50.0000 mg | ORAL_TABLET | Freq: Three times a day (TID) | ORAL | Status: DC | PRN
  Administered 2017-09-03: 50 mg via ORAL

## 2017-09-03 MED ORDER — SODIUM CHLORIDE 0.9 % IV SOLN
INTRAVENOUS | Status: DC
  Administered 2017-09-03 – 2017-09-04 (×2): via INTRAVENOUS

## 2017-09-03 MED ORDER — DIPHENHYDRAMINE HCL 25 MG PO CAPS
25.0000 mg | ORAL_CAPSULE | Freq: Once | ORAL | Status: AC
  Administered 2017-09-03: 25 mg via ORAL

## 2017-09-03 MED ORDER — ZOLPIDEM TARTRATE 5 MG PO TABS: 5.0000 mg | ORAL_TABLET | Freq: Every day | ORAL | Status: DC

## 2017-09-03 MED ORDER — CLINDAMYCIN PHOSPHATE 600 MG/50ML IV SOLN
600.0000 mg | Freq: Three times a day (TID) | INTRAVENOUS | Status: DC
  Administered 2017-09-03 – 2017-09-04 (×5): 600 mg via INTRAVENOUS
  Filled 2017-09-03 (×5): qty 50

## 2017-09-03 MED ORDER — APIXABAN 2.5 MG PO TABS
2.5000 mg | ORAL_TABLET | Freq: Two times a day (BID) | ORAL | Status: DC
  Administered 2017-09-03 – 2017-09-04 (×4): 2.5 mg via ORAL
  Filled 2017-09-03 (×6): qty 1

## 2017-09-03 NOTE — H&P (Signed)
History and Physical    Erin Charles WCH:[id number] DOB: [birth year] DOA: 09/02/2017  PCP: Hulan Fess, MD (  Patient coming from: Assisted living facility    Chief Complaint: Right lower extremity cellulitis  HPI: Erin Charles is a 84 y.o. female with medical history significant of severe aortic stenosis valve area mean of 0.51 cm on echo on 04/29/2017, paroxysmal atrial fibrillation/flutter complicated by complete heart block status post pacer placement on anticoagulation, dementia, hypertension, anxiety, hyperlipidemia who was brought in from assisted living facility with right lower extremity cellulitis.  Patient has dementia and is a very poor historian.  She apparently is chronically bedbound and uses a wheelchair most of the time.  She rarely walks.  Her daughter came to see her yesterday and noted that her sock was wet when she went to change her shoes.  Removing the socks she noted that there was a purulent area with some significant drainage and surrounding erythema and edema.  Patient herself denies any fevers pain, chills however the daughter reported the patient "did not look good".  Daughter took patient to [facility] ER.   ED Course: In the ED patient's vitals were fairly unremarkable.  She was given IV clindamycin.  Labs were notable for a normal white count.  Patient was noted to have mildly elevated creatinine of 1.18 with elevated BUN.  Patient was also noted to have mildly elevated AST and ALT and alkaline phosphatase.  Patient was transferred here for management of cellulitis in the setting of AK I.  Review of Systems: As per HPI otherwise 10 point review of systems negative.  Unacceptable ROS statements: "10 systems reviewed," "Extensive" (without elaboration).  Acceptable ROS statements: "All others negative," "All others reviewed and are negative," and "All others unremarkable," with at [place] documented Can't double dip - if using for HPI can't  use for ROS  Past Medical History:  Diagnosis Date  . Advanced age   . Atrial flutter ([place])   . Chronic pain    a. followed in pain mgmt.  . Complete heart block ([place])    Pacer  . 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 Dyslipidemia   . HTN (hypertension)   . Macular degeneration   . Mild CAD    a. LHC 08/2014: 40% D1, 30% LM.  . OA (osteoarthritis)   . PAF (paroxysmal atrial fibrillation) ([place])   . RBBB (right bundle branch block)   . Severe aortic stenosis    a. pt has declined TAVR.    Past Surgical History:  Procedure Laterality Date  . EYE SURGERY    . HERNIA REPAIR    . INSERT / REPLACE / REMOVE PACEMAKER  08/30/10   DUAL CHAMBER/ST. JUDE  . LEFT AND RIGHT HEART CATHETERIZATION WITH CORONARY ANGIOGRAM N/A 08/09/2014   Procedure: LEFT AND RIGHT HEART CATHETERIZATION WITH CORONARY ANGIOGRAM;  Surgeon: Burnell Blanks, MD;  Location: [facility] CATH LAB;  Service: Cardiovascular;  Laterality: N/A;  . PACEMAKER INSERTION    . PILONIDAL CYST EXCISION       reports that  has never smoked. she has never used smokeless tobacco. She reports that she does not drink alcohol or use drugs.  Allergies  Allergen Reactions  . Macrodantin Rash  . Penicillins Rash and Other (See Comments)    Many years ago. Has patient had a PCN reaction causing immediate rash, facial/tongue/throat swelling, SOB or lightheadedness with hypotension: unknown Has patient had a PCN reaction  causing severe rash involving mucus membranes or skin necrosis: unknown Has patient had a PCN reaction that required hospitalization: unknown Has patient had a PCN reaction occurring within the last 10 years: No If all of the above answers are "NO", then may proceed with Cephalosporin use.   Marland Kitchen Tolectin [Tolmetin Sodium] Rash    Family History  Problem Relation Age of Onset  . Cancer Mother        Bladder cancer  . Heart failure Father   . Heart attack Sister    Unacceptable:  Noncontributory, unremarkable, or negative. Acceptable: Family history reviewed and not pertinent (If you reviewed it)  Prior to Admission medications   Medication Sig Start Date End Date Taking? Authorizing Provider  acetaminophen (TYLENOL) 325 MG tablet Take 650 mg by mouth every 8 (eight) hours as needed for mild pain.    Yes [provider]  ALPRAZolam (XANAX) 0.25 MG tablet Take 1 tablet (0.25 mg total) by mouth every 8 (eight) hours as needed for anxiety. Patient taking differently: Take 0.25 mg by mouth 2 (two) times daily as needed for anxiety.  05/02/17  Yes Barton Dubois, MD  anastrozole (ARIMIDEX) 1 MG tablet Take 1 tablet (1 mg total) daily by mouth. 05/13/17  Yes Truitt Merle, MD  ELIQUIS 2.5 MG TABS tablet TAKE 1 TABLET BY MOUTH TWICE A DAY 02/24/17  Yes Nahser, Wonda Cheng, MD  hydrochlorothiazide (HYDRODIURIL) 25 MG tablet Take 25 mg by mouth daily.   Yes [provider]  metoprolol succinate (TOPROL-XL) 50 MG 24 hr tablet TAKE 1 TABLET BY MOUTH DAILY 01/20/17  Yes Nahser, Wonda Cheng, MD  NITROSTAT 0.4 MG SL tablet PLACE 1 TABLET (0.4 MG TOTAL) UNDER THE TONGUE EVERY 5 (FIVE) MINUTES AS NEEDED FOR CHEST PAIN. 04/29/16  Yes Nahser, Wonda Cheng, MD  potassium chloride (K-DUR,KLOR-CON) 10 MEQ tablet Take 10 mEq by mouth daily.    Yes [provider]  quinapril (ACCUPRIL) 40 MG tablet Take 40 mg by mouth 2 (two) times daily.    Yes [provider]  simvastatin (ZOCOR) 40 MG tablet Take 40 mg by mouth at bedtime.    Yes Nahser, Wonda Cheng, MD  traMADol (ULTRAM) 50 MG tablet Take 1 tablet (50 mg total) by mouth every 8 (eight) hours as needed for moderate pain or severe pain (not relieved by tylenol #3). Patient taking differently: Take 50 mg by mouth every 6 (six) hours as needed for moderate pain.  05/02/17  Yes Barton Dubois, MD  zolpidem (AMBIEN) 10 MG tablet Take 10 mg by mouth at bedtime as needed for sleep.  09/15/13  Yes [provider]    Physical  Exam: Vitals:   09/03/17 1200 09/03/17 1300 09/03/17 1330 09/03/17 1453  BP: (!) 123/112 (!) 135/59  (!) 125/55  Pulse:  (!) 53 69 71  Resp: 18 18 17 17   Temp:    97.9 F (36.6 C)  TempSrc:    Axillary  SpO2:  94% 95% 98%  Weight:    49.8 kg (109 lb 12.6 oz)  Height:    4\' 10"  (1.473 m)    Constitutional: NAD, calm, comfortable Vitals:   09/03/17 1200 09/03/17 1300 09/03/17 1330 09/03/17 1453  BP: (!) 123/112 (!) 135/59  (!) 125/55  Pulse:  (!) 53 69 71  Resp: 18 18 17 17   Temp:    97.9 F (36.6 C)  TempSrc:    Axillary  SpO2:  94% 95% 98%  Weight:    49.8 kg (  109 lb 12.6 oz)  Height:    4\' 10"  (1.473 m)   Eyes: Anicteric ENMT: Dry mucous membranes Neck: normal, supple Respiratory: clear to auscultation bilaterally, no wheezing, no crackles. Normal respiratory effort. No accessory muscle use.  Cardiovascular: 3 out of 6 high-pitched systolic murmur heard across precordium, no rubs Abdomen: Scaphoid, soft, nontender Musculoskeletal: Trace lower extremity edema bilaterally Skin: Erythema surrounding right foot with predominance around the dependent areas and significant libido reticularis, nontender to palpation Neurologic: Intact moving all extremities.  Psychiatric: Alert but only oriented to self and place.  (Anything < 9 systems with 2 bullets each down codes to level 1) (If patient refuses exam can't bill higher level) (Make sure to document decubitus ulcers present on admission -- if possible -- and whether patient has chronic indwelling catheter at time of admission)  Labs on Admission: I have personally reviewed following labs and imaging studies  CBC: Recent Labs  Lab 09/02/17 2020  WBC 10.5  NEUTROABS 8.2*  HGB 14.1  HCT 42.8  MCV 83.6  PLT 841   Basic Metabolic Panel: Recent Labs  Lab 09/02/17 2230  NA 134*  K 3.9  CL 100*  CO2 26  GLUCOSE 127*  BUN 43*  CREATININE 1.18*  CALCIUM 8.4*   GFR: Estimated Creatinine Clearance: 21.4 mL/min (A)  (by C-G formula based on SCr of 1.18 mg/dL (H)). Liver Function Tests: Recent Labs  Lab 09/02/17 2230  AST 90*  ALT 103*  ALKPHOS 130*  BILITOT 0.8  PROT 5.3*  ALBUMIN 3.3*   No results for input(s): LIPASE, AMYLASE in the last 168 hours. No results for input(s): AMMONIA in the last 168 hours. Coagulation Profile: Recent Labs  Lab 09/02/17 2230  INR 1.34   Cardiac Enzymes: No results for input(s): CKTOTAL, CKMB, CKMBINDEX, TROPONINI in the last 168 hours. BNP (last 3 results) No results for input(s): PROBNP in the last 8760 hours. HbA1C: No results for input(s): HGBA1C in the last 72 hours. CBG: No results for input(s): GLUCAP in the last 168 hours. Lipid Profile: No results for input(s): CHOL, HDL, LDLCALC, TRIG, CHOLHDL, LDLDIRECT in the last 72 hours. Thyroid Function Tests: No results for input(s): TSH, T4TOTAL, FREET4, T3FREE, THYROIDAB in the last 72 hours. Anemia Panel: No results for input(s): VITAMINB12, FOLATE, FERRITIN, TIBC, IRON, RETICCTPCT in the last 72 hours. Urine analysis:    Component Value Date/Time   COLORURINE STRAW (A) 04/28/2017 2011   APPEARANCEUR CLEAR 04/28/2017 2011   LABSPEC 1.006 04/28/2017 2011   PHURINE 7.0 04/28/2017 2011   GLUCOSEU NEGATIVE 04/28/2017 2011   HGBUR MODERATE (A) 04/28/2017 2011   BILIRUBINUR NEGATIVE 04/28/2017 2011   KETONESUR 20 (A) 04/28/2017 2011   PROTEINUR NEGATIVE 04/28/2017 2011   UROBILINOGEN 0.2 03/01/2015 0932   NITRITE NEGATIVE 04/28/2017 2011   LEUKOCYTESUR NEGATIVE 04/28/2017 2011    Radiological Exams on Admission: Dg Chest 2 View  Result Date: 09/02/2017 CLINICAL DATA:  Foot swelling EXAM: CHEST - 2 VIEW COMPARISON:  04/28/2017 FINDINGS: Left-sided pacing device as before. Small bilateral pleural effusions. Cardiomegaly with aortic atherosclerosis. No pneumothorax. Advanced degenerative changes of both shoulders. IMPRESSION: 1. Small bilateral pleural effusions 2. Cardiomegaly Electronically Signed    By: Donavan Foil M.D.   On: 09/02/2017 21:38   Dg Foot Complete Right  Result Date: 09/02/2017 CLINICAL DATA:  Right foot pain and swelling EXAM: RIGHT FOOT COMPLETE - 3+ VIEW COMPARISON:  None. FINDINGS: No acute displaced fracture is seen. Vascular calcifications. Hallux valgus deformity at the  first MTP joint with degenerative changes. Arthritis at the second third and fifth PIP joints and the second through fifth DIP joints. Diffuse degenerative changes at the TMT joints. No bony destruction. No soft tissue gas. Small plantar calcaneal spur. IMPRESSION: 1. No definite acute osseous abnormality 2. Arthritis within the digits of the foot. Hallux valgus deformity at the first MTP joint Electronically Signed   By: Donavan Foil M.D.   On: 09/02/2017 21:43    EKG: Independently reviewed.  Paced rhythm  Assessment/Plan Principal Problem:   Cellulitis Active Problems:   Hypertension   Pacemaker-[employer]   Aortic stenosis   Atrioventricular block, complete (HCC)   Atrial fibrillation (HCC)   Spinal stenosis, lumbar region, without neurogenic claudication   Mixed hyperlipidemia   Acute renal insufficiency   #) Right lower extremity cellulitis: Some purulence is present.  She does not have a high white count. -IV clindamycin, transition to oral when ready for discharge - We will hold on DVT ultrasound at this time his edema has improved dramatically since starting antibiotics  #) Acute kidney injury: Likely in the setting of cellulitis.  Patient clinically appears to be mildly hypovolemic.  Will judiciously give IV fluids in the setting of severe and reportedly untreatable aortic stenosis -Normal saline 75 mL's an hour -Avoid nephrotoxins including quinapril  #) Elevated liver function enzymes: Appears to be primarily hepatocellular pattern of injury.  Suspect related to either medications she is currently on versus transient elevation in the setting of illness. - Repeat tomorrow, if still  elevated could consider outpatient follow-up  #) Paroxysmal atrial fibrillation/flutter comp gated by complete heart block status post pacer: -Continue apixaban 2.5 mg twice daily -Continue metoprolol succinate 50 mg daily next line #) Hypertension: -Continue HCTZ 25 mg daily - Continue metoprolol -Hold ACE inhibitor  #) Hyperlipidemia: -Continue simvastatin 40 mg  #) Anxiety: -Continue alprazolam 0.25 mg every 8 hours as needed Fluids: IV fluids Elect lites: Monitor and supplement Impression: Heart healthy diet  Prophylaxis: On apixaban  Disposition: Pending oral antibiotics and improved saline as  DO NOT RESUSCITATE  Cristy Folks MD [facility]  If 7PM-7AM, please contact night-coverage [email or web address] Password [facility]  09/03/2017, 3:38 PM

## 2017-09-04 LAB — COMPREHENSIVE METABOLIC PANEL WITH GFR
ALT: 77 U/L — ABNORMAL HIGH (ref 14–54)
AST: 52 U/L — ABNORMAL HIGH (ref 15–41)
Albumin: 3.1 g/dL — ABNORMAL LOW (ref 3.5–5.0)
BUN: 39 mg/dL — ABNORMAL HIGH (ref 6–20)
Chloride: 102 mmol/L (ref 101–111)
Creatinine, Ser: 1.11 mg/dL — ABNORMAL HIGH (ref 0.44–1.00)
Glucose, Bld: 124 mg/dL — ABNORMAL HIGH (ref 65–99)
Potassium: 3.5 mmol/L (ref 3.5–5.1)
Total Bilirubin: 0.9 mg/dL (ref 0.3–1.2)

## 2017-09-04 LAB — CBC
HCT: 39.3 % (ref 36.0–46.0)
Hemoglobin: 12.2 g/dL (ref 12.0–15.0)
MCH: 26.9 pg (ref 26.0–34.0)
MCHC: 31 g/dL (ref 30.0–36.0)
MCV: 86.8 fL (ref 78.0–100.0)
Platelets: 229 K/uL (ref 150–400)
RBC: 4.53 MIL/uL (ref 3.87–5.11)
RDW: 16.5 % — ABNORMAL HIGH (ref 11.5–15.5)
WBC: 7.3 K/uL (ref 4.0–10.5)

## 2017-09-04 LAB — COMPREHENSIVE METABOLIC PANEL
Alkaline Phosphatase: 111 U/L (ref 38–126)
Anion gap: 10 (ref 5–15)
CO2: 25 mmol/L (ref 22–32)
Calcium: 8.7 mg/dL — ABNORMAL LOW (ref 8.9–10.3)
GFR calc Af Amer: 48 mL/min — ABNORMAL LOW (ref >60)
GFR calc non Af Amer: 42 mL/min — ABNORMAL LOW (ref >60)
Sodium: 137 mmol/L (ref 135–145)
Total Protein: 5.1 g/dL — ABNORMAL LOW (ref 6.5–8.1)

## 2017-09-04 MED ORDER — DOXYCYCLINE HYCLATE 100 MG PO TABS: 100.0000 mg | ORAL_TABLET | Freq: Two times a day (BID) | ORAL | 0 refills | Status: DC

## 2017-09-04 MED ORDER — HYDROCHLOROTHIAZIDE 12.5 MG PO TABS: 12.5000 mg | ORAL_TABLET | Freq: Every day | ORAL | 2 refills | Status: DC

## 2017-09-04 MED ORDER — LACTINEX PO CHEW: 1.0000 | CHEWABLE_TABLET | Freq: Three times a day (TID) | ORAL | 0 refills | Status: AC

## 2017-09-04 MED ORDER — ALPRAZOLAM 0.25 MG PO TABS: 0.2500 mg | ORAL_TABLET | Freq: Three times a day (TID) | ORAL | 0 refills | Status: AC | PRN

## 2017-09-04 NOTE — Discharge Summary (Signed)
Erin Charles, is a 84 y.o. female  DOB March 10, 1923  MRN [id number].  Admission date:  09/02/2017  Admitting Physician  Rise Patience, MD  Discharge Date:  09/04/2017   Primary MD  Hulan Fess, MD  Recommendations for primary care physician for things to follow:   1)Take medications including doxycycline antibiotic as prescribed 2)Eat  yogurt and use lactobacillus to prevent stomach/bowel problems while on antibiotics 3)Call if fever, chills, worsening leg/lower extremity redness warmth swelling or tenderness 4)Follow-up with your primary care doctor in 1-2 weeks for recheck   Admission Diagnosis  Acute renal insufficiency [N28.9] Cellulitis of lower extremity, unspecified laterality [L57.262]   Discharge Diagnosis  Acute renal insufficiency [N28.9] Cellulitis of lower extremity, unspecified laterality [M35.597]    Principal Problem:   Cellulitis Active Problems:   Hypertension   Pacemaker-[employer]   Aortic stenosis   Atrioventricular block, complete (HCC)   Atrial fibrillation ([place])   Spinal stenosis, lumbar region, without neurogenic claudication   Mixed hyperlipidemia   Acute renal insufficiency      Past Medical History:  Diagnosis Date  . Advanced age   . Atrial flutter ([place])   . Chronic pain    a. followed in pain mgmt.  . Complete heart block ([place])    Pacer  . 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 Dyslipidemia   . HTN (hypertension)   . Macular degeneration   . Mild CAD    a. LHC 08/2014: 40% D1, 30% LM.  . OA (osteoarthritis)   . PAF (paroxysmal atrial fibrillation) ([place])   . RBBB (right bundle branch block)   . Severe aortic stenosis    a. pt has declined TAVR.    Past Surgical History:  Procedure Laterality Date  . EYE SURGERY    . HERNIA REPAIR    . INSERT / REPLACE / REMOVE PACEMAKER  08/30/10   DUAL CHAMBER/ST. JUDE  . LEFT AND  RIGHT HEART CATHETERIZATION WITH CORONARY ANGIOGRAM N/A 08/09/2014   Procedure: LEFT AND RIGHT HEART CATHETERIZATION WITH CORONARY ANGIOGRAM;  Surgeon: Burnell Blanks, MD;  Location: [facility] CATH LAB;  Service: Cardiovascular;  Laterality: N/A;  . PACEMAKER INSERTION    . PILONIDAL CYST EXCISION         HPI  from the history and physical done on the day of admission:     Patient coming from: Assisted living facility   Chief Complaint: Right lower extremity cellulitis  HPI: Erin Charles is a 84 y.o. female with medical history significant of severe aortic stenosis valve area mean of 0.51 cm on echo on 04/29/2017, paroxysmal atrial fibrillation/flutter complicated by complete heart block status post pacer placement on anticoagulation, dementia, hypertension, anxiety, hyperlipidemia who was brought in from assisted living facility with right lower extremity cellulitis.  Patient has dementia and is a very poor historian.  She apparently is chronically bedbound and uses a wheelchair most of the time.  She rarely walks.  Her daughter came to see her yesterday and noted  that her sock was wet when she went to change her shoes.  Removing the socks she noted that there was a purulent area with some significant drainage and surrounding erythema and edema.  Patient herself denies any fevers pain, chills however the daughter reported the patient "did not look good".  Daughter took patient to [facility] ER.   ED Course: In the ED patient's vitals were fairly unremarkable.  She was given IV clindamycin.  Labs were notable for a normal white count.  Patient was noted to have mildly elevated creatinine of 1.18 with elevated BUN.  Patient was also noted to have mildly elevated AST and ALT and alkaline phosphatase.  Patient was transferred here for management of cellulitis in the setting of AK I.    Hospital Course:     1)Right Lower Extremity cellulitis: Clinically much improved, was  treated with IV clindamycin, after discussions with patient and son we discharged home on p.o. doxycycline .  No fevers or leukocytosis, lower extremity erythema warmth swelling has improved significantly  2)Paroxysmal Afib-Flutter--stable, continue Eliquis for anticoagulation continue metoprolol for rate control   3)) Acute kidney injury: Creatinine is 1.1, avoid nephrotoxic agents  4) Elevated liver function enzymes: Avoid hepatotoxic agents, repeat CMP with PCP in a couple weeks  5) Hyperlipidemia: -Continue simvastatin 40 mg  6) Anxiety: -Continue alprazolam 0.25 mg every 8 hours as needed    Discharge Condition: Home  Follow UP- PCP for CMP recheck in a couple weeks  Diet and Activity recommendation:  As advised  Discharge Instructions    Discharge Instructions    Call MD for:   Complete by:  As directed    Call MD for:  extreme fatigue   Complete by:  As directed    Call MD for:  persistant dizziness or light-headedness   Complete by:  As directed    Call MD for:  persistant nausea and vomiting   Complete by:  As directed    Call MD for:  temperature >100.4   Complete by:  As directed    Diet - low sodium heart healthy   Complete by:  As directed    Discharge instructions   Complete by:  As directed    1) take medications including doxycycline antibiotic as prescribed 2)Eat  yogurt and use lactobacillus to prevent stomach/bowel problems while on antibiotics 3) call if fever, chills, worsening leg/lower extremity redness warmth swelling or tenderness 4) follow-up with your primary care doctor in 1-2 weeks for recheck   Increase activity slowly   Complete by:  As directed        Discharge Medications     Allergies as of 09/04/2017      Reactions   Macrodantin Rash   Penicillins Rash, Other (See Comments)   Many years ago. Has patient had a PCN reaction causing immediate rash, facial/tongue/throat swelling, SOB or lightheadedness with hypotension: unknown Has  patient had a PCN reaction causing severe rash involving mucus membranes or skin necrosis: unknown Has patient had a PCN reaction that required hospitalization: unknown Has patient had a PCN reaction occurring within the last 10 years: No If all of the above answers are "NO", then may proceed with Cephalosporin use.   Tolectin [tolmetin Sodium] Rash      Medication List    TAKE these medications   acetaminophen 325 MG tablet Commonly known as:  TYLENOL Take 650 mg by mouth every 8 (eight) hours as needed for mild pain.   ALPRAZolam 0.25  MG tablet Commonly known as:  XANAX Take 1 tablet (0.25 mg total) by mouth every 8 (eight) hours as needed for anxiety. What changed:  when to take this   anastrozole 1 MG tablet Commonly known as:  ARIMIDEX Take 1 tablet (1 mg total) daily by mouth.   doxycycline 100 MG tablet Commonly known as:  VIBRA-TABS Take 1 tablet (100 mg total) by mouth 2 (two) times daily.   ELIQUIS 2.5 MG Tabs tablet Generic drug:  apixaban TAKE 1 TABLET BY MOUTH TWICE A DAY   hydrochlorothiazide 12.5 MG tablet Commonly known as:  HYDRODIURIL Take 1 tablet (12.5 mg total) by mouth daily. What changed:    medication strength  how much to take   lactobacillus acidophilus & bulgar chewable tablet Chew 1 tablet by mouth 3 (three) times daily with meals.   metoprolol succinate 50 MG 24 hr tablet Commonly known as:  TOPROL-XL TAKE 1 TABLET BY MOUTH DAILY   NITROSTAT 0.4 MG SL tablet Generic drug:  nitroGLYCERIN PLACE 1 TABLET (0.4 MG TOTAL) UNDER THE TONGUE EVERY 5 (FIVE) MINUTES AS NEEDED FOR CHEST PAIN.   potassium chloride 10 MEQ tablet Commonly known as:  K-DUR,KLOR-CON Take 10 mEq by mouth daily.   quinapril 40 MG tablet Commonly known as:  ACCUPRIL Take 40 mg by mouth 2 (two) times daily.   simvastatin 40 MG tablet Commonly known as:  ZOCOR Take 40 mg by mouth at bedtime.   traMADol 50 MG tablet Commonly known as:  ULTRAM Take 1 tablet (50 mg  total) by mouth every 8 (eight) hours as needed for moderate pain or severe pain (not relieved by tylenol #3). What changed:    when to take this  reasons to take this   zolpidem 10 MG tablet Commonly known as:  AMBIEN Take 10 mg by mouth at bedtime as needed for sleep.       Major procedures and Radiology Reports - PLEASE review detailed and final reports for all details, in brief -   Dg Chest 2 View  Result Date: 09/02/2017 CLINICAL DATA:  Foot swelling EXAM: CHEST - 2 VIEW COMPARISON:  04/28/2017 FINDINGS: Left-sided pacing device as before. Small bilateral pleural effusions. Cardiomegaly with aortic atherosclerosis. No pneumothorax. Advanced degenerative changes of both shoulders. IMPRESSION: 1. Small bilateral pleural effusions 2. Cardiomegaly Electronically Signed   By: Donavan Foil M.D.   On: 09/02/2017 21:38   Dg Foot Complete Right  Result Date: 09/02/2017 CLINICAL DATA:  Right foot pain and swelling EXAM: RIGHT FOOT COMPLETE - 3+ VIEW COMPARISON:  None. FINDINGS: No acute displaced fracture is seen. Vascular calcifications. Hallux valgus deformity at the first MTP joint with degenerative changes. Arthritis at the second third and fifth PIP joints and the second through fifth DIP joints. Diffuse degenerative changes at the TMT joints. No bony destruction. No soft tissue gas. Small plantar calcaneal spur. IMPRESSION: 1. No definite acute osseous abnormality 2. Arthritis within the digits of the foot. Hallux valgus deformity at the first MTP joint Electronically Signed   By: Donavan Foil M.D.   On: 09/02/2017 21:43    Micro Results   Recent Results (from the past 240 hour(s))  Culture, blood (routine x 2)     Status: None (Preliminary result)   Collection Time: 09/02/17  8:40 PM  Result Value Ref Range Status   Specimen Description   Final    BLOOD LEFT ANTECUBITAL Performed at [facility], [address] [place], [place] [address]  Special Requests   Final     BOTTLES DRAWN AEROBIC AND ANAEROBIC Blood Culture adequate volume Performed at [facility], [address] [place], [place] [address]    Culture   Final    NO GROWTH 2 DAYS Performed at [facility], [place] [address], [place], [place] [address]    Report Status PENDING  Incomplete  Culture, blood (routine x 2)     Status: None (Preliminary result)   Collection Time: 09/02/17  8:45 PM  Result Value Ref Range Status   Specimen Description   Final    BLOOD RIGHT ANTECUBITAL Performed at [facility], [address] [place], [place] [address]    Special Requests   Final    BOTTLES DRAWN AEROBIC AND ANAEROBIC Blood Culture adequate volume Performed at [facility], [address] [place], [place] [address]    Culture   Final    NO GROWTH 2 DAYS Performed at [facility], [place] [address] [place], [place] [address]    Report Status PENDING  Incomplete  MRSA PCR Screening     Status: None   Collection Time: 09/03/17  3:13 PM  Result Value Ref Range Status   MRSA by PCR NEGATIVE NEGATIVE Final    Comment:        The GeneXpert MRSA Assay (FDA approved for NASAL specimens only), is one component of a comprehensive MRSA colonization surveillance program. It is not intended to diagnose MRSA infection nor to guide or monitor treatment for MRSA infections. Performed at [facility], [place] [address], [place], [place] [address]        Today   Subjective    Erin Charles today has no new complaints, no f/c, no n/v, son at bedside          Patient has been seen and examined prior to discharge   Objective   Blood pressure 109/63, pulse 66, temperature (!) 97.5 F (36.4 C), temperature source Oral, resp. rate 20, height 4\' 10"  (1.473 m), weight 49.8 kg (109 lb 12.6 oz), SpO2 98 %.   Intake/Output Summary (Last 24 hours) at 09/04/2017 1344 Last data filed at 09/04/2017 0900 Gross per 24 hour  Intake 988.75 ml    Output -  Net 988.75 ml    Exam Gen:- Awake  In no apparent distress  HEENT:- [place].AT,   Neck-Supple Neck,No JVD,  Lungs- mostly clear  CV- S1, S2 normal, irregular, 3/6 systolic murmur Abd-  +ve B.Sounds, Abd Soft, No tenderness,    Extremity/Skin:- Intact peripheral pulses  , lower extremity edema, erythema and warmth has improved significantly since admission, no streaking, no significant drainage noted at this time   Data Review   CBC w Diff:  Lab Results  Component Value Date   WBC 7.3 09/04/2017   HGB 12.2 09/04/2017   HGB 12.6 01/22/2016   HCT 39.3 09/04/2017   HCT 38.7 01/22/2016   PLT 229 09/04/2017   PLT 217 01/22/2016   LYMPHOPCT 11 09/02/2017   LYMPHOPCT 18.8 01/22/2016   MONOPCT 10 09/02/2017   MONOPCT 11.2 01/22/2016   EOSPCT 1 09/02/2017   EOSPCT 11.5 (H) 01/22/2016   BASOPCT 0 09/02/2017   BASOPCT 1.4 01/22/2016    CMP:  Lab Results  Component Value Date   NA 137 09/04/2017   NA 140 08/13/2016   NA 139 01/22/2016   K 3.5 09/04/2017   K 4.3 01/22/2016   CL  102 09/04/2017   CL 102 10/26/2012   CO2 25 09/04/2017   CO2 28 01/22/2016   BUN 39 (H) 09/04/2017   BUN 19 08/13/2016   BUN 20.9 01/22/2016   CREATININE 1.11 (H) 09/04/2017   CREATININE 1.1 01/22/2016   PROT 5.1 (L) 09/04/2017   PROT 6.8 08/13/2016   PROT 6.8 01/22/2016   ALBUMIN 3.1 (L) 09/04/2017   ALBUMIN 4.5 08/13/2016   ALBUMIN 4.1 01/22/2016   BILITOT 0.9 09/04/2017   BILITOT 0.5 08/13/2016   BILITOT 0.50 01/22/2016   ALKPHOS 111 09/04/2017   ALKPHOS 122 01/22/2016   AST 52 (H) 09/04/2017   AST 25 01/22/2016   ALT 77 (H) 09/04/2017   ALT 11 01/22/2016  .   Total Discharge time is about 33 minutes  Roxan Hockey M.D on 09/04/2017 at 1:44 PM  [facility]   Office  [phone]  Voice Recognition Viviann Spare dictation system was used to create this note, attempts have been made to correct errors. Please contact the author with questions and/or clarifications.

## 2017-09-04 NOTE — Discharge Instructions (Signed)
1) take medications including doxycycline antibiotic as prescribed 2)Eat  yogurt and use lactobacillus to prevent stomach/bowel problems while on antibiotics 3) call if fever, chills, worsening leg/lower extremity redness warmth swelling or tenderness 4) follow-up with your primary care doctor in 1-2 weeks for recheck

## 2017-09-05 ENCOUNTER — Telehealth: Payer: Self-pay

## 2017-09-05 NOTE — Telephone Encounter (Signed)
Charlett Blake residential floor nurse for [facility] called requesting home health nursing for mrs Skipper.  She stated patient was released recently from hospital with weeping wounds on both her legs and is asking for home health nursing wound care.

## 2017-09-07 ENCOUNTER — Emergency Department (HOSPITAL_COMMUNITY)
Admission: EM | Admit: 2017-09-07 | Discharge: 2017-09-07 | Disposition: A | Discharge status: Home / Self Care | Payer: Medicare Other | Source: Home / Self Care | Attending: Emergency Medicine | Admitting: Emergency Medicine

## 2017-09-07 ENCOUNTER — Emergency Department (HOSPITAL_COMMUNITY): Payer: Medicare Other

## 2017-09-07 ENCOUNTER — Encounter (HOSPITAL_COMMUNITY): Payer: Self-pay

## 2017-09-07 ENCOUNTER — Other Ambulatory Visit: Payer: Self-pay

## 2017-09-07 DIAGNOSIS — I5082 Biventricular heart failure: Secondary | ICD-10-CM | POA: Diagnosis present

## 2017-09-07 DIAGNOSIS — Z950 Presence of cardiac pacemaker: Secondary | ICD-10-CM | POA: Insufficient documentation

## 2017-09-07 DIAGNOSIS — I080 Rheumatic disorders of both mitral and aortic valves: Secondary | ICD-10-CM | POA: Diagnosis present

## 2017-09-07 DIAGNOSIS — I130 Hypertensive heart and chronic kidney disease with heart failure and stage 1 through stage 4 chronic kidney disease, or unspecified chronic kidney disease: Secondary | ICD-10-CM | POA: Diagnosis not present

## 2017-09-07 DIAGNOSIS — J90 Pleural effusion, not elsewhere classified: Secondary | ICD-10-CM | POA: Insufficient documentation

## 2017-09-07 DIAGNOSIS — Z79811 Long term (current) use of aromatase inhibitors: Secondary | ICD-10-CM

## 2017-09-07 DIAGNOSIS — F0390 Unspecified dementia without behavioral disturbance: Secondary | ICD-10-CM

## 2017-09-07 DIAGNOSIS — D0512 Intraductal carcinoma in situ of left breast: Secondary | ICD-10-CM | POA: Diagnosis present

## 2017-09-07 DIAGNOSIS — Z993 Dependence on wheelchair: Secondary | ICD-10-CM

## 2017-09-07 DIAGNOSIS — R600 Localized edema: Secondary | ICD-10-CM | POA: Insufficient documentation

## 2017-09-07 DIAGNOSIS — Z79899 Other long term (current) drug therapy: Secondary | ICD-10-CM | POA: Insufficient documentation

## 2017-09-07 DIAGNOSIS — R609 Edema, unspecified: Secondary | ICD-10-CM

## 2017-09-07 DIAGNOSIS — I259 Chronic ischemic heart disease, unspecified: Secondary | ICD-10-CM

## 2017-09-07 DIAGNOSIS — Z7901 Long term (current) use of anticoagulants: Secondary | ICD-10-CM

## 2017-09-07 DIAGNOSIS — N183 Chronic kidney disease, stage 3 (moderate): Secondary | ICD-10-CM | POA: Diagnosis present

## 2017-09-07 DIAGNOSIS — I272 Pulmonary hypertension, unspecified: Secondary | ICD-10-CM | POA: Diagnosis present

## 2017-09-07 DIAGNOSIS — I5043 Acute on chronic combined systolic (congestive) and diastolic (congestive) heart failure: Secondary | ICD-10-CM | POA: Diagnosis present

## 2017-09-07 DIAGNOSIS — E876 Hypokalemia: Secondary | ICD-10-CM | POA: Diagnosis not present

## 2017-09-07 DIAGNOSIS — I471 Supraventricular tachycardia: Secondary | ICD-10-CM | POA: Diagnosis present

## 2017-09-07 DIAGNOSIS — I10 Essential (primary) hypertension: Secondary | ICD-10-CM

## 2017-09-07 DIAGNOSIS — Z8052 Family history of malignant neoplasm of bladder: Secondary | ICD-10-CM

## 2017-09-07 DIAGNOSIS — H3530 Unspecified macular degeneration: Secondary | ICD-10-CM | POA: Diagnosis present

## 2017-09-07 DIAGNOSIS — I482 Chronic atrial fibrillation: Secondary | ICD-10-CM | POA: Diagnosis present

## 2017-09-07 DIAGNOSIS — Z8249 Family history of ischemic heart disease and other diseases of the circulatory system: Secondary | ICD-10-CM

## 2017-09-07 DIAGNOSIS — Z515 Encounter for palliative care: Secondary | ICD-10-CM | POA: Diagnosis present

## 2017-09-07 DIAGNOSIS — E782 Mixed hyperlipidemia: Secondary | ICD-10-CM | POA: Diagnosis present

## 2017-09-07 DIAGNOSIS — I2510 Atherosclerotic heart disease of native coronary artery without angina pectoris: Secondary | ICD-10-CM | POA: Diagnosis present

## 2017-09-07 DIAGNOSIS — Z66 Do not resuscitate: Secondary | ICD-10-CM | POA: Diagnosis present

## 2017-09-07 DIAGNOSIS — I442 Atrioventricular block, complete: Secondary | ICD-10-CM | POA: Diagnosis present

## 2017-09-07 LAB — CBC WITH DIFFERENTIAL/PLATELET
Basophils Absolute: 0 10*3/uL (ref 0.0–0.1)
Basophils Relative: 0 %
Eosinophils Absolute: 0.2 10*3/uL (ref 0.0–0.7)
Eosinophils Relative: 2 %
HCT: 43.9 % (ref 36.0–46.0)
Hemoglobin: 14.2 g/dL (ref 12.0–15.0)
Lymphocytes Relative: 14 %
Lymphs Abs: 1.1 10*3/uL (ref 0.7–4.0)
MCH: 26.8 pg (ref 26.0–34.0)
MCHC: 32.3 g/dL (ref 30.0–36.0)
MCV: 82.8 fL (ref 78.0–100.0)
Monocytes Absolute: 0.8 10*3/uL (ref 0.1–1.0)
Monocytes Relative: 10 %
Neutro Abs: 5.9 10*3/uL (ref 1.7–7.7)
Neutrophils Relative %: 74 %
Platelets: 254 10*3/uL (ref 150–400)
RBC: 5.3 MIL/uL — ABNORMAL HIGH (ref 3.87–5.11)
RDW: 16.2 % — ABNORMAL HIGH (ref 11.5–15.5)
WBC: 7.9 10*3/uL (ref 4.0–10.5)

## 2017-09-07 LAB — BRAIN NATRIURETIC PEPTIDE

## 2017-09-07 LAB — COMPREHENSIVE METABOLIC PANEL
ALK PHOS: 124 U/L (ref 38–126)
ALT: 48 U/L (ref 14–54)
AST: 39 U/L (ref 15–41)
Albumin: 3.5 g/dL (ref 3.5–5.0)
Anion gap: 10 (ref 5–15)
BILIRUBIN TOTAL: 0.6 mg/dL (ref 0.3–1.2)
BUN: 44 mg/dL — AB (ref 6–20)
CO2: 27 mmol/L (ref 22–32)
CREATININE: 1.09 mg/dL — AB (ref 0.44–1.00)
Calcium: 9.5 mg/dL (ref 8.9–10.3)
Chloride: 102 mmol/L (ref 101–111)
GFR calc Af Amer: 49 mL/min — ABNORMAL LOW (ref >60)
GFR calc non Af Amer: 42 mL/min — ABNORMAL LOW (ref >60)
GLUCOSE: 118 mg/dL — AB (ref 65–99)
Potassium: 3.9 mmol/L (ref 3.5–5.1)
Sodium: 139 mmol/L (ref 135–145)
TOTAL PROTEIN: 5.6 g/dL — AB (ref 6.5–8.1)

## 2017-09-07 LAB — CULTURE, BLOOD (ROUTINE X 2)
CULTURE: NO GROWTH
Culture: NO GROWTH
Special Requests: ADEQUATE
Special Requests: ADEQUATE

## 2017-09-07 MED ORDER — TRAMADOL HCL 50 MG PO TABS
50.0000 mg | ORAL_TABLET | Freq: Once | ORAL | Status: AC
  Administered 2017-09-07: 50 mg via ORAL
  Filled 2017-09-07: qty 1

## 2017-09-07 NOTE — Discharge Instructions (Signed)
Leaking fluid is from swelling in the legs, and abrasions which are allowing the fluid to leak out.  Try to avoid scratching.  Moisturize the skin with a reticulocyte such as Lubriderm.  Use compression stockings on both legs up to the knees to reduce fluid in the lower legs.  Also elevate the legs, as much as possible during the day.  Avoid salt which can cause fluid retention.  The testing today showed that you are a little dehydrated.  Make sure that you are eating and drinking, a normal diet.  Chest x-ray showed small pleural effusions, on both sides, without clear cause noted.  If these worsen they may become a problem.  When you see your doctor this Thursday, asking about the effusions and arrange for further care.

## 2017-09-07 NOTE — ED Triage Notes (Signed)
She came to [place] from a [place] ([place]) with c/o persistent cellulitis in spite of being on antibiotic(s) for same of bilat. Lower extremities. She is in no distress. Her legs are weeping a quantity of serous drng.

## 2017-09-07 NOTE — ED Notes (Addendum)
This writer called down to the lab to ask about the BNP. They will look for it and process it if found. Will draw again if not processed shortly. Christ Kick, MD, notified.

## 2017-09-07 NOTE — ED Provider Notes (Signed)
[facility] DEPT Provider Note   CSN: [id number] Arrival date & time: 09/07/17  1429     History   Chief Complaint Chief Complaint  Patient presents with  . Edema    HPI Erin Charles is a 84 y.o. female.  She is here for swelling in legs with drainage from lower legs bilaterally, right greater than left.  Recently hospitalized, and daughter feels like she was "discharged too early."  She lives in an assisted living facility, but does not currently have home health services there.  She was hospitalized on 09/02/2017 and discharged 2 days later with doxycycline to treat right lower extremity cellulitis and advised to follow-up with her PCP in 1-2 weeks.  Patient is changing dressings on her lower legs twice since that time, and they are typically fluid soaked and her legs have been "cold."  She has aortic stenosis, but does not have known congestive heart failure.  She has a pacemaker for complete heart block.  She is on low-dose diuretic with hydrochlorothiazide. 12.5 mg.  There are no other known modifying factors.   HPI  Past Medical History:  Diagnosis Date  . Advanced age   . Atrial flutter ([place])   . Chronic pain    a. followed in pain mgmt.  . Complete heart block ([place])    Pacer  . 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 Dyslipidemia   . HTN (hypertension)   . Macular degeneration   . Mild CAD    a. LHC 08/2014: 40% D1, 30% LM.  . OA (osteoarthritis)   . PAF (paroxysmal atrial fibrillation) ([place])   . RBBB (right bundle branch block)   . Severe aortic stenosis    a. pt has declined TAVR.    Patient Active Problem List   Diagnosis Date Noted  . Acute renal insufficiency 09/03/2017  . Cellulitis 09/02/2017  . Urinary retention   . Physical deconditioning   . Dementia without behavioral disturbance   . Anxiety   . Hypokalemia   . Hyponatremia 04/28/2017  . Back pain 04/28/2017  . Elevated troponin 04/28/2017    . Mixed hyperlipidemia 08/13/2016  . Ductal carcinoma in situ (DCIS) of left breast 07/21/2015  . Traumatic ecchymosis of groin 08/18/2014  . Trochanteric bursitis of right hip 03/03/2014  . Osteoarthritis of hip 06/07/2013  . DJD (degenerative joint disease) of knee 06/07/2013  . Encounter for long-term (current) use of other medications 06/07/2013  . Spinal stenosis, lumbar region, without neurogenic claudication 06/07/2013  . Atrial fibrillation ([place]) 03/30/2013  . Atrioventricular block, complete ([place]) 12/12/2011  . Unstable angina ([place]) 08/04/2011  . Hypertension 09/27/2010  . Pacemaker-St.Jude 09/27/2010  . Aortic stenosis 09/27/2010    Past Surgical History:  Procedure Laterality Date  . EYE SURGERY    . HERNIA REPAIR    . INSERT / REPLACE / REMOVE PACEMAKER  08/30/10   DUAL CHAMBER/ST. JUDE  . LEFT AND RIGHT HEART CATHETERIZATION WITH CORONARY ANGIOGRAM N/A 08/09/2014   Procedure: LEFT AND RIGHT HEART CATHETERIZATION WITH CORONARY ANGIOGRAM;  Surgeon: Burnell Blanks, MD;  Location: [facility] CATH LAB;  Service: Cardiovascular;  Laterality: N/A;  . PACEMAKER INSERTION    . PILONIDAL CYST EXCISION      OB History    No data available       Home Medications    Prior to Admission medications   Medication Sig Start Date End Date Taking? Authorizing Provider  acetaminophen (TYLENOL) 325 MG tablet  Take 650 mg by mouth every 8 (eight) hours as needed for mild pain.     [provider]  ALPRAZolam Duanne Moron) 0.25 MG tablet Take 1 tablet (0.25 mg total) by mouth every 8 (eight) hours as needed for anxiety. 09/04/17   Roxan Hockey, MD  anastrozole (ARIMIDEX) 1 MG tablet Take 1 tablet (1 mg total) daily by mouth. 05/13/17   Truitt Merle, MD  doxycycline (VIBRA-TABS) 100 MG tablet Take 1 tablet (100 mg total) by mouth 2 (two) times daily. 09/04/17   Roxan Hockey, MD  ELIQUIS 2.5 MG TABS tablet TAKE 1 TABLET BY MOUTH TWICE A DAY 02/24/17   Nahser, Wonda Cheng, MD   hydrochlorothiazide (HYDRODIURIL) 12.5 MG tablet Take 1 tablet (12.5 mg total) by mouth daily. 09/04/17   Roxan Hockey, MD  lactobacillus acidophilus & bulgar (LACTINEX) chewable tablet Chew 1 tablet by mouth 3 (three) times daily with meals. 09/04/17   Roxan Hockey, MD  metoprolol succinate (TOPROL-XL) 50 MG 24 hr tablet TAKE 1 TABLET BY MOUTH DAILY 01/20/17   Nahser, Wonda Cheng, MD  NITROSTAT 0.4 MG SL tablet PLACE 1 TABLET (0.4 MG TOTAL) UNDER THE TONGUE EVERY 5 (FIVE) MINUTES AS NEEDED FOR CHEST PAIN. 04/29/16   Nahser, Wonda Cheng, MD  potassium chloride (K-DUR,KLOR-CON) 10 MEQ tablet Take 10 mEq by mouth daily.     [provider]  quinapril (ACCUPRIL) 40 MG tablet Take 40 mg by mouth 2 (two) times daily.     [provider]  simvastatin (ZOCOR) 40 MG tablet Take 40 mg by mouth at bedtime.     Nahser, Wonda Cheng, MD  traMADol (ULTRAM) 50 MG tablet Take 1 tablet (50 mg total) by mouth every 8 (eight) hours as needed for moderate pain or severe pain (not relieved by tylenol #3). Patient taking differently: Take 50 mg by mouth every 6 (six) hours as needed for moderate pain.  05/02/17   Barton Dubois, MD  zolpidem (AMBIEN) 10 MG tablet Take 10 mg by mouth at bedtime as needed for sleep.  09/15/13   [provider]    Family History Family History  Problem Relation Age of Onset  . Cancer Mother        Bladder cancer  . Heart failure Father   . Heart attack Sister     Social History Social History   Tobacco Use  . Smoking status: Never Smoker  . Smokeless tobacco: Never Used  Substance Use Topics  . Alcohol use: No  . Drug use: No     Allergies   Macrodantin; Penicillins; and Tolectin [tolmetin sodium]   Review of Systems Review of Systems  All other systems reviewed and are negative.    Physical Exam Updated Vital Signs BP (!) 171/94 (BP Location: Left Arm)   Pulse 77   Temp 97.7 F (36.5 C) (Oral)   Resp 20   SpO2 97%   Physical Exam   Constitutional: She is oriented to person, place, and time. She appears well-developed. No distress.  Elderly, frail  HENT:  Head: Normocephalic and atraumatic.  Eyes: Conjunctivae and EOM are normal. Pupils are equal, round, and reactive to light.  Neck: Normal range of motion and phonation normal. Neck supple.  Cardiovascular: Normal rate and regular rhythm.  Pulmonary/Chest: Effort normal and breath sounds normal. She exhibits no tenderness.  Abdominal: Soft. She exhibits no distension. There is no tenderness. There is no guarding.  Musculoskeletal: Normal range of motion. She exhibits no edema (Bilateral lower leg edema,  left greater than right.  Edema extends across the ankles and into the feet.  On arrival she has Kling wrapped around both ankles, these dressings are saturated with clear liquid.  ).  Lower legs have a few superficial abrasions from which clear liquid is draining.  There is no active drainage of fluid through intact skin.  Neurological: She is alert and oriented to person, place, and time. She exhibits normal muscle tone.  Skin: Skin is warm and dry.  Skin of lower legs is cool to touch.  Dressings were removed by me and a clean pad was placed beneath her legs, at 1550 p.m.  Psychiatric: She has a normal mood and affect. Her behavior is normal. Judgment and thought content normal.  Nursing note and vitals reviewed.    ED Treatments / Results  Labs (all labs ordered are listed, but only abnormal results are displayed) Labs Reviewed  COMPREHENSIVE METABOLIC PANEL - Abnormal; Notable for the following components:      Result Value   Glucose, Bld 118 (*)    BUN 44 (*)    Creatinine, Ser 1.09 (*)    Total Protein 5.6 (*)    GFR calc non Af Amer 42 (*)    GFR calc Af Amer 49 (*)    All other components within normal limits  CBC WITH DIFFERENTIAL/PLATELET - Abnormal; Notable for the following components:   RBC 5.30 (*)    RDW 16.2 (*)    All other components within  normal limits  BRAIN NATRIURETIC PEPTIDE - Abnormal; Notable for the following components:   B Natriuretic Peptide >4,500.0 (*)    All other components within normal limits   BUN  Date Value Ref Range Status  09/07/2017 44 (H) 6 - 20 mg/dL Final  09/04/2017 39 (H) 6 - 20 mg/dL Final  09/02/2017 43 (H) 6 - 20 mg/dL Final  05/01/2017 20 6 - 20 mg/dL Final  08/13/2016 19 10 - 36 mg/dL Final  01/22/2016 20.9 7.0 - 26.0 mg/dL Final  07/21/2015 16.0 7.0 - 26.0 mg/dL Final  11/17/2014 21.1 7.0 - 26.0 mg/dL Final  05/18/2014 20.4 7.0 - 26.0 mg/dL Final   Creatinine  Date Value Ref Range Status  01/22/2016 1.1 0.6 - 1.1 mg/dL Final  07/21/2015 1.0 0.6 - 1.1 mg/dL Final  11/17/2014 0.9 0.6 - 1.1 mg/dL Final  05/18/2014 0.9 0.6 - 1.1 mg/dL Final   Creatinine, Ser  Date Value Ref Range Status  09/07/2017 1.09 (H) 0.44 - 1.00 mg/dL Final  09/04/2017 1.11 (H) 0.44 - 1.00 mg/dL Final  09/02/2017 1.18 (H) 0.44 - 1.00 mg/dL Final  05/01/2017 0.69 0.44 - 1.00 mg/dL Final     EKG  EKG Interpretation None       Radiology Dg Chest 2 View  Result Date: 09/07/2017 CLINICAL DATA:  Bilateral lower extremity edema. EXAM: CHEST - 2 VIEW COMPARISON:  09/02/2017 FINDINGS: Cardiac silhouette is mildly enlarged. No mediastinal or hilar masses. Left anterior chest wall sequential pacemaker is stable. Moderate pleural effusions, increased when compared to the prior radiographs. Lungs are hyperexpanded. Mild basilar atelectasis. No evidence of pneumonia or pulmonary edema. No pneumothorax. Skeletal structures are demineralized but grossly intact. IMPRESSION: 1. Moderate pleural effusions which have increased in size from the prior radiographs. 2. No evidence of pneumonia or pulmonary edema. Electronically Signed   By: Lajean Manes M.D.   On: 09/07/2017 15:56    Procedures Procedures (including critical care time)  Medications Ordered in ED Medications  traMADol (ULTRAM)  tablet 50 mg (50 mg Oral  Given 09/07/17 2020)     Initial Impression / Assessment and Plan / ED Course  I have reviewed the triage vital signs and the nursing notes.  Pertinent labs & imaging results that were available during my care of the patient were reviewed by me and considered in my medical decision making (see chart for details).  Clinical Course as of Sep 08 2154  Nancy Fetter Sep 07, 2017  1728 Evaluation for lower extremity swelling, blood work indicating normal white blood cell count normal hemoglobin, normal sodium and potassium.  Creatinine mildly elevated 1.1.  Total protein low 5.6.  Chest x-ray with bilateral effusions, increased since 09/02/2017  [EW]  2107 Elevated, no comparison available. B Natriuretic Peptide: (!) >4,500.0 [EW]    Clinical Course User Index [EW] Daleen Bo, MD     Patient Vitals for the past 24 hrs:  BP Temp Temp src Pulse Resp SpO2  09/07/17 2121 (!) 171/94 - - 77 20 97 %  09/07/17 1917 (!) 173/89 97.7 F (36.5 C) Oral 70 16 100 %  09/07/17 1706 (!) 143/83 - - 98 17 99 %  09/07/17 1502 132/62 - - 62 20 99 %    9:41 PM Reevaluation with update and discussion. After initial assessment and treatment, an updated evaluation reveals patient is comfortable, calm, and conversant.  Findings discussed with patient and daughter, all questions answered. Daleen Bo    Final Clinical Impressions(s) / ED Diagnoses   Final diagnoses:  Peripheral edema  Pleural effusion    Peripheral edema with abrasions.  Pleural effusions of nonspecific nature, marginally worse than 5 days ago on imaging.  No evidence for acute congestive heart failure.  Nursing Notes Reviewed/ Care Coordinated Applicable Imaging Reviewed Interpretation of Laboratory Data incorporated into ED treatment  The patient appears reasonably screened and/or stabilized for discharge and I doubt any other medical condition or other [facility] requiring further screening, evaluation, or treatment in the ED at this time prior to  discharge.  Plan: Home Medications-continue current medications; Home Treatments-elevate feet during the day, use compression stockings as much as possible; return here if the recommended treatment, does not improve the symptoms; Recommended follow up-PCP follow-up 1 week and as needed.  Home health services evaluation treatment ordered.   ED Discharge Orders        [place]     09/07/17 2156    Face-to-face encounter (required for Medicare/Medicaid patients)    Comments:  I Daleen Bo certify that this patient is under my care and that I, or a nurse practitioner or physician's assistant working with me, had a face-to-face encounter that meets the physician face-to-face encounter requirements with this patient on 09/07/2017. The encounter with the patient was in whole, or in part for the following medical condition(s) which is the primary reason for home health care (List medical condition): peripheral edema, weakness   09/07/17 2156       Daleen Bo, MD 09/07/17 2158

## 2017-09-07 NOTE — Telephone Encounter (Signed)
I am not managing this, please contact PCP

## 2017-09-08 ENCOUNTER — Telehealth: Payer: Self-pay | Admitting: Cardiovascular Disease

## 2017-09-08 NOTE — Telephone Encounter (Signed)
New Message   Pt c/o swelling: STAT is pt has developed SOB within 24 hours  1) How much weight have you gained and in what time span? Have not weighed her  2) If swelling, where is the swelling located? legs  3) Are you currently taking a fluid pill? yes  4) Are you currently SOB? yes  5) Do you have a log of your daily weights (if so, list)? no  6) Have you gained 3 pounds in a day or 5 pounds in a week? Not sure  7) Have you traveled recently? no Pt daughter verbalized that the pt has been in the er twice and she has edema in legs, leaking and in lungs

## 2017-09-08 NOTE — Telephone Encounter (Signed)
Spoke with daughter, Arbie Cookey, [place] on file.  Daughter states pt has been seen in ER a couple of times recently d/t bilateral leg edema.  First trip was 3/5 and they gave ABT and tx pt for cellulitis.  Daughter states pt's legs were weeping but no redness and no heat.  Daughter was called again yesterday and told pt was being transferred back to hospital d/t swelling worsening.  No weights available.  CXR last week was fine but the one from yesterday showed moderate pleural effusion.  Pt currently taking HCTZ 12.5mg  QD.  Daughter says pt denies SOB but she can see pt get winded at times.  Scheduled pt to see Dr. Acie Fredrickson 3/14 at [facility] I would send information to Dr. Acie Fredrickson for review and we would call back if he had recommendations or if he wanted pt seen sooner, otherwise we would see them on Thurs.

## 2017-09-09 NOTE — Telephone Encounter (Signed)
I notified Erin Charles)

## 2017-09-10 ENCOUNTER — Emergency Department (HOSPITAL_COMMUNITY): Payer: Medicare Other

## 2017-09-10 ENCOUNTER — Inpatient Hospital Stay (HOSPITAL_COMMUNITY)
Admission: EM | Admit: 2017-09-10 | Discharge: 2017-09-14 | DRG: 291 | Disposition: A | Discharge status: Hospice | Payer: Medicare Other | Attending: Internal Medicine | Admitting: Internal Medicine

## 2017-09-10 ENCOUNTER — Other Ambulatory Visit: Payer: Self-pay

## 2017-09-10 ENCOUNTER — Encounter (HOSPITAL_COMMUNITY): Payer: Self-pay | Admitting: Emergency Medicine

## 2017-09-10 DIAGNOSIS — I2510 Atherosclerotic heart disease of native coronary artery without angina pectoris: Secondary | ICD-10-CM | POA: Diagnosis present

## 2017-09-10 DIAGNOSIS — I130 Hypertensive heart and chronic kidney disease with heart failure and stage 1 through stage 4 chronic kidney disease, or unspecified chronic kidney disease: Secondary | ICD-10-CM | POA: Diagnosis present

## 2017-09-10 DIAGNOSIS — I4891 Unspecified atrial fibrillation: Secondary | ICD-10-CM | POA: Diagnosis present

## 2017-09-10 DIAGNOSIS — Z66 Do not resuscitate: Secondary | ICD-10-CM | POA: Diagnosis present

## 2017-09-10 DIAGNOSIS — I272 Pulmonary hypertension, unspecified: Secondary | ICD-10-CM | POA: Diagnosis present

## 2017-09-10 DIAGNOSIS — I5082 Biventricular heart failure: Secondary | ICD-10-CM | POA: Diagnosis present

## 2017-09-10 DIAGNOSIS — I350 Nonrheumatic aortic (valve) stenosis: Secondary | ICD-10-CM | POA: Diagnosis not present

## 2017-09-10 DIAGNOSIS — Z7189 Other specified counseling: Secondary | ICD-10-CM

## 2017-09-10 DIAGNOSIS — I10 Essential (primary) hypertension: Secondary | ICD-10-CM | POA: Diagnosis present

## 2017-09-10 DIAGNOSIS — I471 Supraventricular tachycardia: Secondary | ICD-10-CM | POA: Diagnosis present

## 2017-09-10 DIAGNOSIS — I509 Heart failure, unspecified: Secondary | ICD-10-CM

## 2017-09-10 DIAGNOSIS — Z7901 Long term (current) use of anticoagulants: Secondary | ICD-10-CM | POA: Diagnosis not present

## 2017-09-10 DIAGNOSIS — Z950 Presence of cardiac pacemaker: Secondary | ICD-10-CM | POA: Diagnosis present

## 2017-09-10 DIAGNOSIS — E782 Mixed hyperlipidemia: Secondary | ICD-10-CM | POA: Diagnosis present

## 2017-09-10 DIAGNOSIS — I5043 Acute on chronic combined systolic (congestive) and diastolic (congestive) heart failure: Secondary | ICD-10-CM | POA: Diagnosis present

## 2017-09-10 DIAGNOSIS — I480 Paroxysmal atrial fibrillation: Secondary | ICD-10-CM | POA: Diagnosis not present

## 2017-09-10 DIAGNOSIS — H3530 Unspecified macular degeneration: Secondary | ICD-10-CM | POA: Diagnosis present

## 2017-09-10 DIAGNOSIS — R7989 Other specified abnormal findings of blood chemistry: Secondary | ICD-10-CM | POA: Diagnosis present

## 2017-09-10 DIAGNOSIS — R778 Other specified abnormalities of plasma proteins: Secondary | ICD-10-CM | POA: Diagnosis present

## 2017-09-10 DIAGNOSIS — D0512 Intraductal carcinoma in situ of left breast: Secondary | ICD-10-CM | POA: Diagnosis present

## 2017-09-10 DIAGNOSIS — Z515 Encounter for palliative care: Secondary | ICD-10-CM | POA: Diagnosis present

## 2017-09-10 DIAGNOSIS — L03119 Cellulitis of unspecified part of limb: Secondary | ICD-10-CM | POA: Diagnosis not present

## 2017-09-10 DIAGNOSIS — F0390 Unspecified dementia without behavioral disturbance: Secondary | ICD-10-CM | POA: Diagnosis present

## 2017-09-10 DIAGNOSIS — I340 Nonrheumatic mitral (valve) insufficiency: Secondary | ICD-10-CM | POA: Diagnosis not present

## 2017-09-10 DIAGNOSIS — Z79811 Long term (current) use of aromatase inhibitors: Secondary | ICD-10-CM | POA: Diagnosis not present

## 2017-09-10 DIAGNOSIS — Z8052 Family history of malignant neoplasm of bladder: Secondary | ICD-10-CM | POA: Diagnosis not present

## 2017-09-10 DIAGNOSIS — I482 Chronic atrial fibrillation: Secondary | ICD-10-CM | POA: Diagnosis present

## 2017-09-10 DIAGNOSIS — G309 Alzheimer's disease, unspecified: Secondary | ICD-10-CM | POA: Diagnosis not present

## 2017-09-10 DIAGNOSIS — I5033 Acute on chronic diastolic (congestive) heart failure: Secondary | ICD-10-CM | POA: Diagnosis present

## 2017-09-10 DIAGNOSIS — I080 Rheumatic disorders of both mitral and aortic valves: Secondary | ICD-10-CM | POA: Diagnosis present

## 2017-09-10 DIAGNOSIS — F0280 Dementia in other diseases classified elsewhere without behavioral disturbance: Secondary | ICD-10-CM | POA: Diagnosis not present

## 2017-09-10 DIAGNOSIS — E876 Hypokalemia: Secondary | ICD-10-CM | POA: Diagnosis not present

## 2017-09-10 DIAGNOSIS — Z8249 Family history of ischemic heart disease and other diseases of the circulatory system: Secondary | ICD-10-CM | POA: Diagnosis not present

## 2017-09-10 DIAGNOSIS — I495 Sick sinus syndrome: Secondary | ICD-10-CM | POA: Diagnosis not present

## 2017-09-10 DIAGNOSIS — I442 Atrioventricular block, complete: Secondary | ICD-10-CM | POA: Diagnosis present

## 2017-09-10 DIAGNOSIS — Z993 Dependence on wheelchair: Secondary | ICD-10-CM | POA: Diagnosis not present

## 2017-09-10 DIAGNOSIS — N183 Chronic kidney disease, stage 3 (moderate): Secondary | ICD-10-CM | POA: Diagnosis present

## 2017-09-10 HISTORY — DX: Unspecified dementia without behavioral disturbance: F03.90

## 2017-09-10 HISTORY — DX: Unspecified dementia, mild, without behavioral disturbance, psychotic disturbance, mood disturbance, and anxiety: F03.A0

## 2017-09-10 HISTORY — DX: Chronic kidney disease, stage 3 unspecified: N18.30

## 2017-09-10 HISTORY — DX: Chronic combined systolic (congestive) and diastolic (congestive) heart failure: I50.42

## 2017-09-10 HISTORY — DX: Chronic kidney disease, stage 3 (moderate): N18.3

## 2017-09-10 LAB — HEPATIC FUNCTION PANEL
ALBUMIN: 3.2 g/dL — AB (ref 3.5–5.0)
ALK PHOS: 108 U/L (ref 38–126)
ALT: 37 U/L (ref 14–54)
AST: 37 U/L (ref 15–41)
Bilirubin, Direct: 0.3 mg/dL (ref 0.1–0.5)
Indirect Bilirubin: 0.5 mg/dL (ref 0.3–0.9)
TOTAL PROTEIN: 5.2 g/dL — AB (ref 6.5–8.1)
Total Bilirubin: 0.8 mg/dL (ref 0.3–1.2)

## 2017-09-10 LAB — BASIC METABOLIC PANEL
ANION GAP: 11 (ref 5–15)
BUN: 38 mg/dL — ABNORMAL HIGH (ref 6–20)
CHLORIDE: 101 mmol/L (ref 101–111)
CO2: 23 mmol/L (ref 22–32)
Calcium: 9.3 mg/dL (ref 8.9–10.3)
Creatinine, Ser: 1.12 mg/dL — ABNORMAL HIGH (ref 0.44–1.00)
GFR calc Af Amer: 48 mL/min — ABNORMAL LOW (ref >60)
GFR, EST NON AFRICAN AMERICAN: 41 mL/min — AB (ref >60)
GLUCOSE: 127 mg/dL — AB (ref 65–99)
POTASSIUM: 3.9 mmol/L (ref 3.5–5.1)
Sodium: 135 mmol/L (ref 135–145)

## 2017-09-10 LAB — TSH: TSH: 1.312 u[IU]/mL (ref 0.350–4.500)

## 2017-09-10 LAB — CBC
HEMATOCRIT: 42.9 % (ref 36.0–46.0)
HEMOGLOBIN: 13.7 g/dL (ref 12.0–15.0)
MCH: 26.3 pg (ref 26.0–34.0)
MCHC: 31.9 g/dL (ref 30.0–36.0)
MCV: 82.5 fL (ref 78.0–100.0)
Platelets: 255 10*3/uL (ref 150–400)
RBC: 5.2 MIL/uL — ABNORMAL HIGH (ref 3.87–5.11)
RDW: 16.2 % — ABNORMAL HIGH (ref 11.5–15.5)
WBC: 7.4 10*3/uL (ref 4.0–10.5)

## 2017-09-10 LAB — BRAIN NATRIURETIC PEPTIDE: B Natriuretic Peptide: >4500 pg/mL — ABNORMAL HIGH (ref 0.0–100.0)

## 2017-09-10 LAB — TROPONIN I: Troponin I: 0.04 ng/mL (ref <0.03)

## 2017-09-10 LAB — I-STAT TROPONIN, ED: Troponin i, poc: 0.03 ng/mL (ref 0.00–0.08)

## 2017-09-10 MED ORDER — ACETAMINOPHEN 500 MG PO TABS
500.0000 mg | ORAL_TABLET | Freq: Once | ORAL | Status: AC
  Administered 2017-09-10: 500 mg via ORAL
  Filled 2017-09-10: qty 1

## 2017-09-10 MED ORDER — ALPRAZOLAM 0.25 MG PO TABS
0.2500 mg | ORAL_TABLET | Freq: Three times a day (TID) | ORAL | Status: DC | PRN
  Administered 2017-09-11 – 2017-09-13 (×4): 0.25 mg via ORAL
  Filled 2017-09-10 (×4): qty 1

## 2017-09-10 MED ORDER — ANASTROZOLE 1 MG PO TABS
1.0000 mg | ORAL_TABLET | Freq: Every day | ORAL | Status: DC
  Administered 2017-09-11 – 2017-09-14 (×4): 1 mg via ORAL
  Filled 2017-09-10 (×5): qty 1

## 2017-09-10 MED ORDER — SODIUM CHLORIDE 0.9% FLUSH: 3.0000 mL | INTRAVENOUS | Status: DC | PRN

## 2017-09-10 MED ORDER — ZOLPIDEM TARTRATE 5 MG PO TABS
5.0000 mg | ORAL_TABLET | Freq: Every evening | ORAL | Status: DC | PRN
  Administered 2017-09-11: 5 mg via ORAL
  Filled 2017-09-10: qty 1

## 2017-09-10 MED ORDER — ONDANSETRON HCL 4 MG PO TABS: 4.0000 mg | ORAL_TABLET | Freq: Four times a day (QID) | ORAL | Status: DC | PRN

## 2017-09-10 MED ORDER — ACETAMINOPHEN 325 MG PO TABS
650.0000 mg | ORAL_TABLET | Freq: Four times a day (QID) | ORAL | Status: DC | PRN
  Administered 2017-09-12 – 2017-09-13 (×3): 650 mg via ORAL
  Filled 2017-09-10 (×3): qty 2

## 2017-09-10 MED ORDER — ACETAMINOPHEN 650 MG RE SUPP
650.0000 mg | Freq: Four times a day (QID) | RECTAL | Status: DC | PRN
  Filled 2017-09-10: qty 1

## 2017-09-10 MED ORDER — QUINAPRIL HCL 10 MG PO TABS
40.0000 mg | ORAL_TABLET | Freq: Two times a day (BID) | ORAL | Status: DC
  Administered 2017-09-11 – 2017-09-14 (×8): 40 mg via ORAL
  Filled 2017-09-10 (×10): qty 4

## 2017-09-10 MED ORDER — ONDANSETRON HCL 4 MG/2ML IJ SOLN: 4.0000 mg | Freq: Four times a day (QID) | INTRAMUSCULAR | Status: DC | PRN

## 2017-09-10 MED ORDER — HYDROCODONE-ACETAMINOPHEN 5-325 MG PO TABS
1.0000 | ORAL_TABLET | ORAL | Status: DC | PRN
  Administered 2017-09-12: 2 via ORAL
  Administered 2017-09-13: 1 via ORAL
  Filled 2017-09-10: qty 2
  Filled 2017-09-10: qty 1

## 2017-09-10 MED ORDER — FUROSEMIDE 10 MG/ML IJ SOLN: 40.0000 mg | Freq: Two times a day (BID) | INTRAMUSCULAR | Status: DC

## 2017-09-10 MED ORDER — SODIUM CHLORIDE 0.9% FLUSH
3.0000 mL | Freq: Two times a day (BID) | INTRAVENOUS | Status: DC
  Administered 2017-09-11 – 2017-09-14 (×8): 3 mL via INTRAVENOUS

## 2017-09-10 MED ORDER — SODIUM CHLORIDE 0.9 % IV SOLN: 250.0000 mL | INTRAVENOUS | Status: DC | PRN

## 2017-09-10 MED ORDER — METOPROLOL SUCCINATE ER 50 MG PO TB24
50.0000 mg | ORAL_TABLET | Freq: Every day | ORAL | Status: DC
  Administered 2017-09-11 – 2017-09-14 (×4): 50 mg via ORAL
  Filled 2017-09-10 (×4): qty 1

## 2017-09-10 MED ORDER — SIMVASTATIN 40 MG PO TABS
40.0000 mg | ORAL_TABLET | Freq: Every day | ORAL | Status: DC
  Administered 2017-09-11 – 2017-09-13 (×4): 40 mg via ORAL
  Filled 2017-09-10 (×4): qty 1

## 2017-09-10 MED ORDER — APIXABAN 2.5 MG PO TABS
2.5000 mg | ORAL_TABLET | Freq: Two times a day (BID) | ORAL | Status: DC
  Administered 2017-09-11 – 2017-09-14 (×8): 2.5 mg via ORAL
  Filled 2017-09-10 (×8): qty 1

## 2017-09-10 MED ORDER — FUROSEMIDE 10 MG/ML IJ SOLN
40.0000 mg | Freq: Once | INTRAMUSCULAR | Status: AC
  Administered 2017-09-10: 40 mg via INTRAVENOUS
  Filled 2017-09-10: qty 4

## 2017-09-10 MED ORDER — TRAMADOL HCL 50 MG PO TABS
50.0000 mg | ORAL_TABLET | Freq: Four times a day (QID) | ORAL | Status: DC | PRN
  Administered 2017-09-13: 50 mg via ORAL
  Filled 2017-09-10: qty 1

## 2017-09-10 NOTE — Telephone Encounter (Signed)
I suspect this is due to her progressive aortic stenosis. Lets have her try Lasix 40 mg a day . Continue Kdur ( or start [place] 10 a day )  Will see her tomorrow as scheduled

## 2017-09-10 NOTE — Telephone Encounter (Signed)
Spoke with patient's daughter, Arbie Cookey, regarding patient's condition. She states she is currently at work but is planning to leave work soon to take her mom to the ER as advised by the nurse at [facility]. She asks if I can call the nurse to discuss. I spoke with Earnest Bailey, the patient's nurse at [facility], who states patient was seen by home health this morning for wound care for her legs. She states the home health nurse thought the patient's legs were worse and that she should be taken to the ER. Earnest Bailey states that her personal concern is that the patient seems more lethargic than normal; she states the patient's legs did seem darker in color than before but patient did not have her legs elevated like she should have. I thanked Earnest Bailey for her help and spoke with Dr. Acie Fredrickson who advised that if patient is lethargic, she should probably go to [facility] ED for IV diuretic rather than oral diuretic. I reviewed his advice with Arbie Cookey, patient's daughter and she verbalized understanding and agreement. I advised Arbie Cookey that I will leave the patient's appointment for tomorrow with Dr. Acie Fredrickson in the event that they get discharged today or tonight and still need the appointment. She thanked me and Dr. Acie Fredrickson for our help.

## 2017-09-10 NOTE — ED Provider Notes (Signed)
Patient placed in Quick Look pathway, seen and evaluated   Chief Complaint: Lower extremity edema, shortness of breath  HPI: Patient brought in by daughter with complaint of bilateral lower extremity edema, right worse than left which is been ongoing but worsening over the past several days.  She also notes dyspnea especially with deep breaths and while talking.  No orthopnea or PND.  Legs have been weeping serous fluid.  Extremities are cool to the touch which daughter states is chronic and unchanged for her.  She was recently admitted for similar complaints and treated with doxycycline for presumed infection.  No chest pain or fevers.  ROS: Positive for leg swelling, shortness of breath, negative for fevers, chest pain, orthopnea, PND  Physical Exam:   Gen: No distress  Neuro: Awake and Alert  Skin: Warm    Focused Exam: 2+ nonpitting edema of the bilateral lower extremities, right worse than left.  There are sores weeping serous fluid.  Irregularly irregular rhythm with whooshing systolic murmur noted.  Lungs clear to auscultation bilaterally.  1+ DP/PT pulses bilaterally.   Initiation of care has begun. The patient has been counseled on the process, plan, and necessity for staying for the completion/evaluation, and the remainder of the medical screening examination    Debroah Baller 09/10/17 1615    Isla Pence, MD 09/10/17 [phone]

## 2017-09-10 NOTE — ED Notes (Signed)
Dr.Doutova paged to notify of elevated trop of 0.04; Charge RN notified and ok' ed to still transport to [facility]; Pt has no c/o pain at this time-Monique,RN

## 2017-09-10 NOTE — ED Provider Notes (Signed)
[facility] DEPART[facility] Provider Note   CSN: [id number] Arrival date & time: 09/10/17  1538     History   Chief Complaint Chief Complaint  Patient presents with  . Shortness of Breath    HPI Erin Charles is a 84 y.o. female.  The history is provided by the patient, a relative and medical records. No language interpreter was used.  Shortness of Breath  This is a recurrent problem. The average episode lasts 1 week. The problem occurs continuously.The current episode started yesterday. The problem has been gradually worsening. Associated symptoms include cough and leg swelling. Pertinent negatives include no fever, no headaches, no rhinorrhea, no neck pain, no sputum production, no wheezing, no chest pain, no syncope, no vomiting, no abdominal pain and no rash. She has tried nothing for the symptoms. The treatment provided no relief. She has had prior hospitalizations. Associated medical issues include heart failure.    Past Medical History:  Diagnosis Date  . Advanced age   . Atrial flutter ([place])   . Chronic pain    a. followed in pain mgmt.  . Complete heart block ([place])    Pacer  . 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 Dyslipidemia   . HTN (hypertension)   . Macular degeneration   . Mild CAD    a. LHC 08/2014: 40% D1, 30% LM.  . OA (osteoarthritis)   . PAF (paroxysmal atrial fibrillation) ([place])   . RBBB (right bundle branch block)   . Severe aortic stenosis    a. pt has declined TAVR.    Patient Active Problem List   Diagnosis Date Noted  . Acute renal insufficiency 09/03/2017  . Cellulitis 09/02/2017  . Urinary retention   . Physical deconditioning   . Dementia without behavioral disturbance   . Anxiety   . Hypokalemia   . Hyponatremia 04/28/2017  . Back pain 04/28/2017  . Elevated troponin 04/28/2017  . Mixed hyperlipidemia 08/13/2016  . Ductal carcinoma in situ (DCIS) of left breast 07/21/2015  .  Traumatic ecchymosis of groin 08/18/2014  . Trochanteric bursitis of right hip 03/03/2014  . Osteoarthritis of hip 06/07/2013  . DJD (degenerative joint disease) of knee 06/07/2013  . Encounter for long-term (current) use of other medications 06/07/2013  . Spinal stenosis, lumbar region, without neurogenic claudication 06/07/2013  . Atrial fibrillation ([place]) 03/30/2013  . Atrioventricular block, complete ([place]) 12/12/2011  . Unstable angina ([place]) 08/04/2011  . Hypertension 09/27/2010  . Pacemaker-St.Jude 09/27/2010  . Aortic stenosis 09/27/2010    Past Surgical History:  Procedure Laterality Date  . EYE SURGERY    . HERNIA REPAIR    . INSERT / REPLACE / REMOVE PACEMAKER  08/30/10   DUAL CHAMBER/ST. JUDE  . LEFT AND RIGHT HEART CATHETERIZATION WITH CORONARY ANGIOGRAM N/A 08/09/2014   Procedure: LEFT AND RIGHT HEART CATHETERIZATION WITH CORONARY ANGIOGRAM;  Surgeon: Burnell Blanks, MD;  Location: [facility] CATH LAB;  Service: Cardiovascular;  Laterality: N/A;  . PACEMAKER INSERTION    . PILONIDAL CYST EXCISION      OB History    No data available       Home Medications    Prior to Admission medications   Medication Sig Start Date End Date Taking? Authorizing Provider  acetaminophen (TYLENOL) 325 MG tablet Take 650 mg by mouth every 8 (eight) hours as needed for mild pain.    Yes [provider]  ALPRAZolam (XANAX) 0.25 MG tablet Take 1 tablet (0.25 mg  total) by mouth every 8 (eight) hours as needed for anxiety. 09/04/17  Yes Roxan Hockey, MD  anastrozole (ARIMIDEX) 1 MG tablet Take 1 tablet (1 mg total) daily by mouth. 05/13/17  Yes Truitt Merle, MD  doxycycline (VIBRA-TABS) 100 MG tablet Take 1 tablet (100 mg total) by mouth 2 (two) times daily. 09/04/17  Yes Emokpae, Courage, MD  ELIQUIS 2.5 MG TABS tablet TAKE 1 TABLET BY MOUTH TWICE A DAY 02/24/17  Yes Nahser, Wonda Cheng, MD  hydrochlorothiazide (HYDRODIURIL) 12.5 MG tablet Take 1 tablet (12.5 mg total) by mouth daily.  09/04/17  Yes Roxan Hockey, MD  lactobacillus acidophilus & bulgar (LACTINEX) chewable tablet Chew 1 tablet by mouth 3 (three) times daily with meals. 09/04/17  Yes Emokpae, Courage, MD  metoprolol succinate (TOPROL-XL) 50 MG 24 hr tablet TAKE 1 TABLET BY MOUTH DAILY 01/20/17  Yes Nahser, Wonda Cheng, MD  NITROSTAT 0.4 MG SL tablet PLACE 1 TABLET (0.4 MG TOTAL) UNDER THE TONGUE EVERY 5 (FIVE) MINUTES AS NEEDED FOR CHEST PAIN. 04/29/16  Yes Nahser, Wonda Cheng, MD  potassium chloride (K-DUR,KLOR-CON) 10 MEQ tablet Take 10 mEq by mouth daily.    Yes [provider]  quinapril (ACCUPRIL) 40 MG tablet Take 40 mg by mouth 2 (two) times daily.    Yes [provider]  simvastatin (ZOCOR) 40 MG tablet Take 40 mg by mouth at bedtime.    Yes Nahser, Wonda Cheng, MD  traMADol (ULTRAM) 50 MG tablet Take 1 tablet (50 mg total) by mouth every 8 (eight) hours as needed for moderate pain or severe pain (not relieved by tylenol #3). Patient taking differently: Take 50 mg by mouth every 6 (six) hours as needed for moderate pain.  05/02/17  Yes Barton Dubois, MD  zolpidem (AMBIEN) 10 MG tablet Take 10 mg by mouth at bedtime as needed for sleep.  09/15/13  Yes [provider]    Family History Family History  Problem Relation Age of Onset  . Cancer Mother        Bladder cancer  . Heart failure Father   . Heart attack Sister     Social History Social History   Tobacco Use  . Smoking status: Never Smoker  . Smokeless tobacco: Never Used  Substance Use Topics  . Alcohol use: No  . Drug use: No     Allergies   Macrodantin; Penicillins; and Tolectin [tolmetin sodium]   Review of Systems Review of Systems  Constitutional: Positive for fatigue. Negative for chills, diaphoresis and fever.  HENT: Negative for congestion and rhinorrhea.   Respiratory: Positive for cough and shortness of breath. Negative for sputum production, chest tightness, wheezing and stridor.   Cardiovascular:  Positive for leg swelling. Negative for chest pain, palpitations and syncope.  Gastrointestinal: Negative for abdominal pain, constipation, diarrhea, nausea and vomiting.  Genitourinary: Negative for dysuria.  Musculoskeletal: Negative for back pain, neck pain and neck stiffness.  Skin: Negative for rash and wound.  Neurological: Negative for light-headedness and headaches.  Psychiatric/Behavioral: Negative for agitation.  All other systems reviewed and are negative.    Physical Exam Updated Vital Signs BP 134/73 (BP Location: Right Arm)   Pulse 70   Temp (!) 97.3 F (36.3 C) (Oral)   Resp 18   Ht 4\' 11"  (1.499 m)   Wt 49.2 kg (108 lb 6.4 oz)   SpO2 98%   BMI 21.89 kg/m   Physical Exam  Constitutional: She appears well-developed and well-nourished. No distress.  HENT:  Head:  Normocephalic.  Mouth/Throat: Oropharynx is clear and moist. No oropharyngeal exudate.  Eyes: Conjunctivae and EOM are normal. Pupils are equal, round, and reactive to light.  Neck: Normal range of motion. Neck supple.  Cardiovascular: Normal rate and intact distal pulses.  Murmur heard. Pulmonary/Chest: Effort normal. No respiratory distress. She has no wheezes. She has rales. She exhibits no tenderness.  Abdominal: Soft. Bowel sounds are normal. She exhibits no distension. There is no tenderness. No hernia.  Musculoskeletal: She exhibits edema.  Neurological: No sensory deficit. She exhibits normal muscle tone.  Skin: Capillary refill takes less than 2 seconds. No rash noted. She is not diaphoretic. There is erythema.  Psychiatric: She has a normal mood and affect.  Nursing note and vitals reviewed.    ED Treatments / Results  Labs (all labs ordered are listed, but only abnormal results are displayed) Labs Reviewed  BASIC METABOLIC PANEL - Abnormal; Notable for the following components:      Result Value   Glucose, Bld 127 (*)    BUN 38 (*)    Creatinine, Ser 1.12 (*)    GFR calc non Af Amer  41 (*)    GFR calc Af Amer 48 (*)    All other components within normal limits  CBC - Abnormal; Notable for the following components:   RBC 5.20 (*)    RDW 16.2 (*)    All other components within normal limits  BRAIN NATRIURETIC PEPTIDE - Abnormal; Notable for the following components:   B Natriuretic Peptide >4,500.0 (*)    All other components within normal limits  TROPONIN I - Abnormal; Notable for the following components:   Troponin I 0.04 (*)    All other components within normal limits  HEPATIC FUNCTION PANEL - Abnormal; Notable for the following components:   Total Protein 5.2 (*)    Albumin 3.2 (*)    All other components within normal limits  TSH  TROPONIN I  TROPONIN I  MAGNESIUM  PHOSPHORUS  COMPREHENSIVE METABOLIC PANEL  CBC  I-STAT TROPONIN, ED    EKG  EKG Interpretation  Date/Time:  Wednesday September 10 2017 15:54:09 EDT Ventricular Rate:  76 PR Interval:    QRS Duration: 166 QT Interval:  458 QTC Calculation: 515 R Axis:   -74 Text Interpretation:  Ventricular-paced rhythm Abnormal ECG Paced rhythm.  No STEMI Confirmed by Antony Blackbird [phone]) on 09/10/2017 6:49:54 PM       Radiology Dg Chest 2 View  Result Date: 09/10/2017 CLINICAL DATA:  84 y/o F; shortness of breath with fluid leaking from the legs for 1 week. EXAM: CHEST - 2 VIEW COMPARISON:  09/07/2017 chest radiograph FINDINGS: Stable cardiomegaly given projection and technique. Two lead pacemaker. Aortic atherosclerosis with calcification. Stable small to moderate bilateral pleural effusions. Mild interstitial edema. Basilar opacities probably represent associated atelectasis. No pneumothorax. No acute osseous abnormality is evident. IMPRESSION: Stable small to moderate pleural effusions and basilar atelectasis. Mild interstitial edema. Electronically Signed   By: Kristine Garbe M.D.   On: 09/10/2017 17:43    Procedures Procedures (including critical care time)  Medications Ordered in  ED Medications  furosemide (LASIX) injection 40 mg (not administered)  ALPRAZolam (XANAX) tablet 0.25 mg (not administered)  anastrozole (ARIMIDEX) tablet 1 mg (not administered)  apixaban (ELIQUIS) tablet 2.5 mg (not administered)  metoprolol succinate (TOPROL-XL) 24 hr tablet 50 mg (not administered)  quinapril (ACCUPRIL) tablet 40 mg (not administered)  simvastatin (ZOCOR) tablet 40 mg (not administered)  traMADol (ULTRAM) tablet 50  mg (not administered)  zolpidem (AMBIEN) tablet 5 mg (not administered)  acetaminophen (TYLENOL) tablet 650 mg (not administered)    Or  acetaminophen (TYLENOL) suppository 650 mg (not administered)  HYDROcodone-acetaminophen (NORCO/VICODIN) 5-325 MG per tablet 1-2 tablet (not administered)  ondansetron (ZOFRAN) tablet 4 mg (not administered)    Or  ondansetron (ZOFRAN) injection 4 mg (not administered)  sodium chloride flush (NS) 0.9 % injection 3 mL (not administered)  sodium chloride flush (NS) 0.9 % injection 3 mL (not administered)  0.9 %  sodium chloride infusion (not administered)  acetaminophen (TYLENOL) tablet 500 mg (500 mg Oral Given 09/10/17 2020)  furosemide (LASIX) injection 40 mg (40 mg Intravenous Given 09/10/17 2319)     Initial Impression / Assessment and Plan / ED Course  I have reviewed the triage vital signs and the nursing notes.  Pertinent labs & imaging results that were available during my care of the patient were reviewed by me and considered in my medical decision making (see chart for details).     ASHYA NICOLAISEN is a 84 y.o. female with a past medical history significant for CHF, aortic stenosis, pacemaker dependence, hypertension, dyslipidemia, and currently on antibiotics for bilateral lower extremity cellulitis who presents with worsening shortness of breath and bilateral lower extremity edema.  Family reports that for the last week, patient has worsening swelling of her legs and is now having exertional shortness of  breath  Patient reports that she called Dr. Katharina Caper, her cardiologist who initially felt increasing oral Lasix and follow-up tomorrow would be appropriate however, after the facility reassessed the patient, she was more short of breath in the center to the ED for evaluation.  Patient denies any chest pain, palpitations, lightheadedness, fevers, or chills.  She denies any urinary symptoms, GI symptoms, or other complaints.  She reports that she does take Eliquis.  She denies any trauma.  On exam, patient has crackles in the bases of her lungs.  Patient's legs were very cold and had some mottling on the knees but she did have palpable pulses in her dorsalis pedis arteries bilaterally.  Patient had significant pitting edema in both legs going up towards her knees.  Patient reports that she had lower extremely Dopplers which did not show blood clot.  Patient had diagnostic lab testing in triage which discovered elevated BNP of greater than 4500.  Patient had this checked several days ago when the decision was made to increase her Lasix and it was still greater than 4500. Initial troponin negative.  Creatinine slightly elevated but CBC reassuring.  No anemia.  Chest x-ray showed evidence of edema and pleural effusions.  Due to patient's shortness of breath and the severely elevated BNP with the worsening edema, I am concerned patient is having worsening heart failure.  Suspect patient will need admission for echocardiogram to further evaluate her aortic stenosis to make sure has not reached a critical value.  Hospitalist team will be called for admission for diuresis and further management of CHF exacerbation.    Final Clinical Impressions(s) / ED Diagnoses   Final diagnoses:  Acute on chronic congestive heart failure, unspecified heart failure type (HCC)    Clinical Impression: 1. Acute on chronic congestive heart failure, unspecified heart failure type ([place])     Disposition: Admit  This note was  prepared with assistance of Dragon voice recognition software. Occasional wrong-word or sound-a-like substitutions may have occurred due to the inherent limitations of voice recognition software.      Darneisha Windhorst,  Gwenyth Allegra, MD 09/11/17 [phone]

## 2017-09-10 NOTE — H&P (Addendum)
Erin Charles TKP:[id number] DOB: Feb 07, 1923 DOA: 09/10/2017     PCP: Hulan Fess, MD   Outpatient Specialists: Cardiology Nahser, Oncology Fong Patient coming from:   From facility assisted [facility]    Chief Complaint: This of breath and leg edema  HPI: Erin Charles is a 84 y.o. female with medical history significant ofsevere aortic stenosis valve area mean of 0.51 cm on echo on 04/29/2017, paroxysmal atrial fibrillation/flutter complicated by complete heart block status post pacer placement on anticoagulation, dementia, hypertension, anxiety, hyperlipidemia    Presented with shortness of breath and leg edema  Last week patient was admitted for bilateral cellulitis initially treated with IV clindamycin but then transition to p.o. doxycycline patient was discharged on 7 March. Patient return to emergency department in the next few days secondary to persistent lower extremity edema and weeping.  Her dressings was changed and she was sent back to assisted living.  Chest x-ray did show worsening pleural effusions next day family called cardiology request an appointment which was scheduled for 14 March given the patient started to have   worsening shortness of breath.  Cardiology recommended starting her on Lasix 40 mg a day and felt that this likely secondary to progressive aortic stenosis. Patient have not had a dose yet  Said living nursing staff felt the patient was somewhat more lethargic at which point cardiology recommended for patient to come into the emergency department for IV diuresis  Fevers or chills leg continue to be weeping. Lower extremities are very cold to palpation  While in ER: Lasix has been ordered  Significant initial  Findings:  Troponin normal Na 135 K 3.9 Cr 1.12 WBC 7.4 Hg 13.7 PLT 255 BNP >4500  CXR mild to moderate pleural effusions   IN ER:  No data recorded.      on arrival  ED Triage Vitals  Enc Vitals  Group     BP 09/10/17 1830 (!) 149/60     Pulse Rate 09/10/17 1830 71     Resp 09/10/17 1900 (!) 23     Temp --      Temp src --      SpO2 09/10/17 1830 100 %     Weight --      Height --      Head Circumference --      Peak Flow --      Pain Score 09/10/17 1606 0     Pain Loc --      Pain Edu? --      Excl. in [place]? --     Latest 21 99% HR 85 BP 141/70  Following Medications were ordered in ER: Medications  acetaminophen (TYLENOL) tablet 500 mg (500 mg Oral Given 09/10/17 2020)      Hospitalist was called for admission for CHF exacerbation     Review of Systems:    Pertinent positives include:  shortness of breath at rest, Bilateral lower extremity swelling    Constitutional:  No weight loss, night sweats, Fevers, chills, fatigue, weight loss  HEENT:  No headaches, Difficulty swallowing,Tooth/dental problems,Sore throat,  No sneezing, itching, ear ache, nasal congestion, post nasal drip,  Cardio-vascular:  No chest pain, Orthopnea, PND, anasarca, dizziness, palpitations.no GI:  No heartburn, indigestion, abdominal pain, nausea, vomiting, diarrhea, change in bowel habits, loss of appetite, melena, blood in stool, hematemesis Resp:    No dyspnea on exertion, No excess mucus, no productive cough, No non-productive cough, No coughing up of blood.  No change in color of mucus.No wheezing. Skin:  no rash or lesions. No jaundice GU:  no dysuria, change in color of urine, no urgency or frequency. No straining to urinate.  No flank pain.  Musculoskeletal:  No joint pain or no joint swelling. No decreased range of motion. No back pain.  Psych:  No change in mood or affect. No depression or anxiety. No memory loss.  Neuro: no localizing neurological complaints, no tingling, no weakness, no double vision, no gait abnormality, no slurred speech, no confusion  As per HPI otherwise 10 point review of systems negative.   Past Medical History: Past Medical History:  Diagnosis  Date  . Advanced age   . Atrial flutter ([place])   . Chronic pain    a. followed in pain mgmt.  . Complete heart block ([place])    Pacer  . 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 Dyslipidemia   . HTN (hypertension)   . Macular degeneration   . Mild CAD    a. LHC 08/2014: 40% D1, 30% LM.  . OA (osteoarthritis)   . PAF (paroxysmal atrial fibrillation) ([place])   . RBBB (right bundle branch block)   . Severe aortic stenosis    a. pt has declined TAVR.   Past Surgical History:  Procedure Laterality Date  . EYE SURGERY    . HERNIA REPAIR    . INSERT / REPLACE / REMOVE PACEMAKER  08/30/10   DUAL CHAMBER/ST. JUDE  . LEFT AND RIGHT HEART CATHETERIZATION WITH CORONARY ANGIOGRAM N/A 08/09/2014   Procedure: LEFT AND RIGHT HEART CATHETERIZATION WITH CORONARY ANGIOGRAM;  Surgeon: Burnell Blanks, MD;  Location: [facility] CATH LAB;  Service: Cardiovascular;  Laterality: N/A;  . PACEMAKER INSERTION    . PILONIDAL CYST EXCISION       Social History:  Ambulatory   wheelchair bound,      reports that  has never smoked. she has never used smokeless tobacco. She reports that she does not drink alcohol or use drugs.  Allergies:   Allergies  Allergen Reactions  . Macrodantin Rash  . Penicillins Rash and Other (See Comments)    Many years ago. Has patient had a PCN reaction causing immediate rash, facial/tongue/throat swelling, SOB or lightheadedness with hypotension: unknown Has patient had a PCN reaction causing severe rash involving mucus membranes or skin necrosis: unknown Has patient had a PCN reaction that required hospitalization: unknown Has patient had a PCN reaction occurring within the last 10 years: No If all of the above answers are "NO", then may proceed with Cephalosporin use.   Marland Kitchen Tolectin [Tolmetin Sodium] Rash       Family History:  Family History  Problem Relation Age of Onset  . Cancer Mother        Bladder cancer  . Heart failure Father   . Heart  attack Sister     Medications: Prior to Admission medications   Medication Sig Start Date End Date Taking? Authorizing Provider  acetaminophen (TYLENOL) 325 MG tablet Take 650 mg by mouth every 8 (eight) hours as needed for mild pain.    Yes [provider]  ALPRAZolam (XANAX) 0.25 MG tablet Take 1 tablet (0.25 mg total) by mouth every 8 (eight) hours as needed for anxiety. 09/04/17  Yes Roxan Hockey, MD  anastrozole (ARIMIDEX) 1 MG tablet Take 1 tablet (1 mg total) daily by mouth. 05/13/17  Yes Truitt Merle, MD  doxycycline (VIBRA-TABS) 100 MG tablet Take 1 tablet (100  mg total) by mouth 2 (two) times daily. 09/04/17  Yes Emokpae, Courage, MD  ELIQUIS 2.5 MG TABS tablet TAKE 1 TABLET BY MOUTH TWICE A DAY 02/24/17  Yes Nahser, Wonda Cheng, MD  hydrochlorothiazide (HYDRODIURIL) 12.5 MG tablet Take 1 tablet (12.5 mg total) by mouth daily. 09/04/17  Yes Roxan Hockey, MD  lactobacillus acidophilus & bulgar (LACTINEX) chewable tablet Chew 1 tablet by mouth 3 (three) times daily with meals. 09/04/17  Yes Emokpae, Courage, MD  metoprolol succinate (TOPROL-XL) 50 MG 24 hr tablet TAKE 1 TABLET BY MOUTH DAILY 01/20/17  Yes Nahser, Wonda Cheng, MD  NITROSTAT 0.4 MG SL tablet PLACE 1 TABLET (0.4 MG TOTAL) UNDER THE TONGUE EVERY 5 (FIVE) MINUTES AS NEEDED FOR CHEST PAIN. 04/29/16  Yes Nahser, Wonda Cheng, MD  potassium chloride (K-DUR,KLOR-CON) 10 MEQ tablet Take 10 mEq by mouth daily.    Yes [provider]  quinapril (ACCUPRIL) 40 MG tablet Take 40 mg by mouth 2 (two) times daily.    Yes [provider]  simvastatin (ZOCOR) 40 MG tablet Take 40 mg by mouth at bedtime.    Yes Nahser, Wonda Cheng, MD  traMADol (ULTRAM) 50 MG tablet Take 1 tablet (50 mg total) by mouth every 8 (eight) hours as needed for moderate pain or severe pain (not relieved by tylenol #3). Patient taking differently: Take 50 mg by mouth every 6 (six) hours as needed for moderate pain.  05/02/17  Yes Barton Dubois, MD  zolpidem  (AMBIEN) 10 MG tablet Take 10 mg by mouth at bedtime as needed for sleep.  09/15/13  Yes [provider]    Physical Exam: Patient Vitals for the past 24 hrs:  BP Pulse Resp SpO2  09/10/17 2000 (!) 141/70 (!) 47 (!) 21 99 %  09/10/17 1900 - - (!) 23 -  09/10/17 1830 (!) 149/60 71 - 100 %    1. General:  in No Acute distress Well  -appearing 2. Psychological: Alert and  Oriented 3. Head/ENT:   Moist  Mucous Membranes                          Head Non traumatic, neck supple                         Poor Dentition 4. SKIN: normal   Skin turgor,  Skin clean Dry weeping ulcers on lower extremities bilaterally.    5. Heart: Regular rate and rhythm loud systolic murmur, no Rub or gallop 6. Lungs:   no wheezes or crackles   7. Abdomen: Soft,  non-tender, Non distended   8. Lower extremities: no clubbing, cyanosis, 1+ edema, cool to touch with pulses palpable, 2 sec cap refill 9. Neurologically Grossly intact, moving all 4 extremities equally   10. MSK: Normal range of motion   body mass index is unknown because there is no height or weight on file.  Labs on Admission:   Labs on Admission: I have personally reviewed following labs and imaging studies  CBC: Recent Labs  Lab 09/04/17 0407 09/07/17 1613 09/10/17 1549  WBC 7.3 7.9 7.4  NEUTROABS  --  5.9  --   HGB 12.2 14.2 13.7  HCT 39.3 43.9 42.9  MCV 86.8 82.8 82.5  PLT 229 254 710   Basic Metabolic Panel: Recent Labs  Lab 09/04/17 0407 09/07/17 1613 09/10/17 1549  NA 137 139 135  K 3.5 3.9 3.9  CL 102  102 101  CO2 25 27 23   GLUCOSE 124* 118* 127*  BUN 39* 44* 38*  CREATININE 1.11* 1.09* 1.12*  CALCIUM 8.7* 9.5 9.3   GFR: Estimated Creatinine Clearance: 22 mL/min (A) (by C-G formula based on SCr of 1.12 mg/dL (H)). Liver Function Tests: Recent Labs  Lab 09/04/17 0407 09/07/17 1613  AST 52* 39  ALT 77* 48  ALKPHOS 111 124  BILITOT 0.9 0.6  PROT 5.1* 5.6*  ALBUMIN 3.1* 3.5   No results for  input(s): LIPASE, AMYLASE in the last 168 hours. No results for input(s): AMMONIA in the last 168 hours. Coagulation Profile: No results for input(s): INR, PROTIME in the last 168 hours. Cardiac Enzymes: No results for input(s): CKTOTAL, CKMB, CKMBINDEX, TROPONINI in the last 168 hours. BNP (last 3 results) No results for input(s): PROBNP in the last 8760 hours. HbA1C: No results for input(s): HGBA1C in the last 72 hours. CBG: No results for input(s): GLUCAP in the last 168 hours. Lipid Profile: No results for input(s): CHOL, HDL, LDLCALC, TRIG, CHOLHDL, LDLDIRECT in the last 72 hours. Thyroid Function Tests: No results for input(s): TSH, T4TOTAL, FREET4, T3FREE, THYROIDAB in the last 72 hours. Anemia Panel: No results for input(s): VITAMINB12, FOLATE, FERRITIN, TIBC, IRON, RETICCTPCT in the last 72 hours. Urine analysis:    Component Value Date/Time   COLORURINE YELLOW 09/03/2017 1513   APPEARANCEUR CLEAR 09/03/2017 1513   LABSPEC 1.017 09/03/2017 1513   PHURINE 5.0 09/03/2017 1513   GLUCOSEU NEGATIVE 09/03/2017 1513   HGBUR NEGATIVE 09/03/2017 1513   BILIRUBINUR NEGATIVE 09/03/2017 1513   KETONESUR NEGATIVE 09/03/2017 1513   PROTEINUR 30 (A) 09/03/2017 1513   UROBILINOGEN 0.2 03/01/2015 0932   NITRITE NEGATIVE 09/03/2017 1513   LEUKOCYTESUR NEGATIVE 09/03/2017 1513   Sepsis Labs: @LABRCNTIP (procalcitonin:4,lacticidven:4) ) Recent Results (from the past 240 hour(s))  Culture, blood (routine x 2)     Status: None   Collection Time: 09/02/17  8:40 PM  Result Value Ref Range Status   Specimen Description   Final    BLOOD LEFT ANTECUBITAL Performed at [facility], [address] [place], [place] [address]    Special Requests   Final    BOTTLES DRAWN AEROBIC AND ANAEROBIC Blood Culture adequate volume Performed at [facility], [address] [place], [place] [address]    Culture   Final    NO GROWTH 5 DAYS Performed at [facility],  [place] [address], [place], [place] [address]    Report Status 09/07/2017 FINAL  Final  Culture, blood (routine x 2)     Status: None   Collection Time: 09/02/17  8:45 PM  Result Value Ref Range Status   Specimen Description   Final    BLOOD RIGHT ANTECUBITAL Performed at [facility], [address] [place], [place] [address]    Special Requests   Final    BOTTLES DRAWN AEROBIC AND ANAEROBIC Blood Culture adequate volume Performed at [facility], [address] [place], [place] [address]    Culture   Final    NO GROWTH 5 DAYS Performed at [facility], [place] [address] [place], [place] [address]    Report Status 09/07/2017 FINAL  Final  MRSA PCR Screening     Status: None   Collection Time: 09/03/17  3:13 PM  Result Value Ref Range Status   MRSA by PCR NEGATIVE NEGATIVE Final    Comment:  The GeneXpert MRSA Assay (FDA approved for NASAL specimens only), is one component of a comprehensive MRSA colonization surveillance program. It is not intended to diagnose MRSA infection nor to guide or monitor treatment for MRSA infections. Performed at [facility], [place] [address] [place], [place] [address]       UA not ordered  Lab Results  Component Value Date   HGBA1C  06/15/2009    6.1 (NOTE) The ADA recommends the following therapeutic goal for glycemic control related to Hgb A1c measurement: Goal of therapy: <6.5 Hgb A1c  Reference: American Diabetes Association: Clinical Practice Recommendations 2010, Diabetes Care, 2010, 33: (Suppl  1).    Estimated Creatinine Clearance: 22 mL/min (A) (by C-G formula based on SCr of 1.12 mg/dL (H)).  BNP (last 3 results) No results for input(s): PROBNP in the last 8760 hours.   ECG REPORT  Independently reviewed Rate: 76  Rhythm: Paced ST&T Change: Paced QTC 550  There were no vitals filed for this visit.   Cultures:    Component Value Date/Time   SDES  09/02/2017 2045     BLOOD RIGHT ANTECUBITAL Performed at [facility], [address] [place], [place] [address]    [facility]  09/02/2017 2045    BOTTLES DRAWN AEROBIC AND ANAEROBIC Blood Culture adequate volume Performed at [facility], [address] [place] [address] [place], [place] [address]    CULT  09/02/2017 2045    NO GROWTH 5 DAYS Performed at [facility], [place] [address] [place], [place] [address]    REPTSTATUS 09/07/2017 FINAL 09/02/2017 2045     Radiological Exams on Admission: Dg Chest 2 View  Result Date: 09/10/2017 CLINICAL DATA:  85 y/o F; shortness of breath with fluid leaking from the legs for 1 week. EXAM: CHEST - 2 VIEW COMPARISON:  09/07/2017 chest radiograph FINDINGS: Stable cardiomegaly given projection and technique. Two lead pacemaker. Aortic atherosclerosis with calcification. Stable small to moderate bilateral pleural effusions. Mild interstitial edema. Basilar opacities probably represent associated atelectasis. No pneumothorax. No acute osseous abnormality is evident. IMPRESSION: Stable small to moderate pleural effusions and basilar atelectasis. Mild interstitial edema. Electronically Signed   By: Kristine Garbe M.D.   On: 09/10/2017 17:43    Chart has been reviewed    Assessment/Plan   84 y.o. female with medical history significant ofsevere aortic stenosis valve area mean of 0.51 cm on echo on 04/29/2017, paroxysmal atrial fibrillation/flutter complicated by complete heart block status post pacer placement on anticoagulation, dementia, hypertension, anxiety, hyperlipidemia  Admitted for acute on chronic CHF exacerbation likely secondary to progressive aortic stenosis    Present on Admission: . Acute on chronic diastolic CHF (congestive heart failure) ([place]) - - admit on telemetry, cycle cardiac enzymes, obtain serial ECG, to evaluate for ischemia as a cause of heart failure  monitor daily weight  diurese with IV lasix and monitor orthostatics  and creatinine to avoid over diuresis.  Order echogram to evaluate EF and valves  suspect due to AS Make sure patient is on ACE/ARBi  Consider cardiology consult  . Pacemaker-St.Jude - stable paced . Mixed hyperlipidemia - stable cont. statin . Hypertension - stable cont home meds . Ductal carcinoma in situ (DCIS) of left breast - Continue home meds, stable . Atrial fibrillation ([place]) -          - CHA2DS2 vas score 4 : continue current anticoagulation with  Eliquis,           -  Rate control:  Currently controlled with Toprolol,         . Dementia without behavioral disturbance - stable suportive management watch for sundowning   Elevated troponin - mild, in the setting of CHF, continue to cycle  Hx of possible cellulitis - no evidence of infection at this time. Swelling was likley due to fluid overload.    Poor blood return to lower extremities - ABI ordered  Other plan as per orders.  DVT prophylaxis:  Eliquis    Code Status:    DNR/DNI  as per  family   Family Communication:   Family  at  Bedside  plan of care was discussed with   Daughter,    Disposition Plan:                             Back to current facility when stable                                                  Would benefit from PT/OT eval prior to DC   ordered                       Social Work                            Consults called: emailed cardiology   Admission status:   inpatient     Level of care    tele          I have spent a total of 56 min on this admission  Vihan Santagata 09/10/2017, 9:18 PM    [facility]  Pager [phone]   after 2 AM please page floor coverage PA If 7AM-7PM, please contact the day team taking care of the patient  [email or web address]  Password [email or web address]

## 2017-09-10 NOTE — ED Notes (Signed)
Patient advised of move to [facility] momentarily-Monique,RN

## 2017-09-10 NOTE — ED Notes (Signed)
Reported called to [facility] on [facility]; Spoke with Dr.Doutova regarding elevated trop- RN to continue to monitor, Md has paged [employer]

## 2017-09-11 ENCOUNTER — Inpatient Hospital Stay (HOSPITAL_COMMUNITY): Payer: Medicare Other

## 2017-09-11 ENCOUNTER — Encounter (HOSPITAL_COMMUNITY): Payer: Self-pay | Admitting: *Deleted

## 2017-09-11 ENCOUNTER — Ambulatory Visit: Payer: Federal, State, Local not specified - PPO | Admitting: Cardiovascular Disease

## 2017-09-11 ENCOUNTER — Telehealth: Payer: Self-pay

## 2017-09-11 DIAGNOSIS — I480 Paroxysmal atrial fibrillation: Secondary | ICD-10-CM

## 2017-09-11 DIAGNOSIS — G309 Alzheimer's disease, unspecified: Secondary | ICD-10-CM

## 2017-09-11 DIAGNOSIS — L03119 Cellulitis of unspecified part of limb: Secondary | ICD-10-CM

## 2017-09-11 DIAGNOSIS — I340 Nonrheumatic mitral (valve) insufficiency: Secondary | ICD-10-CM

## 2017-09-11 DIAGNOSIS — I482 Chronic atrial fibrillation: Secondary | ICD-10-CM

## 2017-09-11 DIAGNOSIS — I5043 Acute on chronic combined systolic (congestive) and diastolic (congestive) heart failure: Secondary | ICD-10-CM

## 2017-09-11 DIAGNOSIS — F0280 Dementia in other diseases classified elsewhere without behavioral disturbance: Secondary | ICD-10-CM

## 2017-09-11 LAB — ECHOCARDIOGRAM COMPLETE
HEIGHTINCHES: 59 in
WEIGHTICAEL: 1734.4 [oz_av]

## 2017-09-11 LAB — CBC
HCT: 42.3 % (ref 36.0–46.0)
HEMOGLOBIN: 14 g/dL (ref 12.0–15.0)
MCH: 27.2 pg (ref 26.0–34.0)
MCHC: 33.1 g/dL (ref 30.0–36.0)
MCV: 82.1 fL (ref 78.0–100.0)
Platelets: 231 10*3/uL (ref 150–400)
RBC: 5.15 MIL/uL — AB (ref 3.87–5.11)
RDW: 16.5 % — ABNORMAL HIGH (ref 11.5–15.5)
WBC: 8.2 10*3/uL (ref 4.0–10.5)

## 2017-09-11 LAB — TROPONIN I
Troponin I: 0.05 ng/mL (ref <0.03)
Troponin I: 0.04 ng/mL (ref <0.03)

## 2017-09-11 LAB — COMPREHENSIVE METABOLIC PANEL
ALT: 35 U/L (ref 14–54)
ANION GAP: 15 (ref 5–15)
AST: 35 U/L (ref 15–41)
Albumin: 3.4 g/dL — ABNORMAL LOW (ref 3.5–5.0)
Alkaline Phosphatase: 104 U/L (ref 38–126)
BUN: 38 mg/dL — ABNORMAL HIGH (ref 6–20)
CHLORIDE: 98 mmol/L — AB (ref 101–111)
CO2: 24 mmol/L (ref 22–32)
Calcium: 9.1 mg/dL (ref 8.9–10.3)
Creatinine, Ser: 1.08 mg/dL — ABNORMAL HIGH (ref 0.44–1.00)
GFR calc non Af Amer: 43 mL/min — ABNORMAL LOW (ref >60)
GFR, EST AFRICAN AMERICAN: 50 mL/min — AB (ref >60)
Glucose, Bld: 100 mg/dL — ABNORMAL HIGH (ref 65–99)
Potassium: 3.3 mmol/L — ABNORMAL LOW (ref 3.5–5.1)
Sodium: 137 mmol/L (ref 135–145)
Total Bilirubin: 1 mg/dL (ref 0.3–1.2)
Total Protein: 5.2 g/dL — ABNORMAL LOW (ref 6.5–8.1)

## 2017-09-11 LAB — PHOSPHORUS: PHOSPHORUS: 3.6 mg/dL (ref 2.5–4.6)

## 2017-09-11 LAB — MAGNESIUM: Magnesium: 1.4 mg/dL — ABNORMAL LOW (ref 1.7–2.4)

## 2017-09-11 MED ORDER — BOOST / RESOURCE BREEZE PO LIQD CUSTOM
1.0000 | Freq: Three times a day (TID) | ORAL | Status: DC
  Administered 2017-09-11 – 2017-09-13 (×4): 1 via ORAL

## 2017-09-11 MED ORDER — POTASSIUM CHLORIDE CRYS ER 20 MEQ PO TBCR
40.0000 meq | EXTENDED_RELEASE_TABLET | Freq: Once | ORAL | Status: AC
  Administered 2017-09-11: 40 meq via ORAL
  Filled 2017-09-11: qty 2

## 2017-09-11 MED ORDER — MAGNESIUM SULFATE 2 GM/50ML IV SOLN
2.0000 g | Freq: Once | INTRAVENOUS | Status: AC
  Administered 2017-09-11: 2 g via INTRAVENOUS
  Filled 2017-09-11: qty 50

## 2017-09-11 MED ORDER — FUROSEMIDE 10 MG/ML IJ SOLN
40.0000 mg | Freq: Two times a day (BID) | INTRAMUSCULAR | Status: DC
  Administered 2017-09-11 – 2017-09-14 (×7): 40 mg via INTRAVENOUS
  Filled 2017-09-11 (×7): qty 4

## 2017-09-11 MED ORDER — LIVING BETTER WITH HEART FAILURE BOOK
Freq: Once | Status: AC
  Administered 2017-09-11: 14:00:00

## 2017-09-11 MED ORDER — ZOLPIDEM TARTRATE 5 MG PO TABS
5.0000 mg | ORAL_TABLET | Freq: Every evening | ORAL | Status: DC | PRN
  Administered 2017-09-11 – 2017-09-13 (×3): 5 mg via ORAL
  Filled 2017-09-11 (×3): qty 1

## 2017-09-11 MED ORDER — ENSURE ENLIVE PO LIQD
237.0000 mL | Freq: Two times a day (BID) | ORAL | Status: DC
  Administered 2017-09-11: 237 mL via ORAL

## 2017-09-11 NOTE — Progress Notes (Signed)
Initial Nutrition Assessment  DOCUMENTATION CODES:   Not applicable  INTERVENTION:   -D/c Ensure Enlive po BID, each supplement provides 350 kcal and 20 grams of protein -Boost Breeze po TID, each supplement provides 250 kcal and 9 grams of protein  NUTRITION DIAGNOSIS:   Inadequate oral intake related to decreased appetite as evidenced by per patient/family report.  GOAL:   Patient will meet greater than or equal to 90% of their needs  MONITOR:   PO intake, Supplement acceptance, Labs, Weight trends, Skin, I & O's  REASON FOR ASSESSMENT:   Malnutrition Screening Tool, Consult Poor PO  ASSESSMENT:   Erin Charles is a 84 y.o. female with medical history significant ofsevere aortic stenosis valve area mean of 0.51 cm on echo on 04/29/2017, paroxysmal atrial fibrillation/flutter complicated by complete heart block status post pacer placement on anticoagulation, dementia, hypertension, anxiety, hyperlipidemia   Pt admitted with acute CHF.   Attempted to examine pt x 3, however, pt either in with MD or receiving nursing care at time of visit. Unable to obtain further nutrition hx or complete nutrition-focused physical exam at this time.   Per RN, pt with poor oral intake (documented meal intake 75% per doc flowsheets). However, observed meal tray in room, where pt consumed less 25%. Pt also refusing Ensure supplements per MAR.   Reviewed wt hx; noted wt has been stable over the past year, however, pt with hx of wt loss greater than once year ago.   Labs reviewed: K: 3.3, Mg: 1.4.   Diet Order:  Diet Heart Room service appropriate? Yes; Fluid consistency: Thin  EDUCATION NEEDS:   No education needs have been identified at this time  Skin:  Skin Assessment: Reviewed RN Assessment  Last BM:  09/11/17  Height:   Ht Readings from Last 1 Encounters:  09/11/17 4\' 11"  (1.499 m)    Weight:   Wt Readings from Last 1 Encounters:  09/11/17 108 lb 6.4 oz (49.2 kg)     Ideal Body Weight:  44.5 kg  BMI:  Body mass index is 21.89 kg/m.  Estimated Nutritional Needs:   Kcal:  1250-1450  Protein:  55-70 grams  Fluid:  1.2-1.4 L    Arnaldo Heffron A. Jimmye Norman, RD, LDN, CDE Pager: [phone] After hours Pager: [phone]

## 2017-09-11 NOTE — Progress Notes (Signed)
Preliminary notes by tech--Bilateral ABI/TBI completed.  Abnormal TBI bilaterally.   Non-compressible vessels bilaterally, suggestive possible medial calcification.    Hongying Consuelo Suthers (RDMS RVT) 09/11/2017 1:54 PM

## 2017-09-11 NOTE — Clinical Social Work Note (Signed)
Clinical Social Work Assessment  Patient Details  Name: Erin Charles MRN: [id number] Date of Birth: 15-Jun-1923  Date of referral:  09/11/17               Reason for consult:  Discharge Planning                Permission sought to share information with:  Facility Sport and exercise psychologist, Family Supports Permission granted to share information::  Yes, Verbal Permission Granted  Name::     Erin Charles  Agency::  [facility]  Relationship::  Husband  Contact Information:  [phone]  Housing/Transportation Living arrangements for the past 2 months:  [place] of Information:  Medical Team, Spouse, Adult Children Patient Interpreter Needed:  None Criminal Activity/Legal Involvement Pertinent to Current Situation/Hospitalization:  No - Comment as needed Significant Relationships:  Adult Children, Spouse Lives with:  Spouse, Facility Resident Do you feel safe going back to the place where you live?  Yes Need for family participation in patient care:  Yes (Comment)  Care giving concerns:  Patient is a resident at [facility].   Social Worker assessment / plan:  CSW met with patient. Husband and son at bedside. Patient's family confirmed she is from [facility]. Patient's son stated she was at [facility] after discharge in October 2018 and asked if she could go again. CSW explained that patient was recommended for home health. Patient's son expressed understanding and is agreeable to that. Family will transport patient back to the facility at discharge. No further concerns. CSW encouraged patient's family to contact CSW as needed. CSW will continue to follow patient and her family for support and facilitate discharge to ALF once medically stable.  Employment status:  Retired Forensic scientist:  Information systems manager, Other (Comment Medical illustrator) PT Recommendations:  Home with [place] / Referral to community resources:   Other (Comment Required)(Return to ALF)  Patient/Family's Response to care:  Patient not fully oriented. Patient's family agreeable to return to ALF. Patient's family supportive and involved in patient's care. Patient's family appreciated social work intervention.  Patient/Family's Understanding of and Emotional Response to Diagnosis, Current Treatment, and Prognosis:  Patient not fully oriented. Patient's family have a good understanding of the reason for admission and plan to return to ALF at discharge. Patient's family appear happy with hospital care.  Emotional Assessment Appearance:  Appears stated age Attitude/Demeanor/Rapport:  Unable to Assess Affect (typically observed):  Unable to Assess Orientation:  Oriented to Self, Oriented to Place Alcohol / Substance use:  Never Used Psych involvement (Current and /or in the community):  No (Comment)  Discharge Needs  Concerns to be addressed:  Care Coordination Readmission within the last 30 days:  Yes Current discharge risk:  None Barriers to Discharge:  Continued Medical Work up   Candie Chroman, LCSW 09/11/2017, 4:32 PM

## 2017-09-11 NOTE — Progress Notes (Signed)
OT Cancellation Note  Patient Details Name: Erin Charles MRN: [id number] DOB: 01-08-1923   Cancelled Treatment:    Reason Eval/Treat Not Completed: Patient at procedure or test/ unavailable. Echo. Plan to reattempt later today.  Tyrone Schimke OTR/L Pager: [phone]  09/11/2017, 11:02 AM

## 2017-09-11 NOTE — Progress Notes (Signed)
PROGRESS NOTE    Erin Charles  JGG:[id number] DOB: 1923/03/13 DOA: 09/10/2017 PCP: Hulan Fess, MD     Brief Narrative:  Erin Charles is a 84 y.o. female with medical history significant of severe aortic stenosis with valve area mean of 0.51 cm on echo on 04/29/2017, paroxysmal atrial fibrillation/flutter complicated by complete heart block status post pacer placement on anticoagulation, dementia, hypertension, anxiety, hyperlipidemia.  She was recently admitted at [facility] for lower extremity cellulitis.  She was treated with IV clindamycin, transition to p.o. doxycycline and discharged on March 7.  She now returns with worsening shortness of breath, lethargy, lower extremity swelling and weeping.  She did have a cardiology appointment, and was recommended to start on Lasix 40 mg daily in the meanwhile, although patient had not yet started this medication yet.  In the emergency department, BNP >4500 and chest x-ray revealed mild to moderate pleural effusions.  Patient was started on IV Lasix and admitted for acute on chronic diastolic heart failure exacerbation.  Assessment & Plan:   Active Problems:   Hypertension   Pacemaker-[employer]   Atrial fibrillation (HCC)   Ductal carcinoma in situ (DCIS) of left breast   Mixed hyperlipidemia   Elevated troponin   Dementia without behavioral disturbance   Acute on chronic diastolic CHF (congestive heart failure) (HCC)   CHF (congestive heart failure) (HCC)   Acute on chronic diastolic heart failure -BNP >4500 -Cardiology consulted overnight -Lasix 40 mg IV twice daily -Echocardiogram pending -Strict I's and O's, daily weight  Chronic atrial fibrillation -CHA2DS2VASc 4 -Continue eliquis, toprol  ?PVD -Extremities are cool to touch. ABI ordered   Hypokalemia -Replace, trend  Hypomagnesemia -Replace, trend  Essential hypertension -Continue quinapril  Hyperlipidemia -Continue Zocor  Chronic kidney disease  stage III -Stable, baseline creatinine around 1  History of breast cancer -Continue anastrozole  Dementia without behavioral disturbance -Supportive care     DVT prophylaxis: Eliquis Code Status: DNR Family Communication: Son at bedside Disposition Plan: Pending improvement in peripheral edema, cardiology consultation   Consultants:   Cardiology  Procedures:   None   Antimicrobials:  Anti-infectives (From admission, onward)   None       Subjective: Patient without complaints this morning.  Denies any shortness of breath or chest pain.  Appears slightly confused secondary to dementia.  Objective: Vitals:   09/10/17 2145 09/10/17 2200 09/11/17 0001 09/11/17 0425  BP:  (!) 146/65 134/73 (!) 139/53  Pulse:  (!) 32 70 74  Resp:   18 18  Temp:   (!) 97.3 F (36.3 C) 97.8 F (36.6 C)  TempSrc:   Oral Oral  SpO2: 100% 95% 98% 95%  Weight:   49.2 kg (108 lb 6.4 oz)   Height:   4\' 11"  (1.499 m)     Intake/Output Summary (Last 24 hours) at 09/11/2017 1237 Last data filed at 09/11/2017 1005 Gross per 24 hour  Intake 340 ml  Output 1650 ml  Net -1310 ml   Filed Weights   09/11/17 0001  Weight: 49.2 kg (108 lb 6.4 oz)    Examination:  General exam: Appears calm and comfortable  Respiratory system: Decreased breath sounds bilateral bases. Respiratory effort normal. Cardiovascular system: S1 & S2 heard, RRR, paced rhythm on telemetry. +3 Systolic murmur, +3 pitting edema bilateral lower extremities Gastrointestinal system: Abdomen is nondistended, soft and nontender. No organomegaly or masses felt. Normal bowel sounds heard. Central nervous system: Alert Extremities: Symmetric Skin: No rashes, lesions or ulcers Psychiatry: +  Dementia  Data Reviewed: I have personally reviewed following labs and imaging studies  CBC: Recent Labs  Lab 09/07/17 1613 09/10/17 1549 09/11/17 0707  WBC 7.9 7.4 8.2  NEUTROABS 5.9  --   --   HGB 14.2 13.7 14.0  HCT 43.9 42.9  42.3  MCV 82.8 82.5 82.1  PLT 254 255 332   Basic Metabolic Panel: Recent Labs  Lab 09/07/17 1613 09/10/17 1549 09/11/17 0707  NA 139 135 137  K 3.9 3.9 3.3*  CL 102 101 98*  CO2 27 23 24   GLUCOSE 118* 127* 100*  BUN 44* 38* 38*  CREATININE 1.09* 1.12* 1.08*  CALCIUM 9.5 9.3 9.1  MG  --   --  1.4*  PHOS  --   --  3.6   GFR: Estimated Creatinine Clearance: 22.2 mL/min (A) (by C-G formula based on SCr of 1.08 mg/dL (H)). Liver Function Tests: Recent Labs  Lab 09/07/17 1613 09/10/17 2108 09/11/17 0707  AST 39 37 35  ALT 48 37 35  ALKPHOS 124 108 104  BILITOT 0.6 0.8 1.0  PROT 5.6* 5.2* 5.2*  ALBUMIN 3.5 3.2* 3.4*   No results for input(s): LIPASE, AMYLASE in the last 168 hours. No results for input(s): AMMONIA in the last 168 hours. Coagulation Profile: No results for input(s): INR, PROTIME in the last 168 hours. Cardiac Enzymes: Recent Labs  Lab 09/10/17 2108 09/11/17 0238 09/11/17 0707  TROPONINI 0.04* 0.05* 0.04*   BNP (last 3 results) No results for input(s): PROBNP in the last 8760 hours. HbA1C: No results for input(s): HGBA1C in the last 72 hours. CBG: No results for input(s): GLUCAP in the last 168 hours. Lipid Profile: No results for input(s): CHOL, HDL, LDLCALC, TRIG, CHOLHDL, LDLDIRECT in the last 72 hours. Thyroid Function Tests: Recent Labs    09/10/17 2108  TSH 1.312   Anemia Panel: No results for input(s): VITAMINB12, FOLATE, FERRITIN, TIBC, IRON, RETICCTPCT in the last 72 hours. Sepsis Labs: No results for input(s): PROCALCITON, LATICACIDVEN in the last 168 hours.  Recent Results (from the past 240 hour(s))  Culture, blood (routine x 2)     Status: None   Collection Time: 09/02/17  8:40 PM  Result Value Ref Range Status   Specimen Description   Final    BLOOD LEFT ANTECUBITAL Performed at [facility], [address] [place], [place] [address]    Special Requests   Final    BOTTLES DRAWN AEROBIC AND ANAEROBIC Blood  Culture adequate volume Performed at [facility], [address] [place], [place] [address]    Culture   Final    NO GROWTH 5 DAYS Performed at [facility], [place] [address], [place], [place] [address]    Report Status 09/07/2017 FINAL  Final  Culture, blood (routine x 2)     Status: None   Collection Time: 09/02/17  8:45 PM  Result Value Ref Range Status   Specimen Description   Final    BLOOD RIGHT ANTECUBITAL Performed at [facility], [address] [place], [place] [address]    Special Requests   Final    BOTTLES DRAWN AEROBIC AND ANAEROBIC Blood Culture adequate volume Performed at [facility] [address] [place], [place] [address]    Culture   Final    NO GROWTH 5 DAYS Performed at [facility], [place] [address] [place], [place] [address]    Report Status 09/07/2017  FINAL  Final  MRSA PCR Screening     Status: None   Collection Time: 09/03/17  3:13 PM  Result Value Ref Range Status   MRSA by PCR NEGATIVE NEGATIVE Final    Comment:        The GeneXpert MRSA Assay (FDA approved for NASAL specimens only), is one component of a comprehensive MRSA colonization surveillance program. It is not intended to diagnose MRSA infection nor to guide or monitor treatment for MRSA infections. Performed at [facility] [place] [address] [place], [place] [address]        Radiology Studies: Dg Chest 2 View  Result Date: 09/10/2017 CLINICAL DATA:  84 y/o F; shortness of breath with fluid leaking from the legs for 1 week. EXAM: CHEST - 2 VIEW COMPARISON:  09/07/2017 chest radiograph FINDINGS: Stable cardiomegaly given projection and technique. Two lead pacemaker. Aortic atherosclerosis with calcification. Stable small to moderate bilateral pleural effusions. Mild interstitial edema. Basilar opacities probably represent associated atelectasis. No pneumothorax. No acute osseous abnormality is evident. IMPRESSION:  Stable small to moderate pleural effusions and basilar atelectasis. Mild interstitial edema. Electronically Signed   By: Kristine Garbe M.D.   On: 09/10/2017 17:43      Scheduled Meds: . anastrozole  1 mg Oral Daily  . apixaban  2.5 mg Oral BID  . feeding supplement (ENSURE ENLIVE)  237 mL Oral BID BM  . furosemide  40 mg Intravenous BID  . metoprolol succinate  50 mg Oral Daily  . potassium chloride  40 mEq Oral Once  . quinapril  40 mg Oral BID  . simvastatin  40 mg Oral QHS  . sodium chloride flush  3 mL Intravenous Q12H   Continuous Infusions: . sodium chloride    . magnesium sulfate 1 - 4 g bolus IVPB       LOS: 1 day    Time spent: 25 minutes   Dessa Phi, DO [facility] [email or web address] Password [facility] 09/11/2017, 12:37 PM

## 2017-09-11 NOTE — Care Management Note (Signed)
Case Management Note  Patient Details  Name: Erin Charles MRN: [id number] Date of Birth: 02-07-1923  Subjective/Objective:    CHF               Action/Plan: Patient resides at [facility]. SW aware; CM following for progression of care.  Expected Discharge Date:   possibly 09/15/2017               Expected Discharge Plan:  Assisted Living / Rest Home  Discharge planning Services  CM Consult  Status of Service:  In process, will continue to follow  Sherrilyn Rist [phone]

## 2017-09-11 NOTE — Progress Notes (Signed)
Pt unable to sleep despite Ambien 5mg  PO and Xanax 0.25mg  PO. Pt takes Ambien 10mg  at bedtime PRN and requests this dosage. This med appears on her home MAR. Will continue to monitor.

## 2017-09-11 NOTE — Consult Note (Addendum)
Cardiology Consultation:   Patient ID: FRANCES AMBROSINO; [id number]; [birth year]   Admit date: 09/10/2017 Date of Consult: 09/11/2017  Primary Care Provider: Hulan Fess, MD Primary Cardiologist: Dr. Acie Fredrickson Primary Electrophysiologist:  Dr. Lovena Le  Chief Complaint: lower extremity swelling, lethargy  Patient Profile:   KYRIANA YANKEE is a 84 y.o. female with a hx of severe aortic stenosis (pt has declined TAVR), mild-mod MR 03/2017, mild nonobstructive CAD by cath 2016, macular degeneration, complete heart block s/p St. Jude PPM 2012, high grade breast ductal cancer in situ (treated with anastrazole, not a candidate for surgery given AS), paroxysmal atrial fib/flutter, RBBB, chronic back pain (followed in pain management), CKD stage III, dementia who is being seen today for the evaluation of CHF at the request of Dr. Maylene Roes.  History of Present Illness:   To recap history, prior LHC 08/2014 showed 40% D1, 30% LM (done prior to possible TAVR which patient went on to decline to have). Last echo 03/2017 showed EF 55-60%, grade 1 DD, severe AS, mild-mod MR. Her atrial fib/flutter, paroxysmal in nature, has been managed with rate control strategy and anticoagulation.  She was admitted 05/2017 for back pain and hyponatremia at which time cardiology was consulted for mildly elevated troponin - no further workup was advised. More recently she was admitted earlier in March 2019 for lower extremity cellulitis. She was treated with clindamycin with transition to doxycycline at dc. She also had elevated LFTs and AKI that admission. She had another ER visit on 3/10 for the same issue with weeping legs. Prior regimen was continued. Daughter called the office due to edema 3/11 and Dr. Acie Fredrickson advised she start Lasix 40mg  daily as she had only been on HCTZ prior to that. Regarding this admission, the staff at her assisted living facility found her to be more lethargic than usual with increased SOB and edema so  she was brought to the hospital. Labs are notable for BNP >4500, Cr 1.12, alubmin 3.2, troponins flat at 0.03-0.05, TSH wnl. She has been treated with IV Lasix with -1.3 net output thus far. She is actually below prior office weight so has likely been losing tissue weight loss. She is unsure what medicines she is taking as she says her family handles this. CXR: stable small to moderate pleural effusions and basilar atelectasis, mild interstitial edema. Telemetry reveals atrial sensed, v paced rhythm with what appears to be occasional PACs in succession. She reports feeling generally better, no acute complaints. Laying around 30 degrees in bed without dyspnea. Edema improved. LE arterial studies showed non-compressible vessels bilaterally, suggestive possible medial calcification.   Past Medical History:  Diagnosis Date  . Advanced age   . Atrial flutter ([place])   . Chronic pain    a. followed in pain mgmt.  . Complete heart block ([place])    Pacer  . 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 Dyslipidemia   . HTN (hypertension)   . Macular degeneration   . Mild CAD    a. LHC 08/2014: 40% D1, 30% LM.  . OA (osteoarthritis)   . PAF (paroxysmal atrial fibrillation) ([place])   . RBBB (right bundle branch block)   . Severe aortic stenosis    a. pt has declined TAVR.    Past Surgical History:  Procedure Laterality Date  . EYE SURGERY    . HERNIA REPAIR    . INSERT / REPLACE / REMOVE PACEMAKER  08/30/10   DUAL CHAMBER/ST. JUDE  . LEFT  AND RIGHT HEART CATHETERIZATION WITH CORONARY ANGIOGRAM N/A 08/09/2014   Procedure: LEFT AND RIGHT HEART CATHETERIZATION WITH CORONARY ANGIOGRAM;  Surgeon: Burnell Blanks, MD;  Location: [facility] CATH LAB;  Service: Cardiovascular;  Laterality: N/A;  . PACEMAKER INSERTION    . PILONIDAL CYST EXCISION       Inpatient Medications: Scheduled Meds: . anastrozole  1 mg Oral Daily  . apixaban  2.5 mg Oral BID  . feeding supplement (ENSURE ENLIVE)  237  mL Oral BID BM  . furosemide  40 mg Intravenous BID  . metoprolol succinate  50 mg Oral Daily  . potassium chloride  40 mEq Oral Once  . quinapril  40 mg Oral BID  . simvastatin  40 mg Oral QHS  . sodium chloride flush  3 mL Intravenous Q12H   Continuous Infusions: . sodium chloride    . magnesium sulfate 1 - 4 g bolus IVPB     PRN Meds: sodium chloride, acetaminophen **OR** acetaminophen, ALPRAZolam, HYDROcodone-acetaminophen, ondansetron **OR** ondansetron (ZOFRAN) IV, sodium chloride flush, traMADol, zolpidem  Home Meds: Prior to Admission medications   Medication Sig Start Date End Date Taking? Authorizing Provider  acetaminophen (TYLENOL) 325 MG tablet Take 650 mg by mouth every 8 (eight) hours as needed for mild pain.    Yes [provider]  ALPRAZolam (XANAX) 0.25 MG tablet Take 1 tablet (0.25 mg total) by mouth every 8 (eight) hours as needed for anxiety. 09/04/17  Yes Roxan Hockey, MD  anastrozole (ARIMIDEX) 1 MG tablet Take 1 tablet (1 mg total) daily by mouth. 05/13/17  Yes Truitt Merle, MD  doxycycline (VIBRA-TABS) 100 MG tablet Take 1 tablet (100 mg total) by mouth 2 (two) times daily. 09/04/17  Yes Emokpae, Courage, MD  ELIQUIS 2.5 MG TABS tablet TAKE 1 TABLET BY MOUTH TWICE A DAY 02/24/17  Yes Nahser, Wonda Cheng, MD  hydrochlorothiazide (HYDRODIURIL) 12.5 MG tablet Take 1 tablet (12.5 mg total) by mouth daily. 09/04/17  Yes Roxan Hockey, MD  lactobacillus acidophilus & bulgar (LACTINEX) chewable tablet Chew 1 tablet by mouth 3 (three) times daily with meals. 09/04/17  Yes Emokpae, Courage, MD  metoprolol succinate (TOPROL-XL) 50 MG 24 hr tablet TAKE 1 TABLET BY MOUTH DAILY 01/20/17  Yes Nahser, Wonda Cheng, MD  NITROSTAT 0.4 MG SL tablet PLACE 1 TABLET (0.4 MG TOTAL) UNDER THE TONGUE EVERY 5 (FIVE) MINUTES AS NEEDED FOR CHEST PAIN. 04/29/16  Yes Nahser, Wonda Cheng, MD  potassium chloride (K-DUR,KLOR-CON) 10 MEQ tablet Take 10 mEq by mouth daily.    Yes [provider]    quinapril (ACCUPRIL) 40 MG tablet Take 40 mg by mouth 2 (two) times daily.    Yes [provider]  simvastatin (ZOCOR) 40 MG tablet Take 40 mg by mouth at bedtime.    Yes Nahser, Wonda Cheng, MD  traMADol (ULTRAM) 50 MG tablet Take 1 tablet (50 mg total) by mouth every 8 (eight) hours as needed for moderate pain or severe pain (not relieved by tylenol #3). Patient taking differently: Take 50 mg by mouth every 6 (six) hours as needed for moderate pain.  05/02/17  Yes Barton Dubois, MD  zolpidem (AMBIEN) 10 MG tablet Take 10 mg by mouth at bedtime as needed for sleep.  09/15/13  Yes [provider]    Allergies:    Allergies  Allergen Reactions  . Macrodantin Rash  . Penicillins Rash and Other (See Comments)    Many years ago. Has patient had a PCN reaction causing immediate rash,  facial/tongue/throat swelling, SOB or lightheadedness with hypotension: unknown Has patient had a PCN reaction causing severe rash involving mucus membranes or skin necrosis: unknown Has patient had a PCN reaction that required hospitalization: unknown Has patient had a PCN reaction occurring within the last 10 years: No If all of the above answers are "NO", then may proceed with Cephalosporin use.   Marland Kitchen Tolectin [Tolmetin Sodium] Rash    Social History:   Social History   Socioeconomic History  . Marital status: Married    Spouse name: Not on file  . Number of children: Not on file  . Years of education: Not on file  . Highest education level: Not on file  Social Needs  . Financial resource strain: Not on file  . Food insecurity - worry: Not on file  . Food insecurity - inability: Not on file  . Transportation needs - medical: Not on file  . Transportation needs - non-medical: Not on file  Occupational History  . Not on file  Tobacco Use  . Smoking status: Never Smoker  . Smokeless tobacco: Never Used  Substance and Sexual Activity  . Alcohol use: No  . Drug use: No  . Sexual  activity: Not on file  Other Topics Concern  . Not on file  Social History Narrative  . Not on file    Family History:   The patient's family history includes Cancer in her mother; Heart attack in her sister; Heart failure in her father.  ROS:  Please see the history of present illness. All other ROS reviewed and negative but somewhat limited by pts memory loss  Physical Exam/Data:   Vitals:   09/10/17 2145 09/10/17 2200 09/11/17 0001 09/11/17 0425  BP:  (!) 146/65 134/73 (!) 139/53  Pulse:  (!) 32 70 74  Resp:   18 18  Temp:   (!) 97.3 F (36.3 C) 97.8 F (36.6 C)  TempSrc:   Oral Oral  SpO2: 100% 95% 98% 95%  Weight:   108 lb 6.4 oz (49.2 kg)   Height:   4\' 11"  (1.499 m)     Intake/Output Summary (Last 24 hours) at 09/11/2017 1230 Last data filed at 09/11/2017 1005 Gross per 24 hour  Intake 340 ml  Output 1650 ml  Net -1310 ml   Filed Weights   09/11/17 0001  Weight: 108 lb 6.4 oz (49.2 kg)   Body mass index is 21.89 kg/m.  General: Frail elderly thin WF in no acute distress. Head: Normocephalic, atraumatic, sclera non-icteric, no xanthomas, nares are without discharge.  Neck: Negative for carotid bruits. JVD not elevated. Lungs: Slightly diminished BS  bilaterally to auscultation without wheezes, rales, or rhonchi. Breathing is unlabored. Heart:RRR with occasional irregularity, 3/6 SEM with absent S2. No rubs or gallops appreciated. Abdomen: No hepatomegaly. No rebound/guarding. No obvious abdominal masses. Msk: Generalized atrophy Extremities: No clubbing or cyanosis.Slightly erythematous LE with chronic venous stasis changes but with trace-1+ LEE Neuro: Alert and oriented X 3 but defers to family to help answer detailed questions. No facial asymmetry. No focal deficit. Moves all extremities spontaneously. Psych:  Responds to questions appropriately with a normal affect.  EKG:  The EKG was personally reviewed and demonstrates at times atrial sensed/V pacing, AV  pacing, occasional ectopy   Relevant CV Studies: 2d echo 03/2017 Study Conclusions  - Left ventricle: The cavity size was normal. There was moderate   concentric hypertrophy. Systolic function was normal. The   estimated ejection fraction was in the range  of 55% to 60%. Wall   motion was normal; there were no regional wall motion   abnormalities. Doppler parameters are consistent with abnormal   left ventricular relaxation (grade 1 diastolic dysfunction). - Ventricular septum: Septal motion showed paradox. These changes   are consistent with right ventricular pacing. - Aortic valve: Valve mobility was restricted. There was severe   stenosis. There was trivial regurgitation. Valve area (VTI): 0.6   cm^2. Valve area (Vmax): 0.51 cm^2. Valve area (Vmean): 0.51   cm^2. - Mitral valve: There was mild to moderate regurgitation directed   centrally. - Left atrium: The atrium was mildly dilated.   Laboratory Data:  Chemistry Recent Labs  Lab 09/07/17 1613 09/10/17 1549 09/11/17 0707  NA 139 135 137  K 3.9 3.9 3.3*  CL 102 101 98*  CO2 27 23 24   GLUCOSE 118* 127* 100*  BUN 44* 38* 38*  CREATININE 1.09* 1.12* 1.08*  CALCIUM 9.5 9.3 9.1  GFRNONAA 42* 41* 43*  GFRAA 49* 48* 50*  ANIONGAP 10 11 15     Recent Labs  Lab 09/07/17 1613 09/10/17 2108 09/11/17 0707  PROT 5.6* 5.2* 5.2*  ALBUMIN 3.5 3.2* 3.4*  AST 39 37 35  ALT 48 37 35  ALKPHOS 124 108 104  BILITOT 0.6 0.8 1.0   Hematology Recent Labs  Lab 09/07/17 1613 09/10/17 1549 09/11/17 0707  WBC 7.9 7.4 8.2  RBC 5.30* 5.20* 5.15*  HGB 14.2 13.7 14.0  HCT 43.9 42.9 42.3  MCV 82.8 82.5 82.1  MCH 26.8 26.3 27.2  MCHC 32.3 31.9 33.1  RDW 16.2* 16.2* 16.5*  PLT 254 255 231   Cardiac Enzymes Recent Labs  Lab 09/10/17 2108 09/11/17 0238 09/11/17 0707  TROPONINI 0.04* 0.05* 0.04*    Recent Labs  Lab 09/10/17 1616  TROPIPOC 0.03    BNP Recent Labs  Lab 09/07/17 1613 09/10/17 1549  BNP >4,500.0*  >4,500.0*    DDimer No results for input(s): DDIMER in the last 168 hours.  Radiology/Studies:  Dg Chest 2 View  Result Date: 09/10/2017 CLINICAL DATA:  84 y/o F; shortness of breath with fluid leaking from the legs for 1 week. EXAM: CHEST - 2 VIEW COMPARISON:  09/07/2017 chest radiograph FINDINGS: Stable cardiomegaly given projection and technique. Two lead pacemaker. Aortic atherosclerosis with calcification. Stable small to moderate bilateral pleural effusions. Mild interstitial edema. Basilar opacities probably represent associated atelectasis. No pneumothorax. No acute osseous abnormality is evident. IMPRESSION: Stable small to moderate pleural effusions and basilar atelectasis. Mild interstitial edema. Electronically Signed   By: Kristine Garbe M.D.   On: 09/10/2017 17:43   Dg Chest 2 View  Result Date: 09/07/2017 CLINICAL DATA:  Bilateral lower extremity edema. EXAM: CHEST - 2 VIEW COMPARISON:  09/02/2017 FINDINGS: Cardiac silhouette is mildly enlarged. No mediastinal or hilar masses. Left anterior chest wall sequential pacemaker is stable. Moderate pleural effusions, increased when compared to the prior radiographs. Lungs are hyperexpanded. Mild basilar atelectasis. No evidence of pneumonia or pulmonary edema. No pneumothorax. Skeletal structures are demineralized but grossly intact. IMPRESSION: 1. Moderate pleural effusions which have increased in size from the prior radiographs. 2. No evidence of pneumonia or pulmonary edema. Electronically Signed   By: Lajean Manes M.D.   On: 09/07/2017 15:56    Assessment and Plan:   1. Acute on likely chronic diastolic CHF as a sequelae of severe AS - suspect we are seeing the natural history of severe aortic stenosis play out. Discussed with family at bedside (son, husband)  about the fact that we can attempt to treat the symptoms of her edema and pleural effusions but the underlying cause will remain the same, and therefore there is only so  much that can be done. We can focus on symptom management and quality of life but I discussed that even these eventually become limited by renal function and hypotension, particularly given her age and frailty. She has not been previously seen by palliative care to discuss options but home hospice is likely the next step given her poor prognosis and recurrent admissions. I see she is already a DNR. Will consult palliative care for assistance.  2. Paroxysmal atrial fib/flutter - appears to be both intrinsic P wave activity on EKG as well as atrial pacing. I do not see that device has been interrogated in our office since 2017. Have asked Trish to contact today's St. Jude rep to come interrogate pacer. Continue apixaban, dose adjusted for age/weight.  3. CKD stage III - Cr appears at baseline.  4. Minimally elevated troponin - do not suspect ACS given low/flat trend, no chest pain, and minimal CAD by cath in 2016. Do not plan further w/u at this time.  5. Hypomagnesemia/hyperkalemia - being repleted by primary team.  For questions or updates, please contact [facility] Please consult [email or web address] for contact info under Cardiology/STEMI.  Signed, Charlie Pitter, PA-C  09/11/2017 12:30 PM   I have seen and examined the patient along with Melina Copa, PA.  I have reviewed the chart, notes and new data.  I agree with PA's note.  Key new complaints: Both her dyspnea and edema have improved substantially since hospitalization, but she has really not left the bed yet. Key examination changes: Prominent jugular venous distention, 2-3+ symmetrical lower extremity edema halfway to the knees bilaterally, but with some developing wrinkles; no evidence of erythema anymore; delayed carotid pulses; soft, distant late peaking systolic ejection murmur, absent second heart sounds.  Lungs are clear Key new findings / data: ECG shows atrial sensed, ventricular paced rhythm with brief runs of paroxysmal atrial  tachycardia and frequent PACs.  Pacemaker interrogation pending  Echo now shows depressed left ventricular systolic function with ejection fraction around 35%.  PLAN: Mrs. Moline has terminal biventricular heart failure due to critical aortic stenosis, now with depressed left ventricular systolic function. Thankfully, we are achieving reasonably good symptom relief with diuretic therapy and she is tolerating this without hypotension or renal failure, so far. I had a discussion with the patient and her son about the dismal prognosis with aortic stenosis in the absence of aortic valve replacement.  They are aware of this and Mrs. Mccleary has been firm in her decision not to pursue any surgical interventions, even TAVR. At this point we are limited to providing symptom relief with diuretic therapy.  I think it is time to engage a palliative care consult and discuss options such as home hospice, home oxygen and a variety of living condition interventions for symptom palliation. Although it is always very difficult to make firm prognoses in the circumstances, I would estimate her life expectancy at under 6 months.  Sanda Klein, MD, [place] [phone] 09/11/2017, 3:30 PM

## 2017-09-11 NOTE — Progress Notes (Signed)
  Echocardiogram 2D Echocardiogram has been performed.  Erin Charles 09/11/2017, 11:47 AM

## 2017-09-11 NOTE — Evaluation (Signed)
Physical Therapy Evaluation Patient Details Name: Erin Charles MRN: [id number] DOB: 27-Feb-1923 Today's Date: 09/11/2017   History of Present Illness  Pt adm with acute on chronic diastolic heart failure. PMH - dementia, aoritic stenosis, chronic back pain, pacer, htn, anxiety  Clinical Impression  Pt admitted with above diagnosis and presents to PT with functional limitations due to deficits listed below (See PT problem list). Pt needs skilled PT to maximize independence and safety to allow discharge back to ALF.  Expect pt is close to her baseline.      Follow Up Recommendations Other (comment)(Return to ALF with PT)    Equipment Recommendations  None recommended by PT    Recommendations for Other Services       Precautions / Restrictions Precautions Precautions: Fall Restrictions Weight Bearing Restrictions: No      Mobility  Bed Mobility               General bed mobility comments: Pt up in chair  Transfers Overall transfer level: Needs assistance Equipment used: Rolling walker (2 wheeled) Transfers: Sit to/from Stand Sit to Stand: Min guard         General transfer comment: Assist for safety  Ambulation/Gait Ambulation/Gait assistance: Min guard Ambulation Distance (Feet): 50 Feet Assistive device: Rolling walker (2 wheeled) Gait Pattern/deviations: Step-through pattern;Decreased step length - right;Decreased step length - left;Trunk flexed Gait velocity: decr Gait velocity interpretation: <1.8 ft/sec, indicative of risk for recurrent falls General Gait Details: Assist for safety. Pt with forearms on handles of walker due to walker to tall. Youth walker brought to room for pt  Stairs            Wheelchair Mobility    Modified Rankin (Stroke Patients Only)       Balance Overall balance assessment: Needs assistance Sitting-balance support: No upper extremity supported;Feet supported Sitting balance-Leahy Scale: Fair     Standing  balance support: Bilateral upper extremity supported Standing balance-Leahy Scale: Poor Standing balance comment: UE supp                             Pertinent Vitals/Pain Pain Assessment: No/denies pain    Home Living Family/patient expects to be discharged to:: Assisted living               Home Equipment: Kasandra Knudsen - single point;Wheelchair - [employer] - 2 wheels      Prior Function Level of Independence: Needs assistance   Gait / Transfers Assistance Needed: per pt amb modified independent. Unsure if uses walker           Hand Dominance        Extremity/Trunk Assessment   Upper Extremity Assessment Upper Extremity Assessment: Defer to OT evaluation    Lower Extremity Assessment Lower Extremity Assessment: Generalized weakness    Cervical / Trunk Assessment Cervical / Trunk Assessment: Kyphotic  Communication   Communication: HOH  Cognition Arousal/Alertness: Awake/alert Behavior During Therapy: Impulsive Overall Cognitive Status: No family/caregiver present to determine baseline cognitive functioning                                 General Comments: dementia at baseline      General Comments      Exercises     Assessment/Plan    PT Assessment Patient needs continued PT services  PT Problem List Decreased strength;Decreased activity tolerance;Decreased balance;Decreased mobility;Decreased safety awareness  PT Treatment Interventions DME instruction;Gait training;Functional mobility training;Therapeutic activities;Therapeutic exercise;Balance training;Patient/family education    PT Goals (Current goals can be found in the Care Plan section)  Acute Rehab PT Goals Patient Stated Goal: pt unable to state PT Goal Formulation: Patient unable to participate in goal setting Time For Goal Achievement: 09/25/17 Potential to Achieve Goals: Good    Frequency Min 3X/week   Barriers to discharge         Co-evaluation               AM-PAC PT "6 Clicks" Daily Activity  Outcome Measure Difficulty turning over in bed (including adjusting bedclothes, sheets and blankets)?: A Little Difficulty moving from lying on back to sitting on the side of the bed? : A Little Difficulty sitting down on and standing up from a chair with arms (e.g., wheelchair, bedside commode, etc,.)?: A Little Help needed moving to and from a bed to chair (including a wheelchair)?: A Little Help needed walking in hospital room?: A Little Help needed climbing 3-5 steps with a railing? : A Lot 6 Click Score: 17    End of Session Equipment Utilized During Treatment: Gait belt Activity Tolerance: Patient limited by fatigue Patient left: in chair;with call bell/phone within reach;with chair alarm set Nurse Communication: Mobility status PT Visit Diagnosis: Unsteadiness on feet (R26.81);Muscle weakness (generalized) (M62.81)    Time: 5797-2820 PT Time Calculation (min) (ACUTE ONLY): 15 min   Charges:   PT Evaluation $PT Eval Low Complexity: 1 Low     PT G CodesMarland Kitchen        [facility] [place] 09/11/2017, 3:02 PM

## 2017-09-11 NOTE — Discharge Instructions (Signed)

## 2017-09-12 DIAGNOSIS — I350 Nonrheumatic aortic (valve) stenosis: Secondary | ICD-10-CM

## 2017-09-12 DIAGNOSIS — Z950 Presence of cardiac pacemaker: Secondary | ICD-10-CM

## 2017-09-12 DIAGNOSIS — Z515 Encounter for palliative care: Secondary | ICD-10-CM

## 2017-09-12 DIAGNOSIS — I495 Sick sinus syndrome: Secondary | ICD-10-CM

## 2017-09-12 DIAGNOSIS — Z7189 Other specified counseling: Secondary | ICD-10-CM

## 2017-09-12 DIAGNOSIS — I5033 Acute on chronic diastolic (congestive) heart failure: Secondary | ICD-10-CM

## 2017-09-12 LAB — BASIC METABOLIC PANEL
Anion gap: 11 (ref 5–15)
BUN: 40 mg/dL — AB (ref 6–20)
CHLORIDE: 96 mmol/L — AB (ref 101–111)
CO2: 32 mmol/L (ref 22–32)
CREATININE: 1.21 mg/dL — AB (ref 0.44–1.00)
Calcium: 8.8 mg/dL — ABNORMAL LOW (ref 8.9–10.3)
GFR calc Af Amer: 43 mL/min — ABNORMAL LOW (ref >60)
GFR calc non Af Amer: 37 mL/min — ABNORMAL LOW (ref >60)
Glucose, Bld: 106 mg/dL — ABNORMAL HIGH (ref 65–99)
POTASSIUM: 3.1 mmol/L — AB (ref 3.5–5.1)
Sodium: 139 mmol/L (ref 135–145)

## 2017-09-12 LAB — MAGNESIUM: Magnesium: 1.4 mg/dL — ABNORMAL LOW (ref 1.7–2.4)

## 2017-09-12 MED ORDER — POTASSIUM CHLORIDE CRYS ER 20 MEQ PO TBCR
40.0000 meq | EXTENDED_RELEASE_TABLET | Freq: Two times a day (BID) | ORAL | Status: AC
  Administered 2017-09-12 (×2): 40 meq via ORAL
  Filled 2017-09-12 (×2): qty 2

## 2017-09-12 MED ORDER — MAGNESIUM SULFATE 2 GM/50ML IV SOLN
2.0000 g | Freq: Once | INTRAVENOUS | Status: AC
  Administered 2017-09-12: 2 g via INTRAVENOUS
  Filled 2017-09-12: qty 50

## 2017-09-12 NOTE — Consult Note (Signed)
Consultation Note Date: 09/12/2017   Patient Name: Erin Charles  DOB: December 08, 1922  MRN: [id number]  Age / Sex: 84 y.o., female  PCP: Hulan Fess, MD Referring Physician: Dessa Phi, DO  Reason for Consultation: Establishing goals of care, Hospice Evaluation and Psychosocial/spiritual support  HPI/Patient Profile: 84 y.o. female  with past medical history of severe aortic stenosis, atrial fib, complete heart block with pacemaker insertion, dementia, hypertension, hyperlipidemia, admitted on 09/10/2017 with shortness of breath, leg edema.  Patient and her husband reside at assisted living facility, [facility].  Per chest x-ray patient had mild to moderate pleural effusions; BNP was greater than 4500  Consult ordered for goals of care and to discuss hospice support in the home.   Clinical Assessment and Goals of Care: Met with patient and patient's husband, chart reviewed.  Patient verbalizes feeling better although she does not remember why she came into the hospital.  She does deny feeling short of breath and states she has a backache but this backache is not new.  She is pleasantly confused with short-term memory deficits.  Her husband is at the bedside and has a similar presentation  Patient's daughter, Erin Charles is healthcare power of attorney; [phone]  They have 4 children altogether.  She is originally from [place]    SUMMARY OF RECOMMENDATIONS   Family meeting planned to address goals of care specifically hospice support in assisted living facility, 09/13/2017 at 12 PM Patient likely would qualify for her hospice in-home benefit secondary to severe aortic stenosis symptomatic dyspnea with comorbidities of complete heart block atrial fib and new LVH with an ejection fraction of 35% (new as compared to echo done 03/2017 Code Status/Advance Care Planning:  DNR    Symptom Management:    Dyspnea: Continue with targeted pulmonary treatments such as oxygen, nebulizer treatments.  As symptoms advance, low-dose opioids may be indicated for shortness of breath  Pain: Patient has had chronic back pain.  Continue with Vicodin as needed  Palliative Prophylaxis:   Aspiration, Bowel Regimen, Delirium Protocol, Eye Care, Frequent Pain Assessment, Oral Care and Turn Reposition  Additional Recommendations (Limitations, Scope, Preferences):  No Surgical Procedures and No Tracheostomy  Psycho-social/Spiritual:   Desire for further Chaplaincy support:no  Additional Recommendations: Referral to Community Resources   Prognosis:   < 6 months  secondary to severe aortic stenosis symptomatic dyspnea with comorbidities of complete heart block atrial fib and new LVH with an ejection fraction of 35% (new as compared to echo done 03/2017  Discharge Planning: Back to [facility]      Primary Diagnoses: Present on Admission: . Pacemaker-St.Jude . Mixed hyperlipidemia . Hypertension . Ductal carcinoma in situ (DCIS) of left breast . Atrial fibrillation ([place]) . Dementia without behavioral disturbance . Acute on chronic diastolic CHF (congestive heart failure) ([place]) . Elevated troponin   I have reviewed the medical record, interviewed the patient and family, and examined the patient. The following aspects are pertinent.  Past Medical History:  Diagnosis Date  . Advanced age   .  Atrial flutter ([place])   . Chronic combined systolic and diastolic CHF (congestive heart failure) ([place])   . Chronic pain    a. followed in pain mgmt.  . CKD (chronic kidney disease), stage III ([place])   . Complete heart block ([place])    Pacer  . 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 Dyslipidemia   . HTN (hypertension)   . Macular degeneration   . Mild CAD    a. LHC 08/2014: 40% D1, 30% LM.  . Mild dementia   . OA (osteoarthritis)   . PAF (paroxysmal atrial fibrillation) ([place])    . RBBB (right bundle branch block)   . Severe aortic stenosis    a. pt has declined TAVR.   Social History   Socioeconomic History  . Marital status: Married    Spouse name: None  . Number of children: None  . Years of education: None  . Highest education level: None  Social Needs  . Financial resource strain: None  . Food insecurity - worry: None  . Food insecurity - inability: None  . Transportation needs - medical: None  . Transportation needs - non-medical: None  Occupational History  . None  Tobacco Use  . Smoking status: Never Smoker  . Smokeless tobacco: Never Used  Substance and Sexual Activity  . Alcohol use: No  . Drug use: No  . Sexual activity: None  Other Topics Concern  . None  Social History Narrative  . None   Family History  Problem Relation Age of Onset  . Cancer Mother        Bladder cancer  . Heart failure Father   . Heart attack Sister    Scheduled Meds: . anastrozole  1 mg Oral Daily  . apixaban  2.5 mg Oral BID  . feeding supplement  1 Container Oral TID BM  . furosemide  40 mg Intravenous BID  . metoprolol succinate  50 mg Oral Daily  . potassium chloride  40 mEq Oral BID  . quinapril  40 mg Oral BID  . simvastatin  40 mg Oral QHS  . sodium chloride flush  3 mL Intravenous Q12H   Continuous Infusions: . sodium chloride     PRN Meds:.sodium chloride, acetaminophen **OR** acetaminophen, ALPRAZolam, HYDROcodone-acetaminophen, ondansetron **OR** ondansetron (ZOFRAN) IV, sodium chloride flush, traMADol, zolpidem Medications Prior to Admission:  Prior to Admission medications   Medication Sig Start Date End Date Taking? Authorizing Provider  acetaminophen (TYLENOL) 325 MG tablet Take 650 mg by mouth every 8 (eight) hours as needed for mild pain.    Yes [provider]  ALPRAZolam (XANAX) 0.25 MG tablet Take 1 tablet (0.25 mg total) by mouth every 8 (eight) hours as needed for anxiety. 09/04/17  Yes Roxan Hockey, MD  anastrozole  (ARIMIDEX) 1 MG tablet Take 1 tablet (1 mg total) daily by mouth. 05/13/17  Yes Truitt Merle, MD  doxycycline (VIBRA-TABS) 100 MG tablet Take 1 tablet (100 mg total) by mouth 2 (two) times daily. 09/04/17  Yes Emokpae, Courage, MD  ELIQUIS 2.5 MG TABS tablet TAKE 1 TABLET BY MOUTH TWICE A DAY 02/24/17  Yes Nahser, Wonda Cheng, MD  hydrochlorothiazide (HYDRODIURIL) 12.5 MG tablet Take 1 tablet (12.5 mg total) by mouth daily. 09/04/17  Yes Roxan Hockey, MD  lactobacillus acidophilus & bulgar (LACTINEX) chewable tablet Chew 1 tablet by mouth 3 (three) times daily with meals. 09/04/17  Yes Emokpae, Courage, MD  metoprolol succinate (TOPROL-XL) 50 MG 24 hr tablet TAKE  1 TABLET BY MOUTH DAILY 01/20/17  Yes Nahser, Wonda Cheng, MD  NITROSTAT 0.4 MG SL tablet PLACE 1 TABLET (0.4 MG TOTAL) UNDER THE TONGUE EVERY 5 (FIVE) MINUTES AS NEEDED FOR CHEST PAIN. 04/29/16  Yes Nahser, Wonda Cheng, MD  potassium chloride (K-DUR,KLOR-CON) 10 MEQ tablet Take 10 mEq by mouth daily.    Yes [provider]  quinapril (ACCUPRIL) 40 MG tablet Take 40 mg by mouth 2 (two) times daily.    Yes [provider]  simvastatin (ZOCOR) 40 MG tablet Take 40 mg by mouth at bedtime.    Yes Nahser, Wonda Cheng, MD  traMADol (ULTRAM) 50 MG tablet Take 1 tablet (50 mg total) by mouth every 8 (eight) hours as needed for moderate pain or severe pain (not relieved by tylenol #3). Patient taking differently: Take 50 mg by mouth every 6 (six) hours as needed for moderate pain.  05/02/17  Yes Barton Dubois, MD  zolpidem (AMBIEN) 10 MG tablet Take 10 mg by mouth at bedtime as needed for sleep.  09/15/13  Yes [provider]   Allergies  Allergen Reactions  . Macrodantin Rash  . Penicillins Rash and Other (See Comments)    Many years ago. Has patient had a PCN reaction causing immediate rash, facial/tongue/throat swelling, SOB or lightheadedness with hypotension: unknown Has patient had a PCN reaction causing severe rash involving mucus  membranes or skin necrosis: unknown Has patient had a PCN reaction that required hospitalization: unknown Has patient had a PCN reaction occurring within the last 10 years: No If all of the above answers are "NO", then may proceed with Cephalosporin use.   Marland Kitchen Tolectin [Tolmetin Sodium] Rash   Review of Systems  Unable to perform ROS: Dementia    Physical Exam  Constitutional:  Frail elderly female; in no acute distress  HENT:  Head: Normocephalic and atraumatic.  Neck: Normal range of motion.  Cardiovascular: Normal rate.  Pulmonary/Chest:  Shortness of breath with exertion  Neurological: She is alert.  Oriented to herself and that she is in the hospital; does not remember why she came to the hospital  Skin: Skin is warm and dry. There is pallor.  Psychiatric: She has a normal mood and affect.  Short-term memory deficits noted  Nursing note and vitals reviewed.   Vital Signs: BP 117/74 (BP Location: Left Arm)   Pulse (!) 53   Temp (!) 97.5 F (36.4 C) (Oral)   Resp 18   Ht _0  (1.499 m)   Wt 48.5 kg (106 lb 14.8 oz)   SpO2 100%   BMI 21.60 kg/m  Pain Assessment: No/denies pain   Pain Score: 0-No pain   SpO2: SpO2: 100 % O2 Device:SpO2: 100 % O2 Flow Rate: .   IO: Intake/output summary:   Intake/Output Summary (Last 24 hours) at 09/12/2017 1632 Last data filed at 09/12/2017 1335 Gross per 24 hour  Intake 920 ml  Output 2400 ml  Net -1480 ml    LBM: Last BM Date: 09/09/17 Baseline Weight: Weight: 49.2 kg (108 lb 6.4 oz) Most recent weight: Weight: 48.5 kg (106 lb 14.8 oz)     Palliative Assessment/Data:   Flowsheet Rows     Most Recent Value  Intake Tab  Referral Department  Hospitalist  Unit at Time of Referral  Cardiac/Telemetry Unit  Palliative Care Primary Diagnosis  Cardiac  Date Notified  09/11/17  Palliative Care Type  New Palliative care  Reason for referral  Clarify Goals of Care  Date  of Admission  09/10/17  Date first seen by  Palliative Care  09/12/17  # of days Palliative referral response time  1 Day(s)  # of days IP prior to Palliative referral  1  Clinical Assessment  Palliative Performance Scale Score  50%  Pain Max last 24 hours  Not able to report  Pain Min Last 24 hours  Not able to report  Dyspnea Max Last 24 Hours  Not able to report  Dyspnea Min Last 24 hours  Not able to report  Nausea Max Last 24 Hours  Not able to report  Nausea Min Last 24 Hours  Not able to report  Anxiety Max Last 24 Hours  Not able to report  Anxiety Min Last 24 Hours  Not able to report  Other Max Last 24 Hours  Not able to report  Psychosocial & Spiritual Assessment  Palliative Care Outcomes  Patient/Family meeting held?  Yes  Who was at the meeting?  pt and husband  Patient/Family wishes: Interventions discontinued/not started   Mechanical Ventilation, Vasopressors, Trach  Palliative Care follow-up planned  Yes, Facility      Time In: 1530 Time Out: 1640 Time Total: 70 min Greater than 50%  of this time was spent counseling and coordinating care related to the above assessment and plan.  Signed by: Dory Horn, NP   Please contact Palliative Medicine Team phone at [phone] for questions and concerns.  For individual provider: See Shea Evans

## 2017-09-12 NOTE — Evaluation (Signed)
Occupational Therapy Evaluation and Discharge Patient Details Name: Erin Charles MRN: [id number] DOB: March 06, 1923 Today's Date: 09/12/2017    History of Present Illness Pt adm with acute on chronic diastolic heart failure. PMH - dementia, aoritic stenosis, chronic back pain, pacer, htn, anxiety   Clinical Impression   Pt at baseline with ADL and functional transfers. Pt very pleasant and cooperative throughout session with OT and agreeable to toilet transfer, sink level grooming, and short hallway ambulation. Pt has no questions or concerns at this time. No family present throughout session to provide additional information/questions. Thank you for the opportunity to serve this patient.     Follow Up Recommendations  No OT follow up;Supervision/Assistance - 24 hour    Equipment Recommendations  None recommended by OT    Recommendations for Other Services       Precautions / Restrictions Precautions Precautions: Fall Restrictions Weight Bearing Restrictions: No      Mobility Bed Mobility               General bed mobility comments: Pt up in chair  Transfers Overall transfer level: Needs assistance Equipment used: Rolling walker (2 wheeled) Transfers: Sit to/from Stand Sit to Stand: Min guard         General transfer comment: Assist for safety    Balance Overall balance assessment: Needs assistance Sitting-balance support: No upper extremity supported;Feet supported Sitting balance-Leahy Scale: Fair     Standing balance support: Bilateral upper extremity supported Standing balance-Leahy Scale: Poor Standing balance comment: UE supp                           ADL either performed or assessed with clinical judgement   ADL Overall ADL's : At baseline                                             Vision Patient Visual Report: No change from baseline       Perception     Praxis      Pertinent Vitals/Pain Pain  Assessment: No/denies pain     Hand Dominance Left   Extremity/Trunk Assessment Upper Extremity Assessment Upper Extremity Assessment: Generalized weakness   Lower Extremity Assessment Lower Extremity Assessment: Generalized weakness   Cervical / Trunk Assessment Cervical / Trunk Assessment: Kyphotic   Communication Communication Communication: HOH   Cognition Arousal/Alertness: Awake/alert Behavior During Therapy: Impulsive Overall Cognitive Status: No family/caregiver present to determine baseline cognitive functioning                                 General Comments: dementia at baseline   General Comments       Exercises     Shoulder Instructions      Home Living Family/patient expects to be discharged to:: Assisted living                             Home Equipment: Kasandra Knudsen - single point;Wheelchair - [employer] - 2 wheels          Prior Functioning/Environment Level of Independence: Needs assistance  Gait / Transfers Assistance Needed: per pt amb modified independent with rolling walker ADL's / Homemaking Assistance Needed: husband or ADF staff assist with bathing/dressing  OT Problem List:        OT Treatment/Interventions:      OT Goals(Current goals can be found in the care plan section) Acute Rehab OT Goals Patient Stated Goal: to get back home OT Goal Formulation: With patient Time For Goal Achievement: 09/26/17 Potential to Achieve Goals: Good  OT Frequency:     Barriers to D/C:            Co-evaluation              AM-PAC PT "6 Clicks" Daily Activity     Outcome Measure Help from another person eating meals?: None Help from another person taking care of personal grooming?: None Help from another person toileting, which includes using toliet, bedpan, or urinal?: None Help from another person bathing (including washing, rinsing, drying)?: A Little Help from another person to put on and  taking off regular upper body clothing?: A Little Help from another person to put on and taking off regular lower body clothing?: A Little 6 Click Score: 21   End of Session Equipment Utilized During Treatment: Gait belt;Rolling walker Nurse Communication: Mobility status  Activity Tolerance: Patient tolerated treatment well Patient left: in chair;with call bell/phone within reach;with chair alarm set                   Time: 1015-1035 OT Time Calculation (min): 20 min Charges:  OT General Charges $OT Visit: 1 Visit OT Evaluation $OT Eval Moderate Complexity: 1 Mod G-Codes:     Hulda Humphrey OTR/L [phone]  Merri Ray Joshuajames Moehring 09/12/2017, 5:03 PM

## 2017-09-12 NOTE — Progress Notes (Signed)
PROGRESS NOTE    Erin Charles  YDX:[id number] DOB: [birth year] DOA: 09/10/2017 PCP: Hulan Fess, MD     Brief Narrative:  Erin Charles is a 84 y.o. female with medical history significant of severe aortic stenosis with valve area mean of 0.51 cm on echo on 04/29/2017, paroxysmal atrial fibrillation/flutter complicated by complete heart block status post pacer placement on anticoagulation, dementia, hypertension, anxiety, hyperlipidemia.  She was recently admitted at [facility] for lower extremity cellulitis.  She was treated with IV clindamycin, transition to p.o. doxycycline and discharged on March 7.  She now returns with worsening shortness of breath, lethargy, lower extremity swelling and weeping.  She did have a cardiology appointment, and was recommended to start on Lasix 40 mg daily in the meanwhile, although patient had not yet started this medication yet.  In the emergency department, BNP >4500 and chest x-ray revealed mild to moderate pleural effusions.  Patient was started on IV Lasix and admitted for acute on chronic diastolic heart failure exacerbation.  Assessment & Plan:   Principal Problem:   Acute on chronic diastolic CHF (congestive heart failure) ([facility]) Active Problems:   Hypertension   Pacemaker-St.Jude   Atrial fibrillation ([facility])   Ductal carcinoma in situ (DCIS) of left breast   Mixed hyperlipidemia   Elevated troponin   Dementia without behavioral disturbance   CHF (congestive heart failure) ([facility])   Acute on chronic diastolic heart failure -BNP >4500 -Cardiology consulted  -Lasix 40 mg IV twice daily -Echocardiogram EF 35% with diffuse hypokinesis, severe aortic stenosis  -Strict I's and O's, daily weight  Chronic atrial fibrillation -CHA2DS2VASc 4 -Continue eliquis, toprol  Hypokalemia -Replace, trend  Hypomagnesemia -Replace, trend  Essential hypertension -Continue quinapril  Hyperlipidemia -Continue Zocor  Chronic kidney  disease stage III -Stable, baseline creatinine around 1  History of breast cancer -Continue anastrozole  Dementia without behavioral disturbance -Supportive care     DVT prophylaxis: Eliquis Code Status: DNR Family Communication: No family at bedside Disposition Plan: Pending palliative care discussion, cardiology has recommended home hospice    Consultants:   Cardiology  Palliative care   Procedures:   None   Antimicrobials:  Anti-infectives (From admission, onward)   None       Subjective: No complaints today.  Feeling well without shortness of breath or chest pain.  Objective: Vitals:   09/11/17 2032 09/12/17 0429 09/12/17 0703 09/12/17 1201  BP: 130/81 138/75  117/74  Pulse: 95 66  (!) 53  Resp: 18 18  18   Temp: 98.1 F (36.7 C) 97.6 F (36.4 C)  (!) 97.5 F (36.4 C)  TempSrc: Oral Oral  Oral  SpO2: 99% 98%  100%  Weight:   48.5 kg (106 lb 14.8 oz)   Height:        Intake/Output Summary (Last 24 hours) at 09/12/2017 1254 Last data filed at 09/12/2017 1133 Gross per 24 hour  Intake 680 ml  Output 2300 ml  Net -1620 ml   Filed Weights   09/11/17 0001 09/12/17 0703  Weight: 49.2 kg (108 lb 6.4 oz) 48.5 kg (106 lb 14.8 oz)    Examination: General exam: Appears calm and comfortable  Respiratory system: Clear to auscultation, diminished at bases. Respiratory effort normal. Cardiovascular system: S1 & S2 heard, RRR. +3 systolic murmur, +edema but improved from yesterday's exam  Gastrointestinal system: Abdomen is nondistended, soft and nontender. No organomegaly or masses felt. Normal bowel sounds heard. Central nervous system: Alert. No focal neurological deficits. Extremities: Symmetric  5 x 5 power. Skin: No rashes, lesions or ulcers Psychiatry: Dementia   Data Reviewed: I have personally reviewed following labs and imaging studies  CBC: Recent Labs  Lab 09/07/17 1613 09/10/17 1549 09/11/17 0707  WBC 7.9 7.4 8.2  NEUTROABS 5.9  --   --    HGB 14.2 13.7 14.0  HCT 43.9 42.9 42.3  MCV 82.8 82.5 82.1  PLT 254 255 962   Basic Metabolic Panel: Recent Labs  Lab 09/07/17 1613 09/10/17 1549 09/11/17 0707 09/12/17 0717  NA 139 135 137 139  K 3.9 3.9 3.3* 3.1*  CL 102 101 98* 96*  CO2 27 23 24  32  GLUCOSE 118* 127* 100* 106*  BUN 44* 38* 38* 40*  CREATININE 1.09* 1.12* 1.08* 1.21*  CALCIUM 9.5 9.3 9.1 8.8*  MG  --   --  1.4* 1.4*  PHOS  --   --  3.6  --    GFR: Estimated Creatinine Clearance: 19.8 mL/min (A) (by C-G formula based on SCr of 1.21 mg/dL (H)). Liver Function Tests: Recent Labs  Lab 09/07/17 1613 09/10/17 2108 09/11/17 0707  AST 39 37 35  ALT 48 37 35  ALKPHOS 124 108 104  BILITOT 0.6 0.8 1.0  PROT 5.6* 5.2* 5.2*  ALBUMIN 3.5 3.2* 3.4*   No results for input(s): LIPASE, AMYLASE in the last 168 hours. No results for input(s): AMMONIA in the last 168 hours. Coagulation Profile: No results for input(s): INR, PROTIME in the last 168 hours. Cardiac Enzymes: Recent Labs  Lab 09/10/17 2108 09/11/17 0238 09/11/17 0707  TROPONINI 0.04* 0.05* 0.04*   BNP (last 3 results) No results for input(s): PROBNP in the last 8760 hours. HbA1C: No results for input(s): HGBA1C in the last 72 hours. CBG: No results for input(s): GLUCAP in the last 168 hours. Lipid Profile: No results for input(s): CHOL, HDL, LDLCALC, TRIG, CHOLHDL, LDLDIRECT in the last 72 hours. Thyroid Function Tests: Recent Labs    09/10/17 2108  TSH 1.312   Anemia Panel: No results for input(s): VITAMINB12, FOLATE, FERRITIN, TIBC, IRON, RETICCTPCT in the last 72 hours. Sepsis Labs: No results for input(s): PROCALCITON, LATICACIDVEN in the last 168 hours.  Recent Results (from the past 240 hour(s))  Culture, blood (routine x 2)     Status: None   Collection Time: 09/02/17  8:40 PM  Result Value Ref Range Status   Specimen Description   Final    BLOOD LEFT ANTECUBITAL Performed at [facility], [address]  [place], [place] [address]    Special Requests   Final    BOTTLES DRAWN AEROBIC AND ANAEROBIC Blood Culture adequate volume Performed at [facility], [address] [place], [place] [address]    Culture   Final    NO GROWTH 5 DAYS Performed at [facility], [place] [address], [place], [place] [address]    Report Status 09/07/2017 FINAL  Final  Culture, blood (routine x 2)     Status: None   Collection Time: 09/02/17  8:45 PM  Result Value Ref Range Status   Specimen Description   Final    BLOOD RIGHT ANTECUBITAL Performed at [facility], [address] [place], [place] [address]    Special Requests   Final    BOTTLES DRAWN AEROBIC AND ANAEROBIC Blood Culture adequate volume Performed at [facility], [address] [place], [place] [address]    Culture   Final  NO GROWTH 5 DAYS Performed at [facility], [place] [address], [place], [place] [address]    Report Status 09/07/2017 FINAL  Final  MRSA PCR Screening     Status: None   Collection Time: 09/03/17  3:13 PM  Result Value Ref Range Status   MRSA by PCR NEGATIVE NEGATIVE Final    Comment:        The GeneXpert MRSA Assay (FDA approved for NASAL specimens only), is one component of a comprehensive MRSA colonization surveillance program. It is not intended to diagnose MRSA infection nor to guide or monitor treatment for MRSA infections. Performed at [facility], [place] [address] [place], [place] [address]        Radiology Studies: Dg Chest 2 View  Result Date: 09/10/2017 CLINICAL DATA:  84 y/o F; shortness of breath with fluid leaking from the legs for 1 week. EXAM: CHEST - 2 VIEW COMPARISON:  09/07/2017 chest radiograph FINDINGS: Stable cardiomegaly given projection and technique. Two lead pacemaker. Aortic atherosclerosis with calcification. Stable small to moderate bilateral pleural effusions. Mild interstitial edema. Basilar opacities probably represent  associated atelectasis. No pneumothorax. No acute osseous abnormality is evident. IMPRESSION: Stable small to moderate pleural effusions and basilar atelectasis. Mild interstitial edema. Electronically Signed   By: Kristine Garbe M.D.   On: 09/10/2017 17:43      Scheduled Meds: . anastrozole  1 mg Oral Daily  . apixaban  2.5 mg Oral BID  . feeding supplement  1 Container Oral TID BM  . furosemide  40 mg Intravenous BID  . metoprolol succinate  50 mg Oral Daily  . potassium chloride  40 mEq Oral BID  . quinapril  40 mg Oral BID  . simvastatin  40 mg Oral QHS  . sodium chloride flush  3 mL Intravenous Q12H   Continuous Infusions: . sodium chloride    . magnesium sulfate 1 - 4 g bolus IVPB       LOS: 2 days    Time spent: 20 minutes   Dessa Phi, DO [facility] [email or web address] Password [facility] 09/12/2017, 12:54 PM

## 2017-09-12 NOTE — Progress Notes (Addendum)
Progress Note  Patient Name: Erin Charles Date of Encounter: 09/12/2017  Primary Cardiologist: Mertie Moores, MD  EP: Dr. Lovena Le  Subjective   Denies any CP or SOB. Slept good last night.   Inpatient Medications    Scheduled Meds: . anastrozole  1 mg Oral Daily  . apixaban  2.5 mg Oral BID  . feeding supplement  1 Container Oral TID BM  . furosemide  40 mg Intravenous BID  . metoprolol succinate  50 mg Oral Daily  . quinapril  40 mg Oral BID  . simvastatin  40 mg Oral QHS  . sodium chloride flush  3 mL Intravenous Q12H   Continuous Infusions: . sodium chloride     PRN Meds: sodium chloride, acetaminophen **OR** acetaminophen, ALPRAZolam, HYDROcodone-acetaminophen, ondansetron **OR** ondansetron (ZOFRAN) IV, sodium chloride flush, traMADol, zolpidem   Vital Signs    Vitals:   09/11/17 2032 09/12/17 0429 09/12/17 0703 09/12/17 1201  BP: 130/81 138/75  117/74  Pulse: 95 66  (!) 53  Resp: 18 18  18   Temp: 98.1 F (36.7 C) 97.6 F (36.4 C)  (!) 97.5 F (36.4 C)  TempSrc: Oral Oral  Oral  SpO2: 99% 98%  100%  Weight:   106 lb 14.8 oz (48.5 kg)   Height:        Intake/Output Summary (Last 24 hours) at 09/12/2017 1238 Last data filed at 09/12/2017 1133 Gross per 24 hour  Intake 680 ml  Output 2300 ml  Net -1620 ml   Filed Weights   09/11/17 0001 09/12/17 0703  Weight: 108 lb 6.4 oz (49.2 kg) 106 lb 14.8 oz (48.5 kg)    Telemetry    Paced rhythm with occasional drop of pacing spike followed by significant bradycardia  - Personally Reviewed  ECG    ventricularly paced rhythm - Personally Reviewed  Physical Exam   GEN: No acute distress.   Neck: No JVD Cardiac: RRR, no murmurs, rubs, or gallops.  Respiratory: Clear to auscultation bilaterally. GI: Soft, nontender, non-distended  MS: No edema; No deformity. Neuro:  Nonfocal  Psych: Normal affect   Labs    Chemistry Recent Labs  Lab 09/07/17 1613 09/10/17 1549 09/10/17 2108 09/11/17 0707  09/12/17 0717  NA 139 135  --  137 139  K 3.9 3.9  --  3.3* 3.1*  CL 102 101  --  98* 96*  CO2 27 23  --  24 32  GLUCOSE 118* 127*  --  100* 106*  BUN 44* 38*  --  38* 40*  CREATININE 1.09* 1.12*  --  1.08* 1.21*  CALCIUM 9.5 9.3  --  9.1 8.8*  PROT 5.6*  --  5.2* 5.2*  --   ALBUMIN 3.5  --  3.2* 3.4*  --   AST 39  --  37 35  --   ALT 48  --  37 35  --   ALKPHOS 124  --  108 104  --   BILITOT 0.6  --  0.8 1.0  --   GFRNONAA 42* 41*  --  43* 37*  GFRAA 49* 48*  --  50* 43*  ANIONGAP 10 11  --  15 11     Hematology Recent Labs  Lab 09/07/17 1613 09/10/17 1549 09/11/17 0707  WBC 7.9 7.4 8.2  RBC 5.30* 5.20* 5.15*  HGB 14.2 13.7 14.0  HCT 43.9 42.9 42.3  MCV 82.8 82.5 82.1  MCH 26.8 26.3 27.2  MCHC 32.3 31.9 33.1  RDW 16.2*  16.2* 16.5*  PLT 254 255 231    Cardiac Enzymes Recent Labs  Lab 09/10/17 2108 09/11/17 0238 09/11/17 0707  TROPONINI 0.04* 0.05* 0.04*    Recent Labs  Lab 09/10/17 1616  TROPIPOC 0.03     BNP Recent Labs  Lab 09/07/17 1613 09/10/17 1549  BNP >4,500.0* >4,500.0*     DDimer No results for input(s): DDIMER in the last 168 hours.   Radiology    Dg Chest 2 View  Result Date: 09/10/2017 CLINICAL DATA:  84 y/o F; shortness of breath with fluid leaking from the legs for 1 week. EXAM: CHEST - 2 VIEW COMPARISON:  09/07/2017 chest radiograph FINDINGS: Stable cardiomegaly given projection and technique. Two lead pacemaker. Aortic atherosclerosis with calcification. Stable small to moderate bilateral pleural effusions. Mild interstitial edema. Basilar opacities probably represent associated atelectasis. No pneumothorax. No acute osseous abnormality is evident. IMPRESSION: Stable small to moderate pleural effusions and basilar atelectasis. Mild interstitial edema. Electronically Signed   By: Kristine Garbe M.D.   On: 09/10/2017 17:43    Cardiac Studies   Echo 09/11/2017 LV EF: 35% - Study Conclusions  - Procedure narrative:  Transthoracic echocardiography. Image   quality was adequate. The study was technically difficult. - Left ventricle: The cavity size was normal. Wall thickness was   increased in a pattern of mild LVH. Septal-lateral dyssynchrony.   The estimated ejection fraction was 35%. Diffuse hypokinesis.   Features are consistent with a pseudonormal left ventricular   filling pattern, with concomitant abnormal relaxation and   increased filling pressure (grade 2 diastolic dysfunction). - Aortic valve: Probably trileaflet; severely calcified leaflets.   There was severe stenosis. There was mild regurgitation. Mean   gradient (S): 38 mm Hg. Valve area (VTI): 0.44 cm^2. - Mitral valve: Moderately calcified annulus. Mildly calcified   leaflets . There was moderate regurgitation. - Left atrium: The atrium was mildly dilated. - Right ventricle: Pacer wire or catheter noted in right ventricle.   Systolic function was mildly reduced. - Right atrium: The atrium was mildly dilated. - Tricuspid valve: Peak RV-RA gradient (S): 52 mm Hg. - Systemic veins: IVC not visualized.  Impressions:  - Normal LV size with mild LV hypertrophy. EF 35%, diffuse   hypokinesis with septal-lateral dyssynchrony. Normal RV size with   mildly decreased systolic function. Calcified mitral valve with   moderate mitral regurgitation. Severe aortic stenosis with mild   insufficiency. Moderate pulmonary hypertension.  Patient Profile     84 y.o. female with PMH of severe AS declined TAVR, mild to moderate MR 03/2017, mild nonobstructive CAD by cath 2016, CHB s/p St Jude PPM 2012, high grade breast ductal cancer in situ (not a candidate for surgery, treated with anastrazole), PAF on eliquis, RBBB, CKD stage III, and dementia who was admitted with CHF. Echo showed new LV dysfunction with EF 35% when compare to normal EF seen on previous echo 04/29/2017, patient still does not want surgery and understand poor prognosis.  Assessment &  Plan    1. Acute on chronic combined systolic and diastolic heart failure: likely driven by her severe AS, no plan for surgery at this time.   - Echo 09/11/2017 showed EF down to 35% from previous echo 03/2017  - on IV Lasix 40mg  BID, diuresed 2.9 L. Weight down 2 lbs to 106.   - Cr trended up slightly, physical exam consistent with R pleural effusion.   - given her critical AS, would avoid overdiuresis due to potential significant drop in  renal perfusion. Consider transition her to PO lasix 40mg  daily starting tomorrow.   2. CHB s/p St Jude PPM 2012: occasional bradycardia on telemetry. MD to review telemetry  3. Severe AS: declined surgery, palliative care consulted  4. PAF on eliquis  5. CKD stage III  6. Hypokalemia: K 3.1 today, potassium supplement ordered by Internal medicine    For questions or updates, please contact Lena Please consult [email or web address] for contact info under Cardiology/STEMI.      Hilbert Corrigan, PA  09/12/2017, 12:38 PM    I have seen and examined the patient along with Almyra Deforest, PA .  I have reviewed the chart, notes and new data.  I agree with PA's note.  Key new complaints: she is "alright", does not appear dyspneic Key examination changes: JVP still markedly elevated, diminished carotid pulses, late peaking soft AS murmur, soft S2 Key new findings / data: bradycardia on telemetry yesterday around 3-4 PM occurred during pacemaker testing of underlying rhythm (30s) and V threshold testing. No true bradycardia outside of device interrogation.  PLAN: Continue diuresis, watching for signs of low cardiac output. Awaiting palliative care consultation.  Sanda Klein, MD, [place] [phone] 09/12/2017, 3:17 PM

## 2017-09-12 NOTE — Progress Notes (Signed)
Palliative Medicine consult noted. Due to high referral volume, there may be a delay seeing this patient. Please call the Palliative Medicine Team office at [phone] if recommendations are needed in the interim.  Thank you for inviting [place] to see this patient.  Marjie Skiff Chalon Zobrist, RN, BSN, [facility] Palliative Medicine Team 09/12/2017 7:57 AM Office [phone]

## 2017-09-13 LAB — BASIC METABOLIC PANEL
Anion gap: 13 (ref 5–15)
BUN: 42 mg/dL — AB (ref 6–20)
CALCIUM: 9.1 mg/dL (ref 8.9–10.3)
CHLORIDE: 93 mmol/L — AB (ref 101–111)
CO2: 32 mmol/L (ref 22–32)
CREATININE: 1.1 mg/dL — AB (ref 0.44–1.00)
GFR calc non Af Amer: 42 mL/min — ABNORMAL LOW (ref >60)
GFR, EST AFRICAN AMERICAN: 49 mL/min — AB (ref >60)
Glucose, Bld: 95 mg/dL (ref 65–99)
Potassium: 3.9 mmol/L (ref 3.5–5.1)
Sodium: 138 mmol/L (ref 135–145)

## 2017-09-13 LAB — MAGNESIUM: Magnesium: 2.3 mg/dL (ref 1.7–2.4)

## 2017-09-13 NOTE — Progress Notes (Signed)
PROGRESS NOTE    Erin Charles  MWU:[id number] DOB: 11/25/1922 DOA: 09/10/2017 PCP: Hulan Fess, MD     Brief Narrative:  Erin Charles is a 84 y.o. female with medical history significant of severe aortic stenosis with valve area mean of 0.51 cm on echo on 04/29/2017, paroxysmal atrial fibrillation/flutter complicated by complete heart block status post pacer placement on anticoagulation, dementia, hypertension, anxiety, hyperlipidemia.  She was recently admitted at [facility] for lower extremity cellulitis.  She was treated with IV clindamycin, transition to p.o. doxycycline and discharged on March 7.  She now returns with worsening shortness of breath, lethargy, lower extremity swelling and weeping.  She did have a cardiology appointment, and was recommended to start on Lasix 40 mg daily in the meanwhile, although patient had not yet started this medication yet.  In the emergency department, BNP >4500 and chest x-ray revealed mild to moderate pleural effusions.  Patient was started on IV Lasix and admitted for acute on chronic diastolic heart failure exacerbation.  Assessment & Plan:   Principal Problem:   Acute on chronic diastolic CHF (congestive heart failure) (HCC) Active Problems:   Hypertension   Pacemaker-St.Jude   Aortic valve stenosis, nonrheumatic   Atrial fibrillation (HCC)   Goals of care, counseling/discussion   Ductal carcinoma in situ (DCIS) of left breast   Mixed hyperlipidemia   Elevated troponin   Dementia without behavioral disturbance   CHF (congestive heart failure) (HCC)   Tachycardia-bradycardia syndrome ([place])   Palliative care by specialist   Acute on chronic diastolic heart failure -BNP >4500 -Cardiology consulted  -Continue lasix  -Echocardiogram EF 35% with diffuse hypokinesis, severe aortic stenosis  -Strict I's and O's, daily weight  Chronic atrial fibrillation -CHA2DS2VASc 4 -Continue eliquis, toprol  Essential  hypertension -Continue quinapril  Hyperlipidemia -Continue Zocor  Chronic kidney disease stage III -Stable, baseline creatinine around 1  History of breast cancer -Continue anastrozole  Dementia without behavioral disturbance -Supportive care     DVT prophylaxis: Eliquis Code Status: DNR Family Communication: No family at bedside Disposition Plan: Palliative care consultation reviewed. Home hospice being arranged, hopefully can discharge 3/17    Consultants:   Cardiology  Palliative care   Procedures:   None   Antimicrobials:  Anti-infectives (From admission, onward)   None       Subjective: Doing well today. She has no acute complaints.   Objective: Vitals:   09/12/17 1201 09/12/17 2115 09/12/17 2309 09/13/17 0514  BP: 117/74 (!) 109/46 (!) 116/42 (!) 142/67  Pulse: (!) 53 (!) 59 60 67  Resp: 18 18 16 17   Temp: (!) 97.5 F (36.4 C) (!) 97.5 F (36.4 C)  97.7 F (36.5 C)  TempSrc: Oral Oral  Oral  SpO2: 100% 98% 96% 97%  Weight:    45 kg (99 lb 4.8 oz)  Height:        Intake/Output Summary (Last 24 hours) at 09/13/2017 1401 Last data filed at 09/13/2017 0949 Gross per 24 hour  Intake 1087 ml  Output 1225 ml  Net -138 ml   Filed Weights   09/11/17 0001 09/12/17 0703 09/13/17 0514  Weight: 49.2 kg (108 lb 6.4 oz) 48.5 kg (106 lb 14.8 oz) 45 kg (99 lb 4.8 oz)    Examination: General exam: Appears calm and comfortable  Respiratory system: Clear to auscultation. Respiratory effort normal. Cardiovascular system: S1 & S2 heard, RRR. +3 murmur, +trace edema  Gastrointestinal system: Abdomen is nondistended, soft and nontender. No organomegaly or  masses felt. Normal bowel sounds heard. Central nervous system: Alert. No focal neurological deficits. Extremities: Symmetric 5 x 5 power. Skin: No rashes, lesions or ulcers Psychiatry: +Dementia    Data Reviewed: I have personally reviewed following labs and imaging studies  CBC: Recent Labs  Lab  09/07/17 1613 09/10/17 1549 09/11/17 0707  WBC 7.9 7.4 8.2  NEUTROABS 5.9  --   --   HGB 14.2 13.7 14.0  HCT 43.9 42.9 42.3  MCV 82.8 82.5 82.1  PLT 254 255 017   Basic Metabolic Panel: Recent Labs  Lab 09/07/17 1613 09/10/17 1549 09/11/17 0707 09/12/17 0717 09/13/17 0516  NA 139 135 137 139 138  K 3.9 3.9 3.3* 3.1* 3.9  CL 102 101 98* 96* 93*  CO2 27 23 24  32 32  GLUCOSE 118* 127* 100* 106* 95  BUN 44* 38* 38* 40* 42*  CREATININE 1.09* 1.12* 1.08* 1.21* 1.10*  CALCIUM 9.5 9.3 9.1 8.8* 9.1  MG  --   --  1.4* 1.4* 2.3  PHOS  --   --  3.6  --   --    GFR: Estimated Creatinine Clearance: 21.8 mL/min (A) (by C-G formula based on SCr of 1.1 mg/dL (H)). Liver Function Tests: Recent Labs  Lab 09/07/17 1613 09/10/17 2108 09/11/17 0707  AST 39 37 35  ALT 48 37 35  ALKPHOS 124 108 104  BILITOT 0.6 0.8 1.0  PROT 5.6* 5.2* 5.2*  ALBUMIN 3.5 3.2* 3.4*   No results for input(s): LIPASE, AMYLASE in the last 168 hours. No results for input(s): AMMONIA in the last 168 hours. Coagulation Profile: No results for input(s): INR, PROTIME in the last 168 hours. Cardiac Enzymes: Recent Labs  Lab 09/10/17 2108 09/11/17 0238 09/11/17 0707  TROPONINI 0.04* 0.05* 0.04*   BNP (last 3 results) No results for input(s): PROBNP in the last 8760 hours. HbA1C: No results for input(s): HGBA1C in the last 72 hours. CBG: No results for input(s): GLUCAP in the last 168 hours. Lipid Profile: No results for input(s): CHOL, HDL, LDLCALC, TRIG, CHOLHDL, LDLDIRECT in the last 72 hours. Thyroid Function Tests: Recent Labs    09/10/17 2108  TSH 1.312   Anemia Panel: No results for input(s): VITAMINB12, FOLATE, FERRITIN, TIBC, IRON, RETICCTPCT in the last 72 hours. Sepsis Labs: No results for input(s): PROCALCITON, LATICACIDVEN in the last 168 hours.  Recent Results (from the past 240 hour(s))  MRSA PCR Screening     Status: None   Collection Time: 09/03/17  3:13 PM  Result Value Ref  Range Status   MRSA by PCR NEGATIVE NEGATIVE Final    Comment:        The GeneXpert MRSA Assay (FDA approved for NASAL specimens only), is one component of a comprehensive MRSA colonization surveillance program. It is not intended to diagnose MRSA infection nor to guide or monitor treatment for MRSA infections. Performed at [facility], [place] [address], [place], [place] [address]        Radiology Studies: No results found.    Scheduled Meds: . anastrozole  1 mg Oral Daily  . apixaban  2.5 mg Oral BID  . feeding supplement  1 Container Oral TID BM  . furosemide  40 mg Intravenous BID  . metoprolol succinate  50 mg Oral Daily  . quinapril  40 mg Oral BID  . simvastatin  40 mg Oral QHS  . sodium chloride flush  3 mL Intravenous Q12H   Continuous Infusions: . sodium chloride  LOS: 3 days    Time spent: 20 minutes   Dessa Phi, DO [facility] [email or web address] Password [email or web address] 09/13/2017, 2:01 PM

## 2017-09-13 NOTE — Plan of Care (Signed)
  Elimination: Will not experience complications related to bowel motility 09/13/2017 0738 - Completed/Met by Evert Kohl, RN   Pain Managment: General experience of comfort will improve 09/13/2017 0735 - Completed/Met by Evert Kohl, RN   Safety: Ability to remain free from injury will improve 09/13/2017 0735 - Completed/Met by Evert Kohl, RN   Skin Integrity: Risk for impaired skin integrity will decrease 09/13/2017 0738 - Completed/Met by Evert Kohl, RN

## 2017-09-13 NOTE — Progress Notes (Signed)
Patient resting comfortably during shift report. Denies complaints.  

## 2017-09-13 NOTE — Plan of Care (Signed)
  Pain Managment: General experience of comfort will improve 09/13/2017 0735 - Completed/Met by Evert Kohl, RN   Safety: Ability to remain free from injury will improve 09/13/2017 0735 - Completed/Met by [place], Sophronia Simas, RN

## 2017-09-13 NOTE — Progress Notes (Signed)
Patient is from [facility] as prior to admission; Judson Roch SW following for disposition needs; Aneta Mins [phone]

## 2017-09-13 NOTE — Progress Notes (Signed)
Discovered pt's husband has been drinking her water. Educated the family on the strict I&O for patient's on diuretics. Family expresses understanding, husband provided a drink of his own, labeled "his".  Palliative NP at bedside at this time.

## 2017-09-13 NOTE — Progress Notes (Signed)
Subjective:  Currently being seen by palliative care.  No complaints of shortness of breath or chest pain.  Renal function improved and potassium up some today.  Objective:  Vital Signs in the last 24 hours: BP (!) 142/67 (BP Location: Right Arm)   Pulse 67   Temp 97.7 F (36.5 C) (Oral)   Resp 17   Ht 4\' 11"  (1.499 m)   Wt 45 kg (99 lb 4.8 oz)   SpO2 97%   BMI 20.06 kg/m   Physical Exam: Pleasant elderly woman in no acute distress Lungs:  Clear  Cardiac:  Regular rhythm, normal S1 harsh 2/6 systolic murmur of aortic stenosis with diminished S2 Extremities:  No edema present  Intake/Output from previous day: 03/15 0701 - 03/16 0700 In: 1157 [P.O.:1157] Out: 2025 [Urine:2025] Weight Filed Weights   09/11/17 0001 09/12/17 0703 09/13/17 0514  Weight: 49.2 kg (108 lb 6.4 oz) 48.5 kg (106 lb 14.8 oz) 45 kg (99 lb 4.8 oz)    Lab Results: Basic Metabolic Panel: Recent Labs    09/12/17 0717 09/13/17 0516  NA 139 138  K 3.1* 3.9  CL 96* 93*  CO2 32 32  GLUCOSE 106* 95  BUN 40* 42*  CREATININE 1.21* 1.10*    CBC: Recent Labs    09/10/17 1549 09/11/17 0707  WBC 7.4 8.2  HGB 13.7 14.0  HCT 42.9 42.3  MCV 82.5 82.1  PLT 255 231    BNP    Component Value Date/Time   BNP >4,500.0 (H) 09/10/2017 1549    Cardiac Panel (last 3 results) Recent Labs    09/10/17 2108 09/11/17 0238 09/11/17 0707  TROPONINI 0.04* 0.05* 0.04*    PROTIME: Lab Results  Component Value Date   INR 1.34 09/02/2017   INR 1.1 (H) 08/05/2014   INR 1.4 04/07/2013    Telemetry: Paced rhythm  Assessment/Plan:  1.  Severe left AS currently on palliative care 2.  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  Complete heart block with permanent pacemaker 4. Chronic kidney disease pattern 5.  Paroxysmal atrial fibrillation on Eliquis  Recommendations:  Awaiting palliative care recommendations.  Probably would be able to be discharged tomorrow.  Will start oral furosemide  today.     Kerry Hough  MD [facility]  09/13/2017, 12:18 PM

## 2017-09-13 NOTE — Progress Notes (Signed)
Daily Progress Note   Patient Name: Erin Charles       Date: 09/13/2017 DOB: [birth year]  Age: 84 y.o. MRN#: [id number] Attending Physician: Dessa Phi, DO Primary Care Physician: Hulan Fess, MD Admit Date: 09/10/2017  Reason for Consultation/Follow-up: Establishing goals of care, Hospice Evaluation and Psychosocial/spiritual support  Subjective: Patient seen, chart reviewed.  Met with patient, her husband, her daughter and sons via telephone conference.  Patient is verbalizing feeling better, less short of breath.  She has mild dementia, short-term memory deficits are noted, thus, poor understanding of full clinical condition.    Length of Stay: 3  Current Medications: Scheduled Meds:  . anastrozole  1 mg Oral Daily  . apixaban  2.5 mg Oral BID  . feeding supplement  1 Container Oral TID BM  . furosemide  40 mg Intravenous BID  . metoprolol succinate  50 mg Oral Daily  . quinapril  40 mg Oral BID  . simvastatin  40 mg Oral QHS  . sodium chloride flush  3 mL Intravenous Q12H    Continuous Infusions: . sodium chloride      PRN Meds: sodium chloride, acetaminophen **OR** acetaminophen, ALPRAZolam, HYDROcodone-acetaminophen, ondansetron **OR** ondansetron (ZOFRAN) IV, sodium chloride flush, traMADol, zolpidem  Physical Exam  Constitutional:  Frail elderly female; no acute distress  Cardiovascular: Normal rate.  Pulmonary/Chest:  Shortness of breath with exertion Appears comfortable at rest  Neurological: She is alert.  Oriented to person, understands that she is in the hospital; verbalizing readiness to go home  Skin: Skin is warm and dry. There is pallor.  Psychiatric: She has a normal mood and affect.  Short-term memory deficits noted  Nursing note and vitals  reviewed.           Vital Signs: BP (!) 142/67 (BP Location: Right Arm)   Pulse 67   Temp 97.7 F (36.5 C) (Oral)   Resp 17   Ht '4\' 11"'  (1.499 m)   Wt 45 kg (99 lb 4.8 oz)   SpO2 97%   BMI 20.06 kg/m  SpO2: SpO2: 97 % O2 Device: O2 Device: Room Air O2 Flow Rate:    Intake/output summary:   Intake/Output Summary (Last 24 hours) at 09/13/2017 1329 Last data filed at 09/13/2017 0949 Gross per 24 hour  Intake 1327 ml  Output 1325 ml  Net 2 ml   LBM: Last BM Date: 09/12/17 Baseline Weight: Weight: 49.2 kg (108 lb 6.4 oz) Most recent weight: Weight: 45 kg (99 lb 4.8 oz)       Palliative Assessment/Data:    Flowsheet Rows     Most Recent Value  Intake Tab  Referral Department  Hospitalist  Unit at Time of Referral  Cardiac/Telemetry Unit  Palliative Care Primary Diagnosis  Cardiac  Date Notified  09/11/17  Palliative Care Type  New Palliative care  Reason for referral  Clarify Goals of Care  Date of Admission  09/10/17  Date first seen by Palliative Care  09/12/17  # of days Palliative referral response time  1 Day(s)  # of days IP prior to Palliative referral  1  Clinical Assessment  Palliative Performance Scale Score  50%  Pain Max last 24 hours  Not able to report  Pain Min Last 24 hours  Not able to report  Dyspnea Max Last 24 Hours  Not able to report  Dyspnea Min Last 24 hours  Not able to report  Nausea Max Last 24 Hours  Not able to report  Nausea Min Last 24 Hours  Not able to report  Anxiety Max Last 24 Hours  Not able to report  Anxiety Min Last 24 Hours  Not able to report  Other Max Last 24 Hours  Not able to report  Psychosocial & Spiritual Assessment  Palliative Care Outcomes  Patient/Family meeting held?  Yes  Who was at the meeting?  pt and husband  Patient/Family wishes: Interventions discontinued/not started   Mechanical Ventilation, Vasopressors, Trach  Palliative Care follow-up planned  Yes, Facility      Patient Active Problem List    Diagnosis Date Noted  . Tachycardia-bradycardia syndrome ([place])   . Palliative care by specialist   . Acute on chronic diastolic CHF (congestive heart failure) ([place]) 09/10/2017  . CHF (congestive heart failure) ([place]) 09/10/2017  . Acute renal insufficiency 09/03/2017  . Cellulitis 09/02/2017  . Urinary retention   . Physical deconditioning   . Dementia without behavioral disturbance   . Anxiety   . Hypokalemia   . Hyponatremia 04/28/2017  . Back pain 04/28/2017  . Elevated troponin 04/28/2017  . Mixed hyperlipidemia 08/13/2016  . Ductal carcinoma in situ (DCIS) of left breast 07/21/2015  . Traumatic ecchymosis of groin 08/18/2014  . Trochanteric bursitis of right hip 03/03/2014  . Osteoarthritis of hip 06/07/2013  . DJD (degenerative joint disease) of knee 06/07/2013  . Goals of care, counseling/discussion 06/07/2013  . Spinal stenosis, lumbar region, without neurogenic claudication 06/07/2013  . Atrial fibrillation ([place]) 03/30/2013  . Atrioventricular block, complete ([place]) 12/12/2011  . Hypertension 09/27/2010  . Pacemaker-St.Jude 09/27/2010  . Aortic valve stenosis, nonrheumatic 09/27/2010    Palliative Care Assessment & Plan   Patient Profile:  84 y.o. female  with past medical history of severe aortic stenosis, atrial fib, complete heart block with pacemaker insertion, dementia, hypertension, hyperlipidemia, admitted on 09/10/2017 with shortness of breath, leg edema.  Patient and her husband reside at assisted living facility, [facility].  Per chest x-ray patient had mild to moderate pleural effusions; BNP was greater than 4500  Consult ordered for goals of care and to discuss hospice support in the home.    Assessment: Updated family as to current clinical condition specifically patient's severe aortic stenosis, change in echocardiogram from 2018 to now new LVH, EF of 35%.  We also discussed hospice support at the assisted living facility.  Family has engaged hospice  services in the past and have had a positive experience and are receptive to this going forward  Recommendations/Plan:  Home with hospice coming to the facility, [facility].  Offered choice per Medicare guidelines.  Family has elected hospice of the [place].  Called hospice of the [facility] liaison directly on 09/13/2017 and arranged referral  Consult placed to care management per hospital policy  Recommend that patient be formally affiliated with hospice before being discharged.  Hospice wil be able to admit over the weekend  Goals of Care and Additional Recommendations:  Limitations on Scope of Treatment: Avoid Hospitalization, No Artificial Feeding, No Chemotherapy, No Hemodialysis, No Radiation, No Surgical Procedures and No Tracheostomy  Code Status:    Code Status Orders  (From admission, onward)        Start     Ordered   09/10/17 2353  Do not attempt resuscitation (DNR)  Continuous    Question Answer Comment  In the event of cardiac or respiratory ARREST Do not call a "code blue"   In the event of cardiac or respiratory ARREST Do not perform Intubation, CPR, defibrillation or ACLS   In the event of cardiac or respiratory ARREST Use medication by any route, position, wound care, and other measures to relive pain and suffering. May use oxygen, suction and manual treatment of airway obstruction as needed for comfort.      09/10/17 2352    Code Status History    Date Active Date Inactive Code Status Order ID Comments User Context   09/03/2017 19:56 09/04/2017 18:29 DNR [id number]  Cristy Folks, MD Inpatient   04/29/2017 04:48 05/02/2017 14:33 Full Code [id number]  Jani Gravel, MD Inpatient    Advance Directive Documentation     Most Recent Value  Type of Advance Directive  Living will  Pre-existing out of facility DNR order (yellow form or pink MOST form)  No data  "MOST" Form in Place?  No data       Prognosis:   < 6 months in the setting of  severe aortic stenosis, new LVH with depressed ejection fraction now of 35%, complete heart block history with pacemaker (no AICD), atrial fib, dementia  Discharge Planning:  Home with Hospice    Thank you for allowing the Palliative Medicine Team to assist in the care of this patient.   Time In: 1200 Time Out: 1235 Total Time 35 min Prolonged Time Billed  no       Greater than 50%  of this time was spent counseling and coordinating care related to the above assessment and plan.  Dory Horn, NP  Please contact Palliative Medicine Team phone at [phone] for questions and concerns.

## 2017-09-14 LAB — BASIC METABOLIC PANEL
ANION GAP: 12 (ref 5–15)
BUN: 38 mg/dL — ABNORMAL HIGH (ref 6–20)
CALCIUM: 9.1 mg/dL (ref 8.9–10.3)
CO2: 32 mmol/L (ref 22–32)
Chloride: 95 mmol/L — ABNORMAL LOW (ref 101–111)
Creatinine, Ser: 1.3 mg/dL — ABNORMAL HIGH (ref 0.44–1.00)
GFR calc Af Amer: 40 mL/min — ABNORMAL LOW (ref >60)
GFR calc non Af Amer: 34 mL/min — ABNORMAL LOW (ref >60)
GLUCOSE: 100 mg/dL — AB (ref 65–99)
POTASSIUM: 3.5 mmol/L (ref 3.5–5.1)
Sodium: 139 mmol/L (ref 135–145)

## 2017-09-14 LAB — MAGNESIUM: Magnesium: 1.7 mg/dL (ref 1.7–2.4)

## 2017-09-14 MED ORDER — FUROSEMIDE 40 MG PO TABS: 40.0000 mg | ORAL_TABLET | Freq: Two times a day (BID) | ORAL | 0 refills | Status: AC

## 2017-09-14 MED ORDER — FUROSEMIDE 40 MG PO TABS
40.0000 mg | ORAL_TABLET | Freq: Two times a day (BID) | ORAL | Status: DC
Start: 2017-09-14 — End: 2017-09-14

## 2017-09-14 NOTE — Progress Notes (Signed)
Subjective:    Objective:  Vital Signs in the last 24 hours: BP (!) 121/57 (BP Location: Right Arm)   Pulse 88   Temp 97.6 F (36.4 C) (Oral)   Resp 20   Ht 4\' 11"  (1.499 m)   Wt 44 kg (97 lb) Comment: scale a  SpO2 94%   BMI 19.59 kg/m   Physical Exam: Pleasant elderly woman in no acute distress Lungs:  Clear  Cardiac:  Regular rhythm, normal S1 harsh 2/6 systolic murmur of aortic stenosis with diminished S2 Extremities:  No edema present  Intake/Output from previous day: 03/16 0701 - 03/17 0700 In: 1309 [P.O.:1299; I.V.:10] Out: 1300 [Urine:1300] Weight Filed Weights   09/12/17 0703 09/13/17 0514 09/14/17 0526  Weight: 48.5 kg (106 lb 14.8 oz) 45 kg (99 lb 4.8 oz) 44 kg (97 lb)    Lab Results: Basic Metabolic Panel: Recent Labs    09/13/17 0516 09/14/17 0500  NA 138 139  K 3.9 3.5  CL 93* 95*  CO2 32 32  GLUCOSE 95 100*  BUN 42* 38*  CREATININE 1.10* 1.30*    BNP    Component Value Date/Time   BNP >4,500.0 (H) 09/10/2017 1549    Telemetry: Paced rhythm  Assessment/Plan:  1.  Severe left AS currently on palliative care 2.  Systolic heart failure likely due to AS 3.  Complete heart block with permanent pacemaker 4. Chronic kidney disease pattern 5.  Paroxysmal atrial fibrillation on Eliquis  Recommendations:  Changed to furosemide 40 mg twice daily on discharge.  Continue Eliquis and home hospice.    Kerry Hough  MD [facility]  09/14/2017, 11:13 AM

## 2017-09-14 NOTE — Progress Notes (Signed)
Confirmed with SW and MD, as well as family, that preferred and approved method of transportation is for family to bring pt back to [facility].

## 2017-09-14 NOTE — Progress Notes (Signed)
Patient resting comfortably during shift report. Assisted to bedside commode, and back to bed. Bed alarm on.

## 2017-09-14 NOTE — Plan of Care (Signed)
  Elimination: Will not experience complications related to urinary retention 09/14/2017 0130 - Completed/Met by Evert Kohl, RN

## 2017-09-14 NOTE — Progress Notes (Signed)
Contact information for Jolyn Nap  [phone]

## 2017-09-14 NOTE — Care Management Note (Signed)
Case Management Note Original Note Created Sherrilyn Rist [phone]   Patient Details  Name: Erin Charles MRN: [id number] Date of Birth: [birth year]  Subjective/Objective:    CHF               Action/Plan: Patient resides at [facility]. SW aware; CM following for progression of care.     Expected Discharge Date:    possibly 09/15/17             Expected Discharge Plan:  Assisted Living / Rest Home  In-House Referral:  Clinical Social Work  Discharge planning Services  CM Consult  Post Acute Care Choice:  Hospice Choice offered to:  Patient, Adult Children, Spouse  DME Arranged:    DME Agency:     HH Arranged:  Disease Management [place] Agency:  [facility]  Status of Service:  Completed, signed off  If discussed at [employer] of Stay Meetings, dates discussed:    Discharge Disposition: Assisted Living with Hospice   Additional Comments:  09/14/17- 13- [name] RN, CM- noted referral per PC to [place] for home hospice on pt's return to [facility]. Referral has been completed and representative from [place] has met with pt and family- and pt has been accepted into hospice services- [facility]. CSW will follow pt for return to [place] with Hospice services.   [name], RN 09/14/2017, 11:49 AM Weekend Coverage for [employer] [phone]

## 2017-09-14 NOTE — Discharge Summary (Signed)
Physician Discharge Summary  Erin Charles:[id number] DOB: 01-01-1923 DOA: 09/10/2017  PCP: Hulan Fess, MD  Admit date: 09/10/2017 Discharge date: 09/14/2017  Admitted From: ALF Disposition:  ALF with hospice   Recommendations for Outpatient Follow-up:  1. Follow up with PCP in 1 week  Discharge Condition: Stable CODE STATUS: DNR  Diet recommendation: Heart healthy   Brief/Interim Summary: Erin Charles is a 84 y.o.femalewith medical history significant of severe aortic stenosis with valve area mean of 0.51 cm on echo on 04/29/2017, paroxysmal atrial fibrillation/flutter complicated by complete heart block status post pacer placement on anticoagulation, dementia, hypertension, anxiety, hyperlipidemia.  She was recently admitted at [facility] for lower extremity cellulitis.  She was treated with IV clindamycin, transition to p.o. doxycycline and discharged on March 7.  She now returns with worsening shortness of breath, lethargy, lower extremity swelling and weeping.  She did have a cardiology appointment, and was recommended to start on Lasix 40 mg daily in the meanwhile, although patient had not yet started this medication yet.  In the emergency department, BNP >4500 and chest x-ray revealed mild to moderate pleural effusions.  Patient was started on IV Lasix and admitted for acute on chronic diastolic heart failure exacerbation.  She had a repeat echocardiogram which revealed EF 35% with diffuse hypokinesis, severe aortic stenosis.  Cardiology was consulted.  Due to her severe aortic stenosis, medical comorbidities, age, patient declining further surgical evaluation, they recommended palliative care and hospice referral.  Patient was evaluated by home hospice services.  She will discharge back to her assisted living facility with home hospice to follow.  Discharge Diagnoses:  Principal Problem:   Acute on chronic diastolic CHF (congestive heart failure) (HCC) Active  Problems:   Hypertension   Pacemaker-[employer]ude   Aortic valve stenosis, nonrheumatic   Atrial fibrillation (HCC)   Goals of care, counseling/discussion   Ductal carcinoma in situ (DCIS) of left breast   Mixed hyperlipidemia   Elevated troponin   Dementia without behavioral disturbance   CHF (congestive heart failure) (HCC)   Tachycardia-bradycardia syndrome ([place])   Palliative care by specialist   Acute on chronic diastolic heart failure -BNP >4500 -Cardiology consulted  -Echocardiogram EF 35% with diffuse hypokinesis, severe aortic stenosis  -Strict I's and O's, daily weight -Continue lasix 40mg  BID PO   Chronic atrial fibrillation -CHA2DS2VASc 4 -Continue eliquis, toprol  Essential hypertension -Continue quinapril  Hyperlipidemia -Continue Zocor  Chronic kidney disease stage III -Stable, baseline creatinine around 1  History of breast cancer -Continue anastrozole  Dementia without behavioral disturbance -Supportive care     Discharge Instructions  Discharge Instructions    (HEART FAILURE PATIENTS) Call MD:  Anytime you have any of the following symptoms: 1) 3 pound weight gain in 24 hours or 5 pounds in 1 week 2) shortness of breath, with or without a dry hacking cough 3) swelling in the hands, feet or stomach 4) if you have to sleep on extra pillows at night in order to breathe.   Complete by:  As directed    Call MD for:  difficulty breathing, headache or visual disturbances   Complete by:  As directed    Call MD for:  extreme fatigue   Complete by:  As directed    Call MD for:  hives   Complete by:  As directed    Call MD for:  persistant dizziness or light-headedness   Complete by:  As directed    Call MD for:  persistant nausea  and vomiting   Complete by:  As directed    Call MD for:  severe uncontrolled pain   Complete by:  As directed    Call MD for:  temperature >100.4   Complete by:  As directed    Diet - low sodium heart healthy   Complete  by:  As directed    Discharge instructions   Complete by:  As directed    You were cared for by a hospitalist during your hospital stay. If you have any questions about your discharge medications or the care you received while you were in the hospital after you are discharged, you can call the unit and asked to speak with the hospitalist on call if the hospitalist that took care of you is not available. Once you are discharged, your primary care physician will handle any further medical issues. Please note that NO REFILLS for any discharge medications will be authorized once you are discharged, as it is imperative that you return to your primary care physician (or establish a relationship with a primary care physician if you do not have one) for your aftercare needs so that they can reassess your need for medications and monitor your lab values.   Increase activity slowly   Complete by:  As directed      Allergies as of 09/14/2017      Reactions   Macrodantin Rash   Penicillins Rash, Other (See Comments)   Many years ago. Has patient had a PCN reaction causing immediate rash, facial/tongue/throat swelling, SOB or lightheadedness with hypotension: unknown Has patient had a PCN reaction causing severe rash involving mucus membranes or skin necrosis: unknown Has patient had a PCN reaction that required hospitalization: unknown Has patient had a PCN reaction occurring within the last 10 years: No If all of the above answers are "NO", then may proceed with Cephalosporin use.   Tolectin [tolmetin Sodium] Rash      Medication List    STOP taking these medications   doxycycline 100 MG tablet Commonly known as:  VIBRA-TABS   hydrochlorothiazide 12.5 MG tablet Commonly known as:  HYDRODIURIL     TAKE these medications   acetaminophen 325 MG tablet Commonly known as:  TYLENOL Take 650 mg by mouth every 8 (eight) hours as needed for mild pain.   ALPRAZolam 0.25 MG tablet Commonly known as:   XANAX Take 1 tablet (0.25 mg total) by mouth every 8 (eight) hours as needed for anxiety.   anastrozole 1 MG tablet Commonly known as:  ARIMIDEX Take 1 tablet (1 mg total) daily by mouth.   ELIQUIS 2.5 MG Tabs tablet Generic drug:  apixaban TAKE 1 TABLET BY MOUTH TWICE A DAY   furosemide 40 MG tablet Commonly known as:  LASIX Take 1 tablet (40 mg total) by mouth 2 (two) times daily.   lactobacillus acidophilus & bulgar chewable tablet Chew 1 tablet by mouth 3 (three) times daily with meals.   metoprolol succinate 50 MG 24 hr tablet Commonly known as:  TOPROL-XL TAKE 1 TABLET BY MOUTH DAILY   NITROSTAT 0.4 MG SL tablet Generic drug:  nitroGLYCERIN PLACE 1 TABLET (0.4 MG TOTAL) UNDER THE TONGUE EVERY 5 (FIVE) MINUTES AS NEEDED FOR CHEST PAIN.   potassium chloride 10 MEQ tablet Commonly known as:  K-DUR,KLOR-CON Take 10 mEq by mouth daily.   quinapril 40 MG tablet Commonly known as:  ACCUPRIL Take 40 mg by mouth 2 (two) times daily.   simvastatin 40 MG tablet Commonly known  as:  ZOCOR Take 40 mg by mouth at bedtime.   traMADol 50 MG tablet Commonly known as:  ULTRAM Take 1 tablet (50 mg total) by mouth every 8 (eight) hours as needed for moderate pain or severe pain (not relieved by tylenol #3). What changed:    when to take this  reasons to take this   zolpidem 10 MG tablet Commonly known as:  AMBIEN Take 10 mg by mouth at bedtime as needed for sleep.      Follow-up Information    [facility].   Why:  referral made and they will follow pt on return to [facility] Contact information: [place] [address] [phone]          Allergies  Allergen Reactions  . Macrodantin Rash  . Penicillins Rash and Other (See Comments)    Many years ago. Has patient had a PCN reaction causing immediate rash, facial/tongue/throat swelling, SOB or lightheadedness with hypotension: unknown Has patient had a PCN reaction  causing severe rash involving mucus membranes or skin necrosis: unknown Has patient had a PCN reaction that required hospitalization: unknown Has patient had a PCN reaction occurring within the last 10 years: No If all of the above answers are "NO", then may proceed with Cephalosporin use.   Marland Kitchen Tolectin [Tolmetin Sodium] Rash    Consultations:  Cardiology    Procedures/Studies: Dg Chest 2 View  Result Date: 09/10/2017 CLINICAL DATA:  84 y/o F; shortness of breath with fluid leaking from the legs for 1 week. EXAM: CHEST - 2 VIEW COMPARISON:  09/07/2017 chest radiograph FINDINGS: Stable cardiomegaly given projection and technique. Two lead pacemaker. Aortic atherosclerosis with calcification. Stable small to moderate bilateral pleural effusions. Mild interstitial edema. Basilar opacities probably represent associated atelectasis. No pneumothorax. No acute osseous abnormality is evident. IMPRESSION: Stable small to moderate pleural effusions and basilar atelectasis. Mild interstitial edema. Electronically Signed   By: Kristine Garbe M.D.   On: 09/10/2017 17:43   Dg Chest 2 View  Result Date: 09/07/2017 CLINICAL DATA:  Bilateral lower extremity edema. EXAM: CHEST - 2 VIEW COMPARISON:  09/02/2017 FINDINGS: Cardiac silhouette is mildly enlarged. No mediastinal or hilar masses. Left anterior chest wall sequential pacemaker is stable. Moderate pleural effusions, increased when compared to the prior radiographs. Lungs are hyperexpanded. Mild basilar atelectasis. No evidence of pneumonia or pulmonary edema. No pneumothorax. Skeletal structures are demineralized but grossly intact. IMPRESSION: 1. Moderate pleural effusions which have increased in size from the prior radiographs. 2. No evidence of pneumonia or pulmonary edema. Electronically Signed   By: Lajean Manes M.D.   On: 09/07/2017 15:56   Dg Chest 2 View  Result Date: 09/02/2017 CLINICAL DATA:  Foot swelling EXAM: CHEST - 2 VIEW  COMPARISON:  04/28/2017 FINDINGS: Left-sided pacing device as before. Small bilateral pleural effusions. Cardiomegaly with aortic atherosclerosis. No pneumothorax. Advanced degenerative changes of both shoulders. IMPRESSION: 1. Small bilateral pleural effusions 2. Cardiomegaly Electronically Signed   By: Donavan Foil M.D.   On: 09/02/2017 21:38   Dg Foot Complete Right  Result Date: 09/02/2017 CLINICAL DATA:  Right foot pain and swelling EXAM: RIGHT FOOT COMPLETE - 3+ VIEW COMPARISON:  None. FINDINGS: No acute displaced fracture is seen. Vascular calcifications. Hallux valgus deformity at the first MTP joint with degenerative changes. Arthritis at the second third and fifth PIP joints and the second through fifth DIP joints. Diffuse degenerative changes at the TMT joints. No bony destruction. No soft  tissue gas. Small plantar calcaneal spur. IMPRESSION: 1. No definite acute osseous abnormality 2. Arthritis within the digits of the foot. Hallux valgus deformity at the first MTP joint Electronically Signed   By: Donavan Foil M.D.   On: 09/02/2017 21:43    Echo Study Conclusions  - Procedure narrative: Transthoracic echocardiography. Image   quality was adequate. The study was technically difficult. - Left ventricle: The cavity size was normal. Wall thickness was   increased in a pattern of mild LVH. Septal-lateral dyssynchrony.   The estimated ejection fraction was 35%. Diffuse hypokinesis.   Features are consistent with a pseudonormal left ventricular   filling pattern, with concomitant abnormal relaxation and   increased filling pressure (grade 2 diastolic dysfunction). - Aortic valve: Probably trileaflet; severely calcified leaflets.   There was severe stenosis. There was mild regurgitation. Mean   gradient (S): 38 mm Hg. Valve area (VTI): 0.44 cm^2. - Mitral valve: Moderately calcified annulus. Mildly calcified   leaflets . There was moderate regurgitation. - Left atrium: The atrium was  mildly dilated. - Right ventricle: Pacer wire or catheter noted in right ventricle.   Systolic function was mildly reduced. - Right atrium: The atrium was mildly dilated. - Tricuspid valve: Peak RV-RA gradient (S): 52 mm Hg. - Systemic veins: IVC not visualized.  Impressions:  - Normal LV size with mild LV hypertrophy. EF 35%, diffuse   hypokinesis with septal-lateral dyssynchrony. Normal RV size with   mildly decreased systolic function. Calcified mitral valve with   moderate mitral regurgitation. Severe aortic stenosis with mild   insufficiency. Moderate pulmonary hypertension.    Discharge Exam: Vitals:   09/14/17 0526 09/14/17 0950  BP: (!) 118/58 (!) 121/57  Pulse: 73 88  Resp: 20   Temp: 97.6 F (36.4 C)   SpO2: 94%     General: Pt is alert, awake, not in acute distress, thin  Cardiovascular: RRR, S1/S2 +, no rubs, no gallops Respiratory: CTA bilaterally, no wheezing, no rhonchi Abdominal: Soft, NT, ND, bowel sounds + Extremities: no edema, no cyanosis    The results of significant diagnostics from this hospitalization (including imaging, microbiology, ancillary and laboratory) are listed below for reference.     Microbiology: No results found for this or any previous visit (from the past 240 hour(s)).   Labs: BNP (last 3 results) Recent Labs    09/07/17 1613 09/10/17 1549  BNP >4,500.0* >1,610.9*   Basic Metabolic Panel: Recent Labs  Lab 09/10/17 1549 09/11/17 0707 09/12/17 0717 09/13/17 0516 09/14/17 0500  NA 135 137 139 138 139  K 3.9 3.3* 3.1* 3.9 3.5  CL 101 98* 96* 93* 95*  CO2 23 24 32 32 32  GLUCOSE 127* 100* 106* 95 100*  BUN 38* 38* 40* 42* 38*  CREATININE 1.12* 1.08* 1.21* 1.10* 1.30*  CALCIUM 9.3 9.1 8.8* 9.1 9.1  MG  --  1.4* 1.4* 2.3 1.7  PHOS  --  3.6  --   --   --    Liver Function Tests: Recent Labs  Lab 09/07/17 1613 09/10/17 2108 09/11/17 0707  AST 39 37 35  ALT 48 37 35  ALKPHOS 124 108 104  BILITOT 0.6 0.8 1.0   PROT 5.6* 5.2* 5.2*  ALBUMIN 3.5 3.2* 3.4*   No results for input(s): LIPASE, AMYLASE in the last 168 hours. No results for input(s): AMMONIA in the last 168 hours. CBC: Recent Labs  Lab 09/07/17 1613 09/10/17 1549 09/11/17 0707  WBC 7.9 7.4 8.2  NEUTROABS 5.9  --   --  HGB 14.2 13.7 14.0  HCT 43.9 42.9 42.3  MCV 82.8 82.5 82.1  PLT 254 255 231   Cardiac Enzymes: Recent Labs  Lab 09/10/17 2108 09/11/17 0238 09/11/17 0707  TROPONINI 0.04* 0.05* 0.04*   BNP: Invalid input(s): POCBNP CBG: No results for input(s): GLUCAP in the last 168 hours. D-Dimer No results for input(s): DDIMER in the last 72 hours. Hgb A1c No results for input(s): HGBA1C in the last 72 hours. Lipid Profile No results for input(s): CHOL, HDL, LDLCALC, TRIG, CHOLHDL, LDLDIRECT in the last 72 hours. Thyroid function studies No results for input(s): TSH, T4TOTAL, T3FREE, THYROIDAB in the last 72 hours.  Invalid input(s): FREET3 Anemia work up No results for input(s): VITAMINB12, FOLATE, FERRITIN, TIBC, IRON, RETICCTPCT in the last 72 hours. Urinalysis    Component Value Date/Time   COLORURINE YELLOW 09/03/2017 1513   APPEARANCEUR CLEAR 09/03/2017 1513   LABSPEC 1.017 09/03/2017 1513   PHURINE 5.0 09/03/2017 1513   GLUCOSEU NEGATIVE 09/03/2017 1513   HGBUR NEGATIVE 09/03/2017 1513   BILIRUBINUR NEGATIVE 09/03/2017 1513   KETONESUR NEGATIVE 09/03/2017 1513   PROTEINUR 30 (A) 09/03/2017 1513   UROBILINOGEN 0.2 03/01/2015 0932   NITRITE NEGATIVE 09/03/2017 1513   LEUKOCYTESUR NEGATIVE 09/03/2017 1513   Sepsis Labs Invalid input(s): PROCALCITONIN,  WBC,  LACTICIDVEN Microbiology No results found for this or any previous visit (from the past 240 hour(s)).   Patient was seen and examined on the day of discharge and was found to be in stable condition. Time coordinating discharge: 25 minutes including assessment and coordination of care, as well as examination of the patient.    SIGNED:  Dessa Phi, DO [facility] Pager [phone]  If 7PM-7AM, please contact night-coverage [email or web address] Password [email or web address] 09/14/2017, 12:00 PM

## 2017-09-14 NOTE — Progress Notes (Signed)
Call placed to CCMD to notify of telemetry monitoring d/c.   

## 2017-09-14 NOTE — Clinical Social Work Placement (Signed)
   CLINICAL SOCIAL WORK PLACEMENT  NOTE  Date:  09/14/2017  Patient Details  Name: Erin Charles MRN: [id number] Date of Birth: 09/26/1922  Clinical Social Work is seeking post-discharge placement for this patient at the [place] level of care (*CSW will initial, date and re-position this form in  chart as items are completed):  Yes   Patient/family provided with [facility] Work Department's list of facilities offering this level of care within the geographic area requested by the patient (or if unable, by the patient's family).  Yes   Patient/family informed of their freedom to choose among providers that offer the needed level of care, that participate in Medicare, Medicaid or managed care program needed by the patient, have an available bed and are willing to accept the patient.  Yes   Patient/family informed of [facility]'s ownership interest in [facility] and [facility], as well as of the fact that they are under no obligation to receive care at these facilities.  PASRR submitted to EDS on       PASRR number received on       Existing PASRR number confirmed on       FL2 transmitted to all facilities in geographic area requested by pt/family on       FL2 transmitted to all facilities within larger geographic area on       Patient informed that his/her managed care company has contracts with or will negotiate with certain facilities, including the following:            Patient/family informed of bed offers received.  Patient chooses bed at [facility])     Physician recommends and patient chooses bed at      Patient to be transferred to [facility]) on 09/14/17.  Patient to be transferred to facility by family     Patient family notified on 09/14/17 of transfer.  Name of family member notified:  family at bedside     PHYSICIAN       Additional Comment:    _______________________________________________ Wende Neighbors,  LCSW 09/14/2017, 12:47 PM

## 2017-09-14 NOTE — Progress Notes (Signed)
Pt escorted to vehicle with husband/daughter via personal wheelchair. Pt belongings with family. Pt denies complaints/needs. Discharge summary provided to daughter. Daughter will transport pt to AL at [facility]

## 2017-09-14 NOTE — Consult Note (Signed)
[facility]--Met with pt, and dtr--Carol Coke, and husband Lotoya Casella at the bedside today.  Reviewed Hospice services back at [facility].  Discussed the comfort approach to care under the Hospice Medicare Benefit, multidisciplinary team approach to care, covered DME, covered med supplies, covered med formulary.  All are in agreement with the plan for Hospice support when she returns to [place].  Pt has been approved by Hospice MD for Hospice services.  Pt is eager to go home today.  Thank you for the referral and opportunity to serve pt and family at end-of-life.  Wynetta Fines, RN

## 2017-09-14 NOTE — Progress Notes (Signed)
Page to Dr Maylene Roes   3e05 pt is set up with hospice once she arrives to [place]. family at bedside.

## 2017-09-14 NOTE — Progress Notes (Signed)
Attempted to call report to [facility], [facility] is not available to receive report at this time. They requested an attempt to callback be made in 15 minutes.

## 2017-09-14 NOTE — Progress Notes (Signed)
Clinical Social Worker facilitated patient discharge including contacting patient family and facility to confirm patient discharge plans.  Clinical information faxed to facility and family agreeable with plan. RN stated that family will transport patient back to [facility] on [address] via personal vehicle .  RN to call [phone] and ask to speak to a Med Tech for report prior to discharge.  Clinical Social Worker will sign off for now as social work intervention is no longer needed. Please consult [place] again if new need arises.  Rhea Pink, MSW, [place]

## 2017-10-03 ENCOUNTER — Ambulatory Visit: Payer: Federal, State, Local not specified - PPO | Admitting: Physical Medicine & Rehabilitation

## 2017-10-03 ENCOUNTER — Ambulatory Visit: Payer: Federal, State, Local not specified - PPO

## 2018-03-30 NOTE — Progress Notes (Deleted)
Cardiology Office Note   Date:  03/30/2018   ID:  [name], DOB 1923-05-03, MRN [id number]  PCP:  Hulan Fess, MD  Cardiologist:  Dr. Acie Fredrickson  No chief complaint on file.    History of Present Illness: Erin Charles is a 84 y.o. female who presents for ongoing assessment and management of AoV stenosis, but declined TAVR, mild non-obstructive CAD by cath 2016, CHF s/p ST, Jude PPM 2012, PAF on Eliquis, Systolic CHF. Due to critical AS she was to avoid over diuresis to avoid significant drop in renal perfusion. She was continued on po lasix. Other history includes chronic kidney disease, and dementia.    Past Medical History:  Diagnosis Date  . Advanced age   . Atrial flutter ([place])   . Chronic combined systolic and diastolic CHF (congestive heart failure) ([place])   . Chronic pain    a. followed in pain mgmt.  . CKD (chronic kidney disease), stage III ([place])   . Complete heart block ([place])    Pacer  . 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 Dyslipidemia   . HTN (hypertension)   . Macular degeneration   . Mild CAD    a. LHC 08/2014: 40% D1, 30% LM.  . Mild dementia   . OA (osteoarthritis)   . PAF (paroxysmal atrial fibrillation) ([place])   . RBBB (right bundle branch block)   . Severe aortic stenosis    a. pt has declined TAVR.    Past Surgical History:  Procedure Laterality Date  . EYE SURGERY    . HERNIA REPAIR    . INSERT / REPLACE / REMOVE PACEMAKER  08/30/10   DUAL CHAMBER/ST. JUDE  . LEFT AND RIGHT HEART CATHETERIZATION WITH CORONARY ANGIOGRAM N/A 08/09/2014   Procedure: LEFT AND RIGHT HEART CATHETERIZATION WITH CORONARY ANGIOGRAM;  Surgeon: Burnell Blanks, MD;  Location: [facility] CATH LAB;  Service: Cardiovascular;  Laterality: N/A;  . PACEMAKER INSERTION    . PILONIDAL CYST EXCISION       Current Outpatient Medications  Medication Sig Dispense Refill  . acetaminophen (TYLENOL) 325 MG tablet Take 650 mg by mouth every 8 (eight) hours as  needed for mild pain.     Marland Kitchen ALPRAZolam (XANAX) 0.25 MG tablet Take 1 tablet (0.25 mg total) by mouth every 8 (eight) hours as needed for anxiety. 10 tablet 0  . anastrozole (ARIMIDEX) 1 MG tablet Take 1 tablet (1 mg total) daily by mouth. 90 tablet 1  . ELIQUIS 2.5 MG TABS tablet TAKE 1 TABLET BY MOUTH TWICE A DAY 180 tablet 1  . furosemide (LASIX) 40 MG tablet Take 1 tablet (40 mg total) by mouth 2 (two) times daily. 60 tablet 0  . lactobacillus acidophilus & bulgar (LACTINEX) chewable tablet Chew 1 tablet by mouth 3 (three) times daily with meals. 90 tablet 0  . metoprolol succinate (TOPROL-XL) 50 MG 24 hr tablet TAKE 1 TABLET BY MOUTH DAILY 30 tablet 9  . NITROSTAT 0.4 MG SL tablet PLACE 1 TABLET (0.4 MG TOTAL) UNDER THE TONGUE EVERY 5 (FIVE) MINUTES AS NEEDED FOR CHEST PAIN. 25 tablet 3  . potassium chloride (K-DUR,KLOR-CON) 10 MEQ tablet Take 10 mEq by mouth daily.     . quinapril (ACCUPRIL) 40 MG tablet Take 40 mg by mouth 2 (two) times daily.     . simvastatin (ZOCOR) 40 MG tablet Take 40 mg by mouth at bedtime.     . traMADol (ULTRAM) 50 MG tablet Take 1 tablet (  50 mg total) by mouth every 8 (eight) hours as needed for moderate pain or severe pain (not relieved by tylenol #3). (Patient taking differently: Take 50 mg by mouth every 6 (six) hours as needed for moderate pain. ) 15 tablet 0  . zolpidem (AMBIEN) 10 MG tablet Take 10 mg by mouth at bedtime as needed for sleep.      No current facility-administered medications for this visit.     Allergies:   Macrodantin; Penicillins; and Tolectin [tolmetin sodium]    Social History:  The patient  reports that she has never smoked. She has never used smokeless tobacco. She reports that she does not drink alcohol or use drugs.   Family History:  The patient's family history includes Cancer in her mother; Heart attack in her sister; Heart failure in her father.    ROS: All other systems are reviewed and negative. Unless otherwise mentioned in  H&P    PHYSICAL EXAM: VS:  There were no vitals taken for this visit. , BMI There is no height or weight on file to calculate BMI. GEN: Well nourished, well developed, in no acute distress HEENT: normal Neck: no JVD, carotid bruits, or masses Cardiac: ***RRR; no murmurs, rubs, or gallops,no edema  Respiratory:  Clear to auscultation bilaterally, normal work of breathing GI: soft, nontender, nondistended, + BS MS: no deformity or atrophy Skin: warm and dry, no rash Neuro:  Strength and sensation are intact Psych: euthymic mood, full affect   EKG:  EKG {ACTION; IS/IS [id number]} ordered today. The ekg ordered today demonstrates ***   Recent Labs: 09/10/2017: B Natriuretic Peptide >4,500.0; TSH 1.312 09/11/2017: ALT 35; Hemoglobin 14.0; Platelets 231 09/14/2017: BUN 38; Creatinine, Ser 1.30; Magnesium 1.7; Potassium 3.5; Sodium 139    Lipid Panel    Component Value Date/Time   CHOL 131 08/13/2016 1140   TRIG 147 08/13/2016 1140   HDL 52 08/13/2016 1140   CHOLHDL 2.5 08/13/2016 1140   CHOLHDL 3.1 08/04/2011 0610   VLDL 31 08/04/2011 0610   LDLCALC 50 08/13/2016 1140      Wt Readings from Last 3 Encounters:  09/14/17 97 lb (44 kg)  09/03/17 109 lb 12.6 oz (49.8 kg)  06/11/17 110 lb 12.8 oz (50.3 kg)      Other studies Reviewed: Additional studies/ records that were reviewed today include: ***. Review of the above records demonstrates: ***   ASSESSMENT AND PLAN:  1.  ***   Current medicines are reviewed at length with the patient today.    Labs/ tests ordered today include: *** Phill Myron. [place] Pugh, ANP, AACC   03/30/2018 8:07 AM    [facility] [place] [address] Office [phone] Fax [phone]

## 2018-04-01 ENCOUNTER — Encounter

## 2018-04-01 ENCOUNTER — Ambulatory Visit: Payer: Federal, State, Local not specified - PPO | Admitting: Adult Health

## 2018-04-23 ENCOUNTER — Encounter

## 2018-04-23 ENCOUNTER — Ambulatory Visit: Payer: Federal, State, Local not specified - PPO | Admitting: Cardiovascular Disease

## 2018-05-31 DEATH — deceased

## 2019-06-09 IMAGING — DX DG FOOT COMPLETE 3+V*R*
3 series · 3 of 3 positions shown · non-contrast
Comparison: None.

CLINICAL DATA: Right foot pain and swelling

EXAM:
RIGHT FOOT COMPLETE - 3+ VIEW

[foot ap]
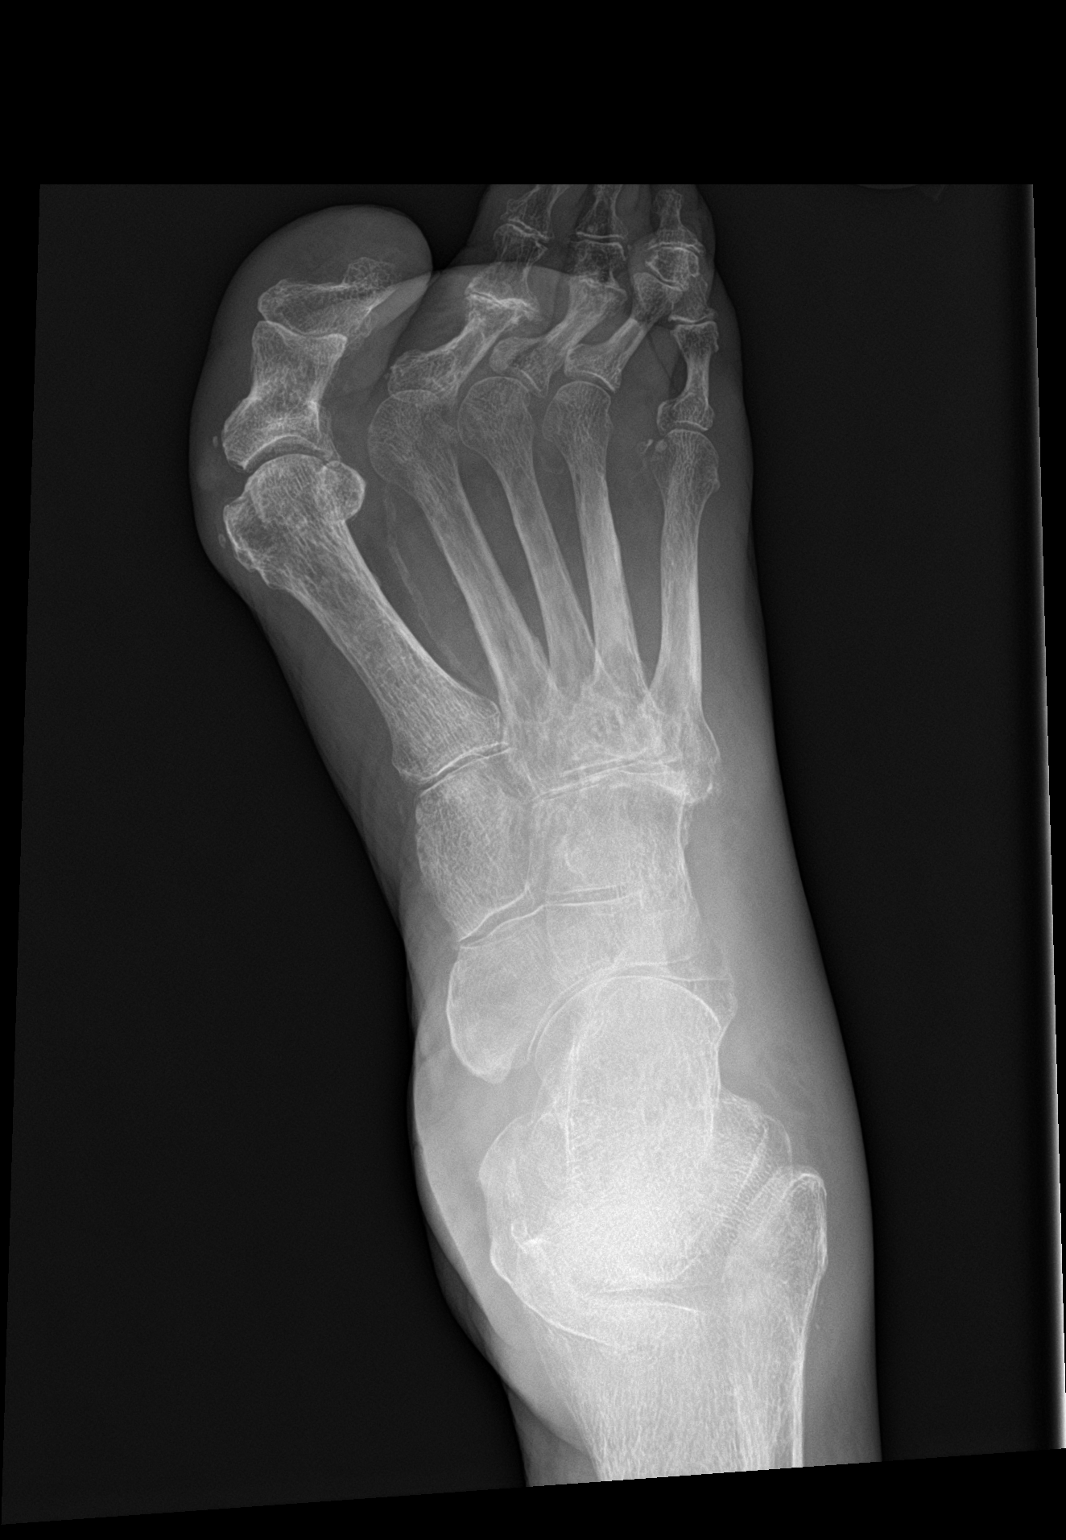

[foot obl]
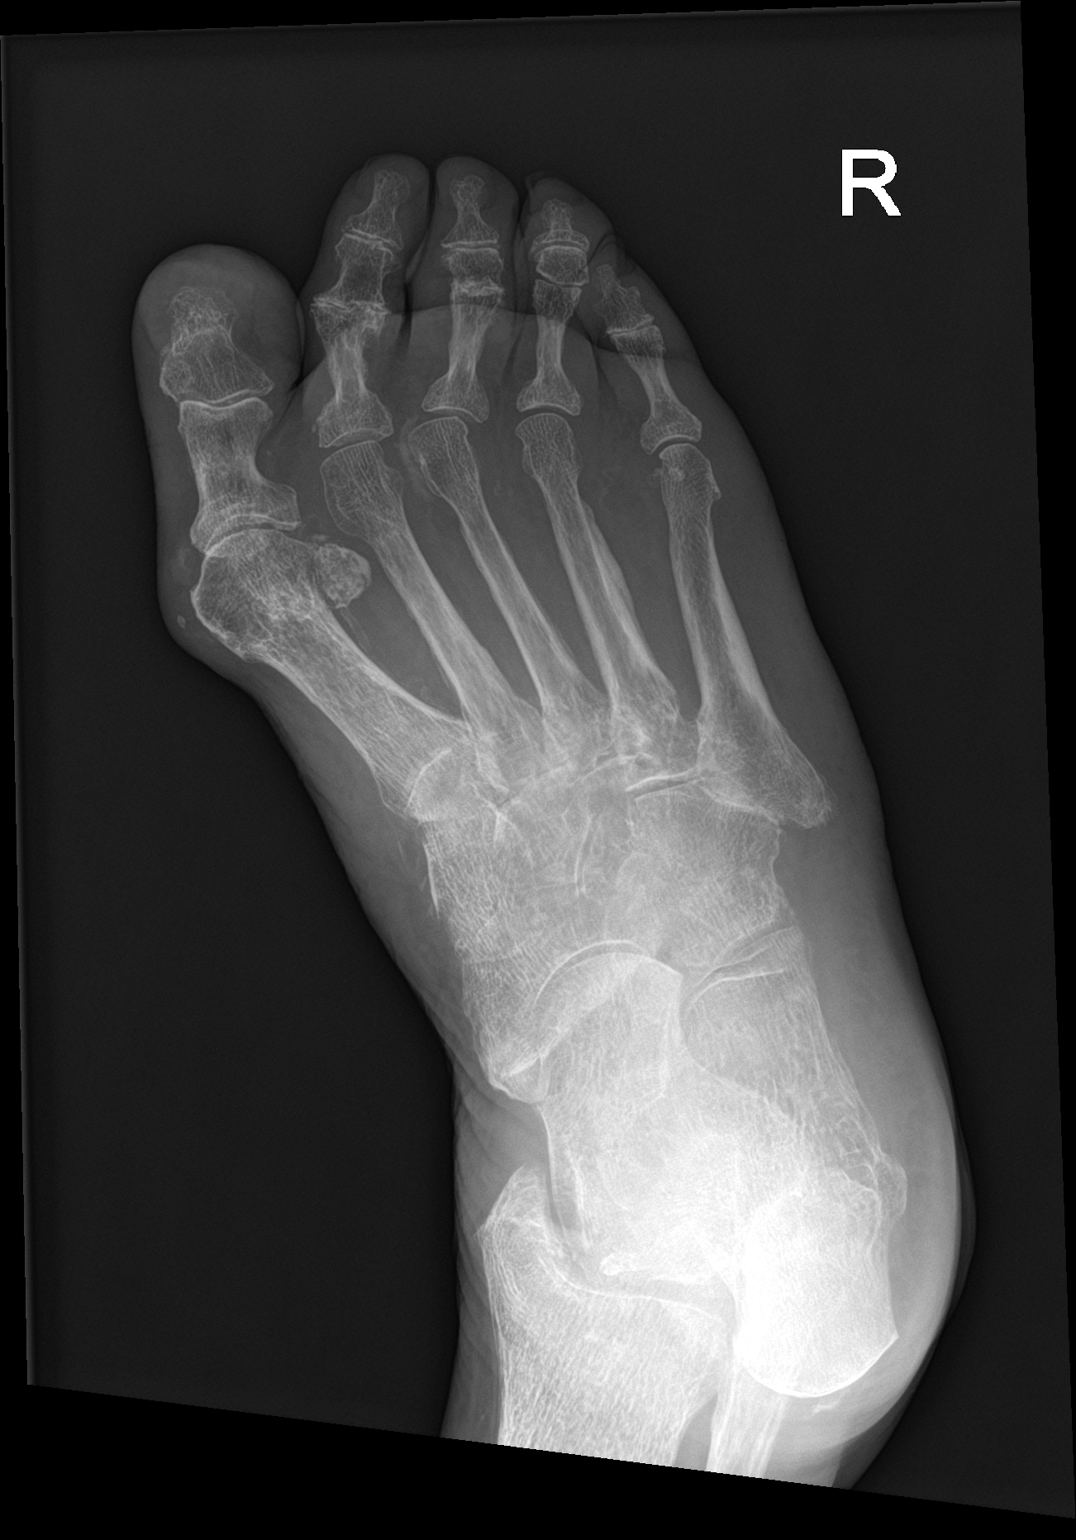

[foot lat]
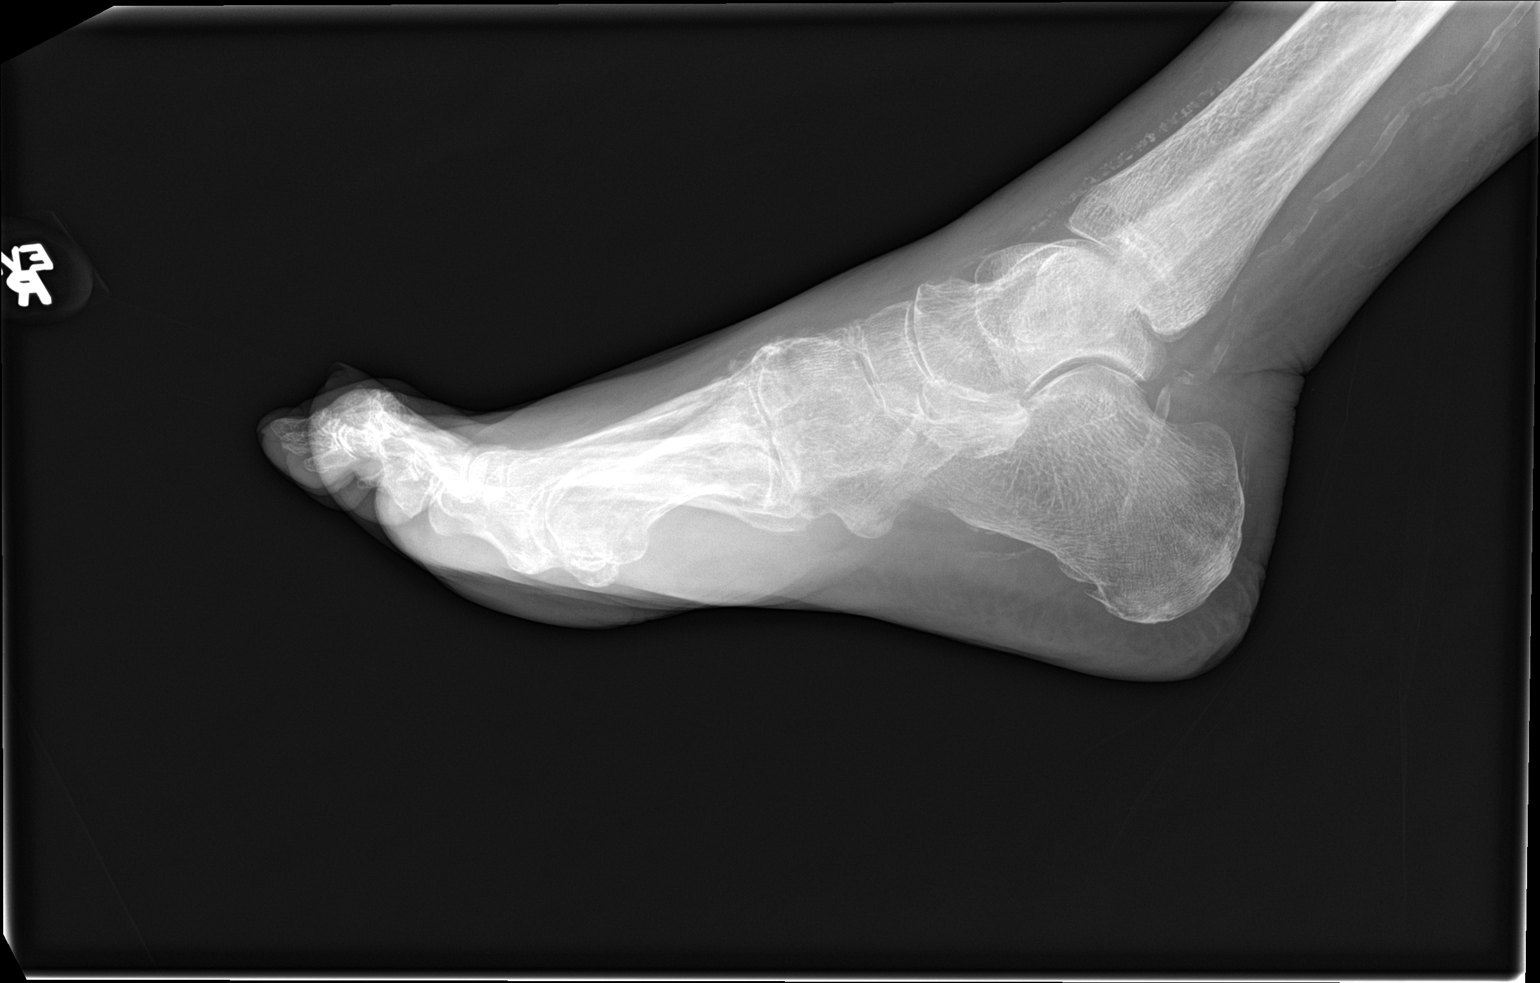

[3 of 3 positions shown; findings below may reference images not displayed]

FINDINGS: No acute displaced fracture is seen. Vascular calcifications. Hallux
valgus deformity at the first MTP joint with degenerative changes.
Arthritis at the second third and fifth PIP joints and the second
through fifth DIP joints. Diffuse degenerative changes at the TMT
joints. No bony destruction. No soft tissue gas. Small plantar
calcaneal spur.
IMPRESSION: 1. No definite acute osseous abnormality
2. Arthritis within the digits of the foot. Hallux valgus deformity
at the first MTP joint

## 2019-06-09 IMAGING — DX DG CHEST 2V
2 series · 2 of 2 positions shown · non-contrast
Comparison: 04/28/2017

CLINICAL DATA: Foot swelling

EXAM:
CHEST - 2 VIEW

[chest lat]
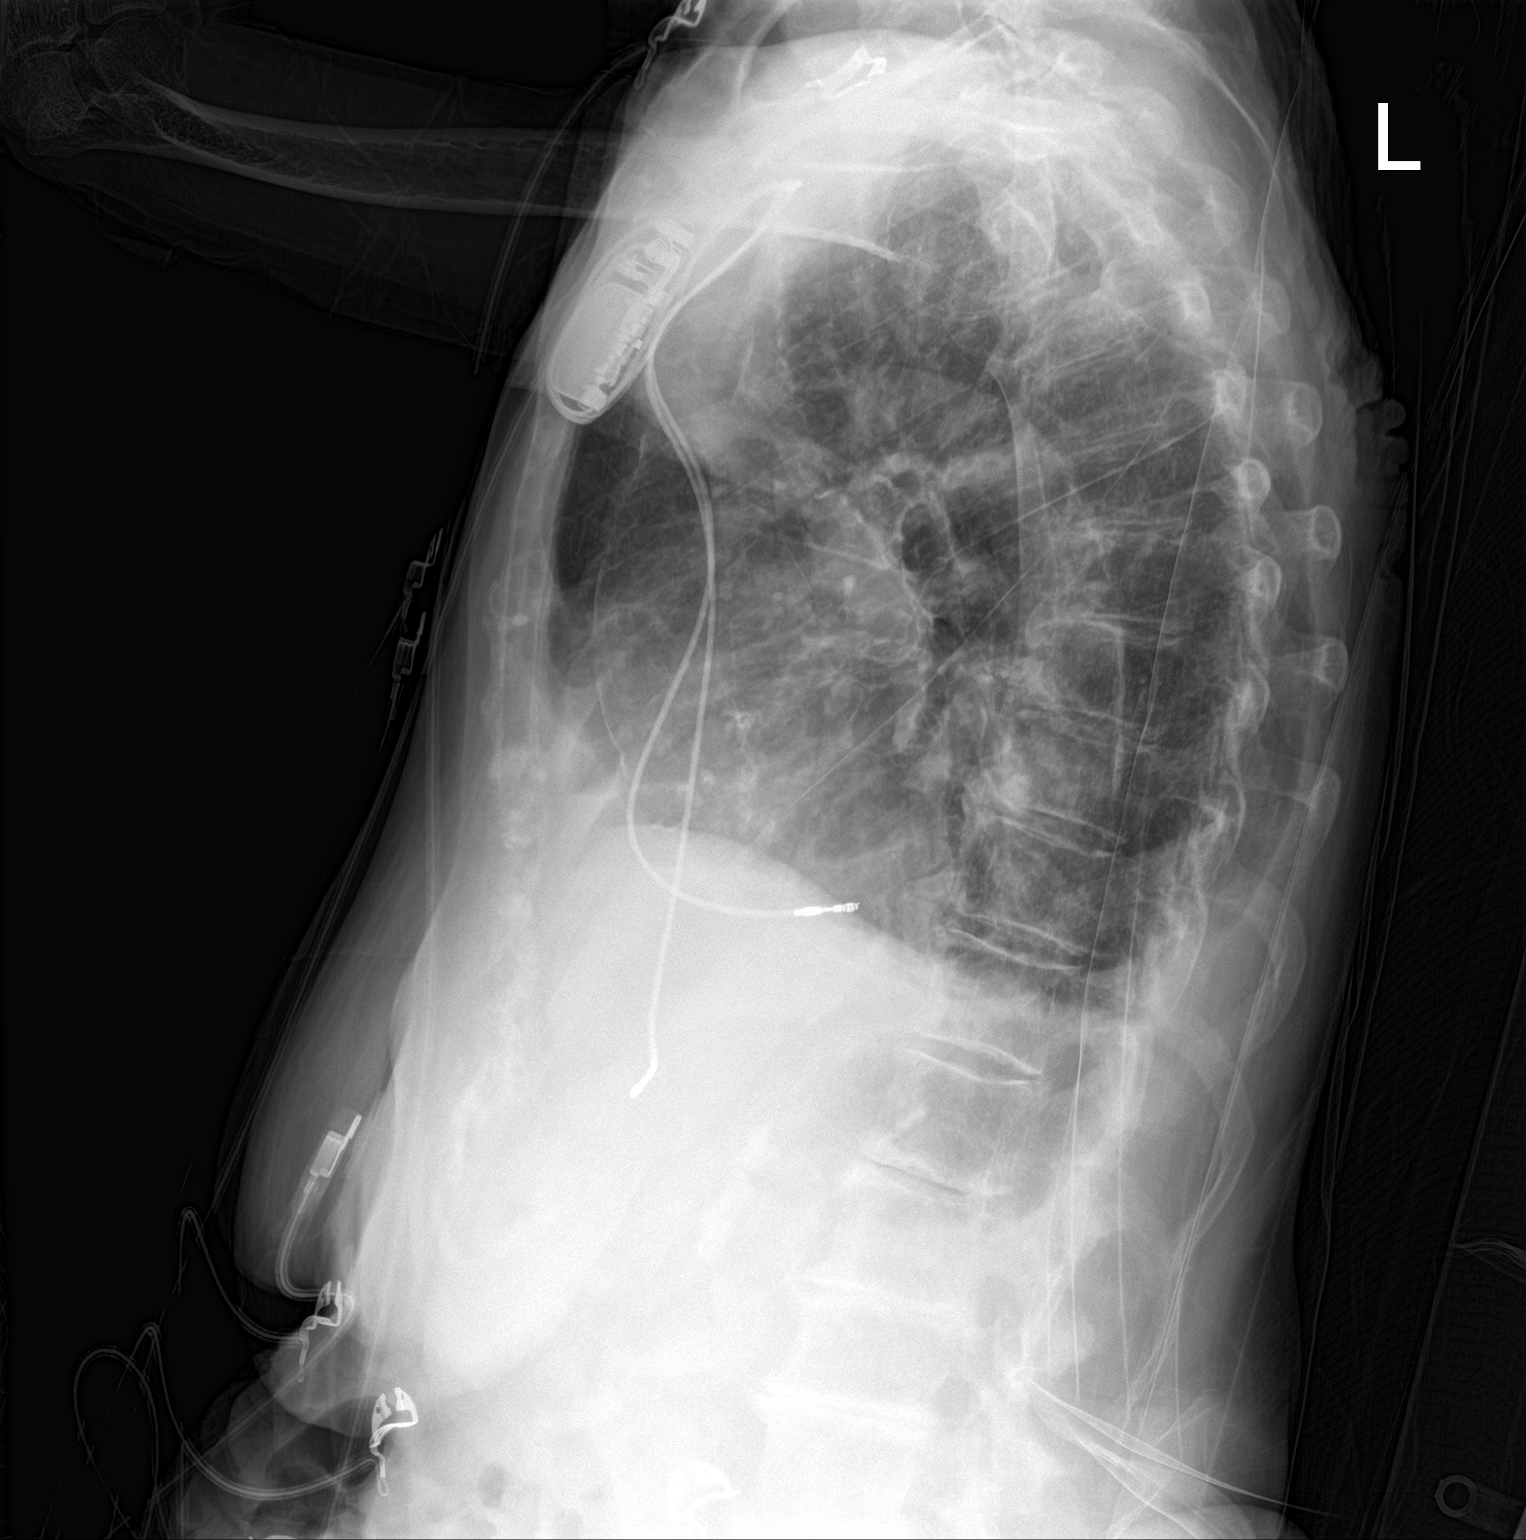

[chest ap]
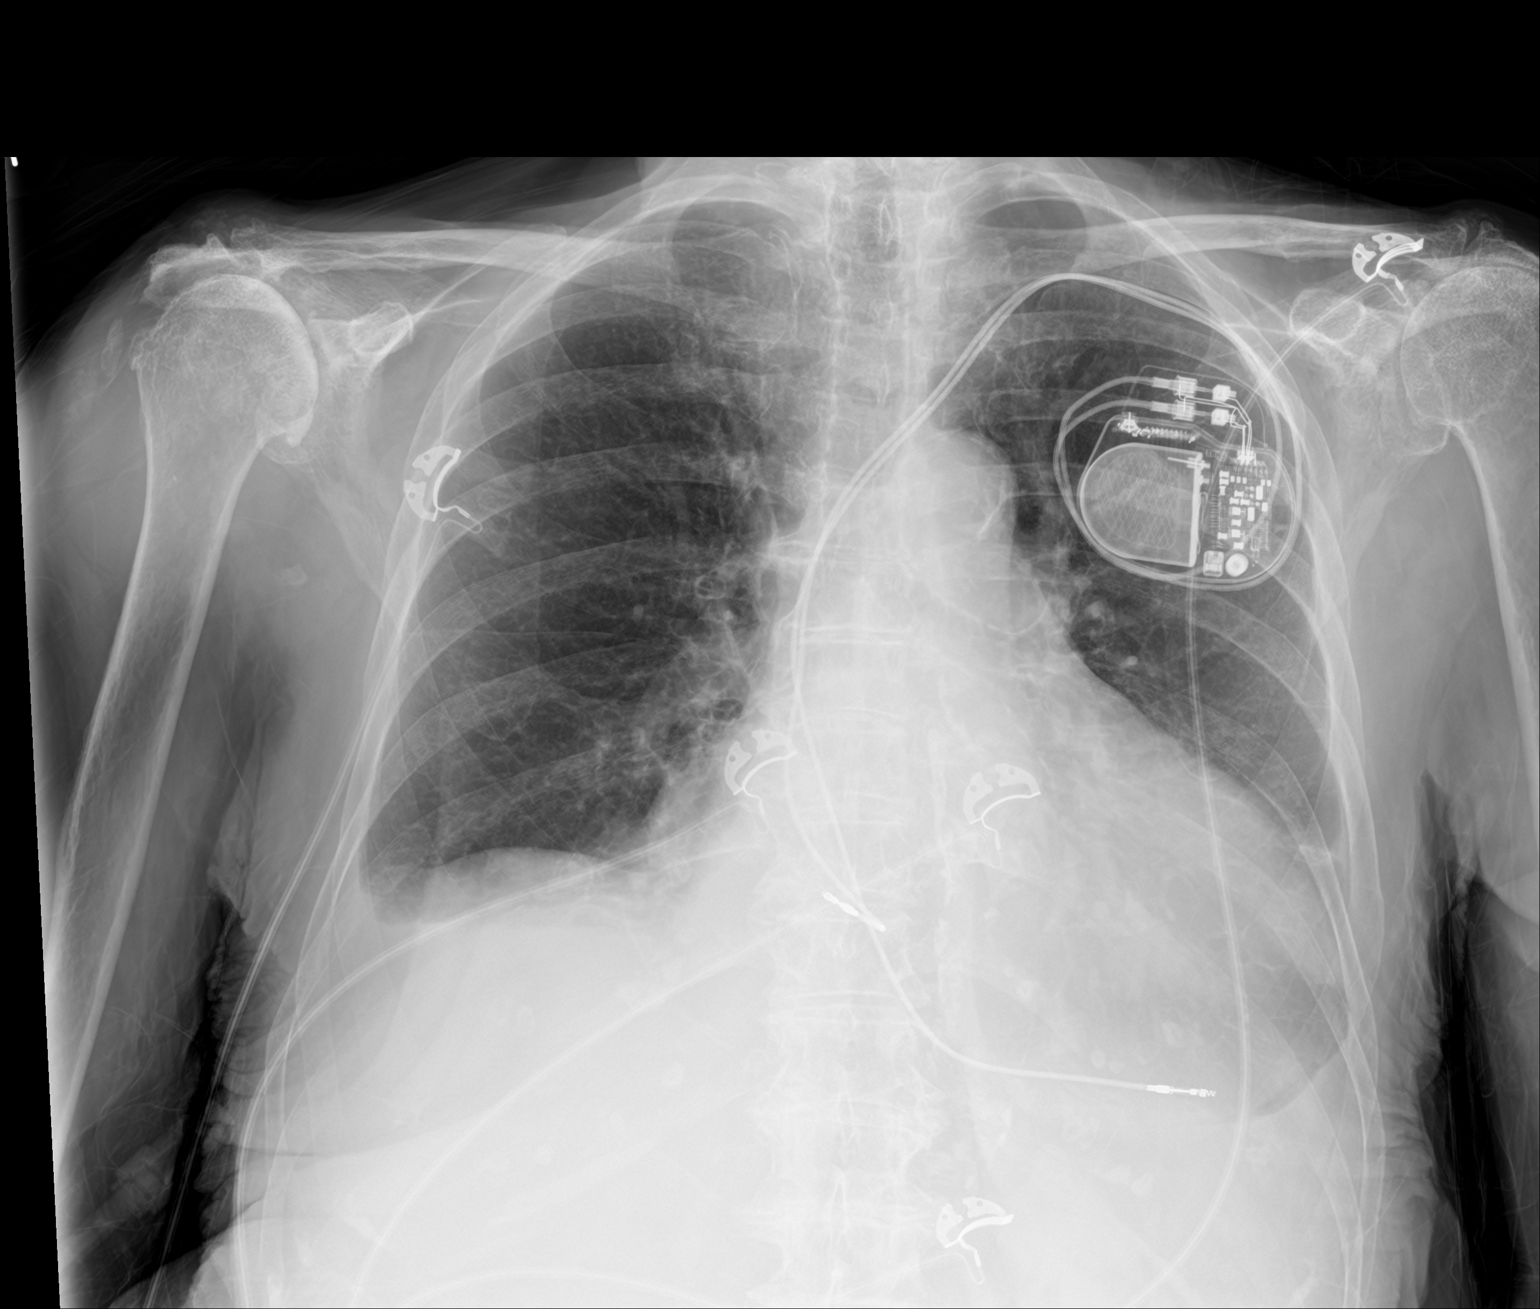

[2 of 2 positions shown; findings below may reference images not displayed]

FINDINGS: Left-sided pacing device as before. Small bilateral pleural
effusions. Cardiomegaly with aortic atherosclerosis. No
pneumothorax. Advanced degenerative changes of both shoulders.
IMPRESSION: 1. Small bilateral pleural effusions
2. Cardiomegaly
